# Patient Record
Sex: Female | Born: 1935
Health system: Southern US, Community
[De-identification: ages and names within clinical notes are randomized; demographics above are authoritative.]

## PROBLEM LIST (undated history)

## (undated) DIAGNOSIS — R569 Unspecified convulsions: Secondary | ICD-10-CM

## (undated) DIAGNOSIS — M5418 Radiculopathy, sacral and sacrococcygeal region: Secondary | ICD-10-CM

## (undated) DIAGNOSIS — H9190 Unspecified hearing loss, unspecified ear: Secondary | ICD-10-CM

## (undated) DIAGNOSIS — G63 Polyneuropathy in diseases classified elsewhere: Secondary | ICD-10-CM

## (undated) DIAGNOSIS — M1990 Unspecified osteoarthritis, unspecified site: Secondary | ICD-10-CM

## (undated) DIAGNOSIS — M545 Low back pain, unspecified: Secondary | ICD-10-CM

## (undated) DIAGNOSIS — D472 Monoclonal gammopathy: Secondary | ICD-10-CM

## (undated) DIAGNOSIS — G40909 Epilepsy, unspecified, not intractable, without status epilepticus: Secondary | ICD-10-CM

## (undated) DIAGNOSIS — Z87898 Personal history of other specified conditions: Secondary | ICD-10-CM

## (undated) DIAGNOSIS — R413 Other amnesia: Secondary | ICD-10-CM

## (undated) DIAGNOSIS — I10 Essential (primary) hypertension: Secondary | ICD-10-CM

## (undated) HISTORY — PX: TRABECULECTOMY: SHX107

## (undated) HISTORY — DX: Radiculopathy, sacral and sacrococcygeal region: M54.18

## (undated) HISTORY — DX: Monoclonal gammopathy: D47.2

## (undated) HISTORY — DX: Unspecified hearing loss, unspecified ear: H91.90

## (undated) HISTORY — PX: PARATHYROIDECTOMY: SHX19

## (undated) HISTORY — DX: Low back pain, unspecified: M54.50

## (undated) HISTORY — PX: BACK SURGERY: SHX140

## (undated) HISTORY — PX: APPENDECTOMY: SHX54

## (undated) HISTORY — DX: Other amnesia: R41.3

## (undated) HISTORY — DX: Unspecified osteoarthritis, unspecified site: M19.90

## (undated) HISTORY — DX: Essential (primary) hypertension: I10

## (undated) HISTORY — DX: Low back pain: M54.5

## (undated) HISTORY — DX: Epilepsy, unspecified, not intractable, without status epilepticus: G40.909

## (undated) HISTORY — DX: Polyneuropathy in diseases classified elsewhere: G63

## (undated) HISTORY — PX: OVARY SURGERY: SHX727

## (undated) HISTORY — PX: CATARACT EXTRACTION: SUR2

## (undated) HISTORY — DX: Personal history of other specified conditions: Z87.898

---
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
## 1999-01-27 ENCOUNTER — Other Ambulatory Visit: Admission: RE | Admit: 1999-01-27 | Discharge: 1999-01-27 | Payer: Self-pay | Admitting: Surgery

## 1999-02-06 ENCOUNTER — Ambulatory Visit (HOSPITAL_COMMUNITY): Admission: RE | Admit: 1999-02-06 | Discharge: 1999-02-06 | Payer: Self-pay | Admitting: Surgery

## 1999-04-06 ENCOUNTER — Other Ambulatory Visit: Admission: RE | Admit: 1999-04-06 | Discharge: 1999-04-06 | Payer: Self-pay | Admitting: Obstetrics and Gynecology

## 1999-10-02 ENCOUNTER — Encounter (INDEPENDENT_AMBULATORY_CARE_PROVIDER_SITE_OTHER): Payer: Self-pay

## 1999-10-02 ENCOUNTER — Other Ambulatory Visit: Admission: RE | Admit: 1999-10-02 | Discharge: 1999-10-02 | Payer: Self-pay | Admitting: Obstetrics and Gynecology

## 2000-01-01 ENCOUNTER — Ambulatory Visit (HOSPITAL_COMMUNITY): Admission: RE | Admit: 2000-01-01 | Discharge: 2000-01-01 | Payer: Self-pay | Admitting: Obstetrics & Gynecology

## 2001-05-19 ENCOUNTER — Other Ambulatory Visit: Admission: RE | Admit: 2001-05-19 | Discharge: 2001-05-19 | Payer: Self-pay | Admitting: Obstetrics and Gynecology

## 2001-06-26 ENCOUNTER — Encounter: Payer: Self-pay | Admitting: Family Medicine

## 2001-06-26 ENCOUNTER — Encounter: Admission: RE | Admit: 2001-06-26 | Discharge: 2001-06-26 | Payer: Self-pay | Admitting: Family Medicine

## 2003-06-15 ENCOUNTER — Encounter: Payer: Self-pay | Admitting: Family Medicine

## 2003-06-15 ENCOUNTER — Encounter: Admission: RE | Admit: 2003-06-15 | Discharge: 2003-06-15 | Payer: Self-pay | Admitting: Family Medicine

## 2003-08-20 ENCOUNTER — Encounter: Admission: RE | Admit: 2003-08-20 | Discharge: 2003-08-20 | Payer: Self-pay | Admitting: Family Medicine

## 2003-08-20 ENCOUNTER — Encounter: Payer: Self-pay | Admitting: Family Medicine

## 2003-10-07 ENCOUNTER — Encounter: Admission: RE | Admit: 2003-10-07 | Discharge: 2003-10-07 | Payer: Self-pay | Admitting: Neurosurgery

## 2003-10-08 ENCOUNTER — Other Ambulatory Visit: Admission: RE | Admit: 2003-10-08 | Discharge: 2003-10-08 | Payer: Self-pay | Admitting: Family Medicine

## 2003-10-26 ENCOUNTER — Encounter: Admission: RE | Admit: 2003-10-26 | Discharge: 2003-10-26 | Payer: Self-pay | Admitting: Neurosurgery

## 2003-12-09 ENCOUNTER — Inpatient Hospital Stay (HOSPITAL_COMMUNITY): Admission: RE | Admit: 2003-12-09 | Discharge: 2003-12-10 | Payer: Self-pay | Admitting: Neurosurgery

## 2003-12-13 ENCOUNTER — Inpatient Hospital Stay (HOSPITAL_COMMUNITY): Admission: EM | Admit: 2003-12-13 | Discharge: 2003-12-18 | Payer: Self-pay | Admitting: Neurosurgery

## 2003-12-13 ENCOUNTER — Emergency Department (HOSPITAL_COMMUNITY): Admission: EM | Admit: 2003-12-13 | Discharge: 2003-12-13 | Payer: Self-pay | Admitting: *Deleted

## 2004-10-16 ENCOUNTER — Other Ambulatory Visit: Admission: RE | Admit: 2004-10-16 | Discharge: 2004-10-16 | Payer: Self-pay | Admitting: Family Medicine

## 2006-05-14 ENCOUNTER — Encounter: Admission: RE | Admit: 2006-05-14 | Discharge: 2006-05-14 | Payer: Self-pay | Admitting: Family Medicine

## 2006-05-24 ENCOUNTER — Encounter: Admission: RE | Admit: 2006-05-24 | Discharge: 2006-05-24 | Payer: Self-pay | Admitting: Family Medicine

## 2007-08-08 ENCOUNTER — Emergency Department (HOSPITAL_COMMUNITY): Admission: EM | Admit: 2007-08-08 | Discharge: 2007-08-08 | Payer: Self-pay | Admitting: Emergency Medicine

## 2007-09-11 ENCOUNTER — Encounter: Admission: RE | Admit: 2007-09-11 | Discharge: 2007-09-11 | Payer: Self-pay | Admitting: Family Medicine

## 2007-10-13 ENCOUNTER — Ambulatory Visit: Payer: Self-pay | Admitting: Gastroenterology

## 2007-10-13 LAB — CONVERTED CEMR LAB
Ferritin: 31.5 ng/mL
Folate: >20 ng/mL
Iron: 172 ug/dL — ABNORMAL HIGH
Saturation Ratios: 68.4 % — ABNORMAL HIGH
Sed Rate: 5 mm/h
Tissue Transglutaminase Ab, IgA: 0 units (ref <7)
Transferrin: 179.7 mg/dL — ABNORMAL LOW
Vitamin B-12: 551 pg/mL

## 2007-10-16 ENCOUNTER — Ambulatory Visit (HOSPITAL_COMMUNITY): Admission: RE | Admit: 2007-10-16 | Discharge: 2007-10-16 | Payer: Self-pay | Admitting: Gastroenterology

## 2007-11-05 ENCOUNTER — Ambulatory Visit: Payer: Self-pay | Admitting: Gastroenterology

## 2007-11-10 ENCOUNTER — Ambulatory Visit: Payer: Self-pay | Admitting: Internal Medicine

## 2007-11-14 ENCOUNTER — Ambulatory Visit: Payer: Self-pay | Admitting: Gastroenterology

## 2007-11-14 LAB — CONVERTED CEMR LAB
AST: 30 units/L (ref 0–37)
Albumin: 3.3 g/dL — ABNORMAL LOW (ref 3.5–5.2)
CA 125: 13 units/mL (ref 0.0–30.2)
Total Bilirubin: 0.8 mg/dL (ref 0.3–1.2)
Total Protein: 6.2 g/dL (ref 6.0–8.3)

## 2007-12-08 ENCOUNTER — Ambulatory Visit: Payer: Self-pay | Admitting: Oncology

## 2007-12-30 LAB — CBC WITH DIFFERENTIAL/PLATELET
Basophils Absolute: 0.1 10*3/uL (ref 0.0–0.1)
EOS%: 5 % (ref 0.0–7.0)
Eosinophils Absolute: 0.3 10*3/uL (ref 0.0–0.5)
HGB: 13.2 g/dL (ref 11.6–15.9)
MONO#: 0.5 10*3/uL (ref 0.1–0.9)
NEUT#: 2.6 10*3/uL (ref 1.5–6.5)
RDW: 10.7 % — ABNORMAL LOW (ref 11.3–14.5)
WBC: 5 10*3/uL (ref 3.9–10.0)
lymph#: 1.6 10*3/uL (ref 0.9–3.3)

## 2007-12-30 LAB — MORPHOLOGY: PLT EST: ADEQUATE

## 2007-12-30 LAB — CHCC SMEAR

## 2008-01-01 ENCOUNTER — Ambulatory Visit (HOSPITAL_COMMUNITY): Admission: RE | Admit: 2008-01-01 | Discharge: 2008-01-01 | Payer: Self-pay | Admitting: Oncology

## 2008-01-02 LAB — IMMUNOFIXATION ELECTROPHORESIS: Total Protein, Serum Electrophoresis: 6.5 g/dL (ref 6.0–8.3)

## 2008-01-02 LAB — KAPPA/LAMBDA LIGHT CHAINS: Kappa:Lambda Ratio: 0.39 (ref 0.26–1.65)

## 2008-01-06 LAB — CRYOGLOBULIN

## 2008-01-22 DIAGNOSIS — K648 Other hemorrhoids: Secondary | ICD-10-CM | POA: Insufficient documentation

## 2008-01-22 DIAGNOSIS — G40401 Other generalized epilepsy and epileptic syndromes, not intractable, with status epilepticus: Secondary | ICD-10-CM

## 2008-01-22 DIAGNOSIS — M1990 Unspecified osteoarthritis, unspecified site: Secondary | ICD-10-CM

## 2008-01-22 DIAGNOSIS — R630 Anorexia: Secondary | ICD-10-CM | POA: Insufficient documentation

## 2008-01-22 DIAGNOSIS — I10 Essential (primary) hypertension: Secondary | ICD-10-CM | POA: Insufficient documentation

## 2008-01-22 DIAGNOSIS — J449 Chronic obstructive pulmonary disease, unspecified: Secondary | ICD-10-CM | POA: Insufficient documentation

## 2008-01-22 DIAGNOSIS — M810 Age-related osteoporosis without current pathological fracture: Secondary | ICD-10-CM

## 2008-02-22 IMAGING — CR DG BONE SURVEY MET
10 series · 10 of 10 positions shown · non-contrast
Comparison: None.

CLINICAL DATA: Myoclonal paraproteinemia. 
 METASTATIC BONE SURVEY ? 17 VIEW:

[w c-spine lat *]
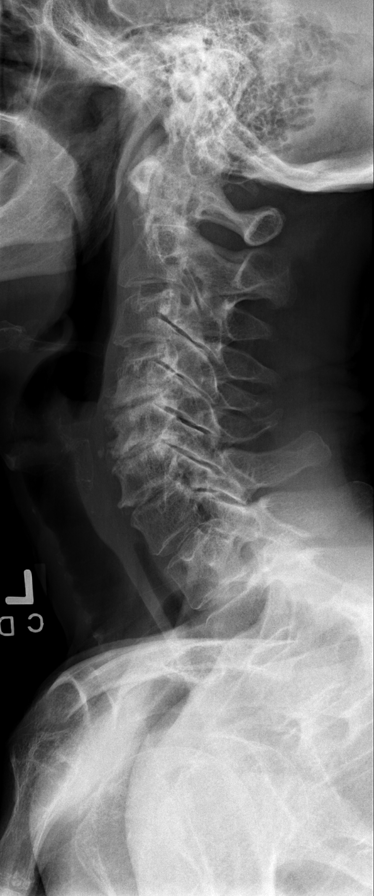

[w skull lat *]
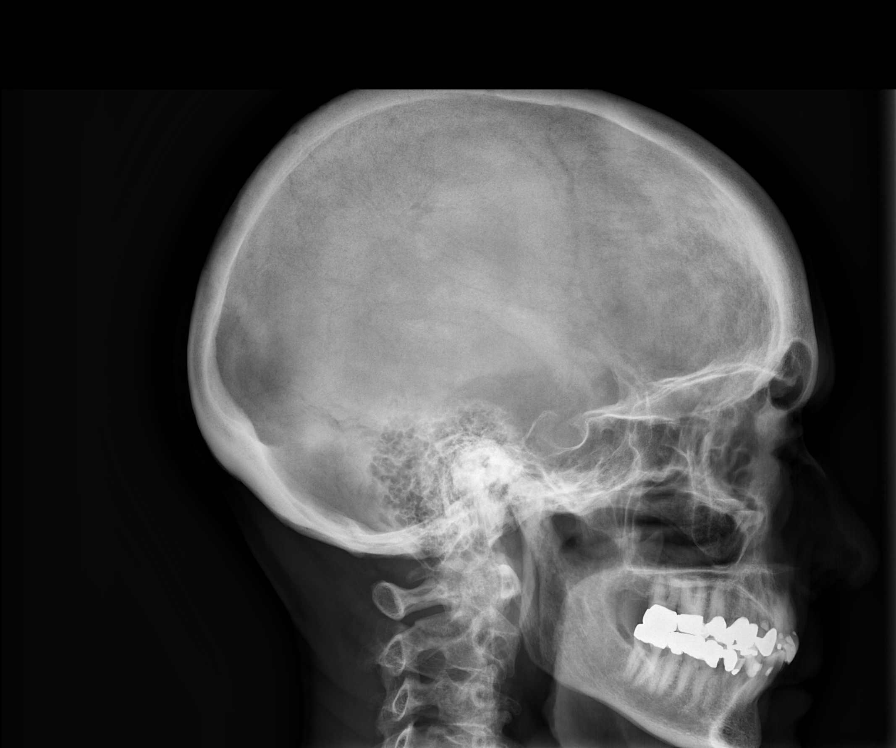

[t c-spine a.p.]
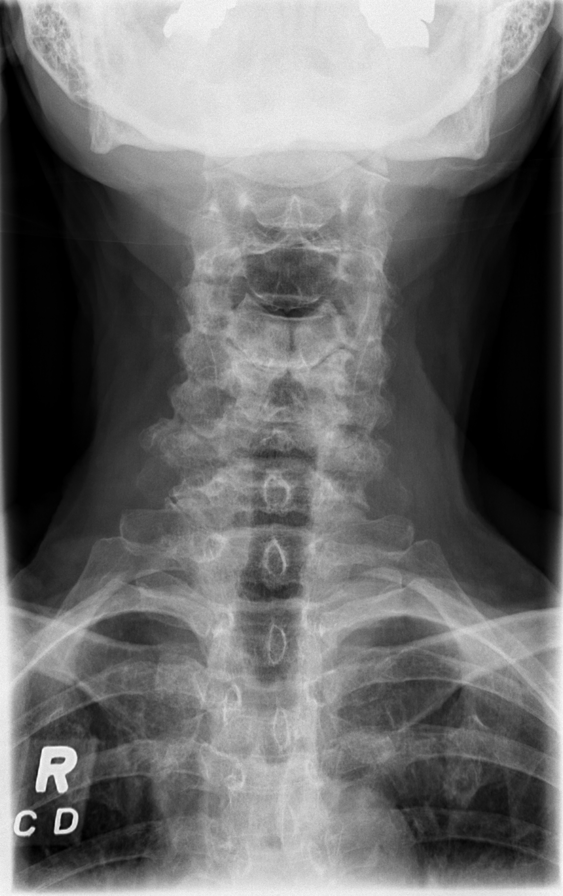

[t t-spine a.p.]
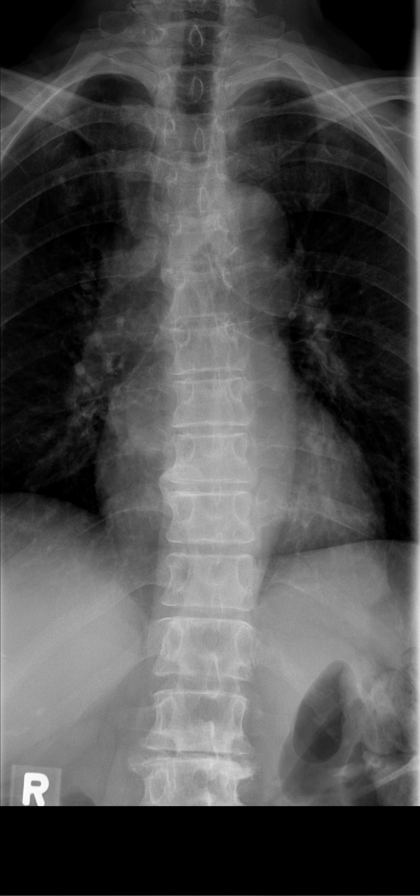

[t l-spine a.p.]
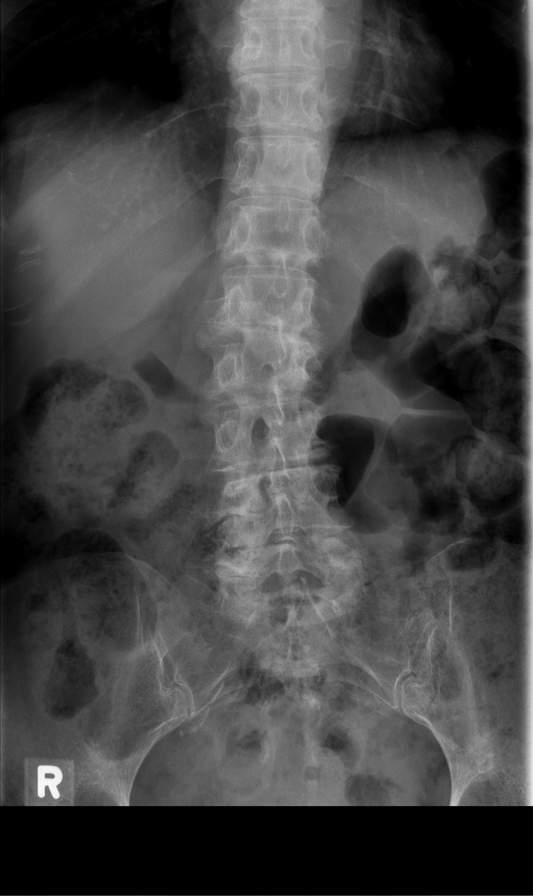

[t pelvis a.p.]
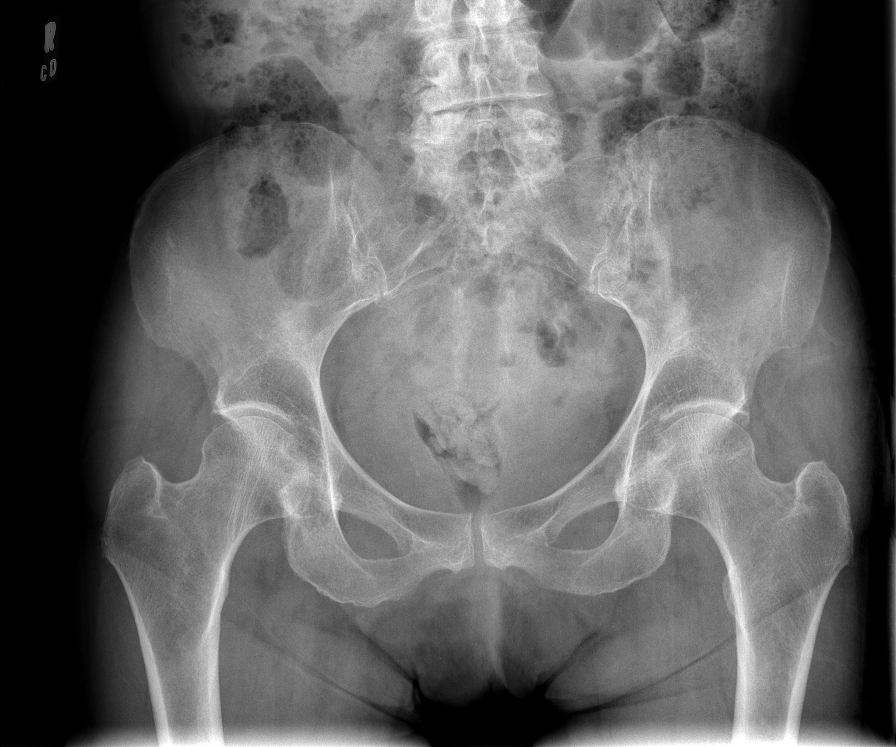

[t femur with hip  ap left (1 of 2)]
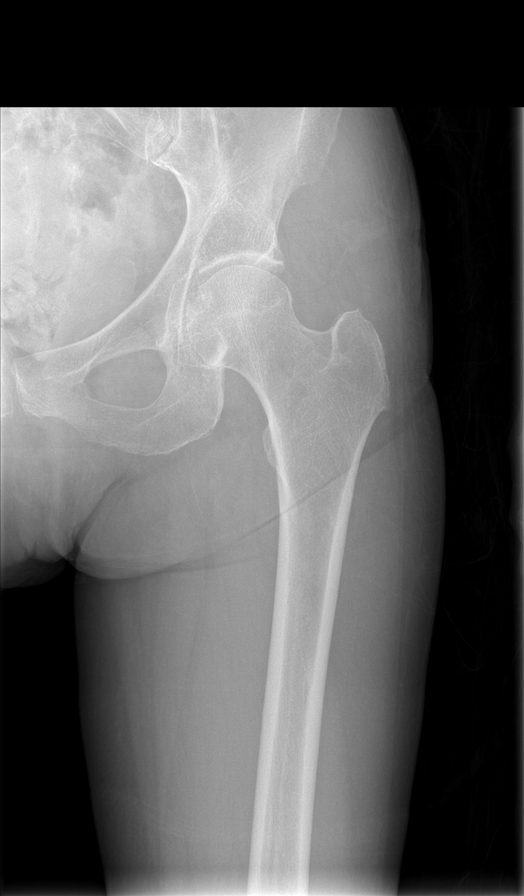

[t femur with hip  ap left (2 of 2)]
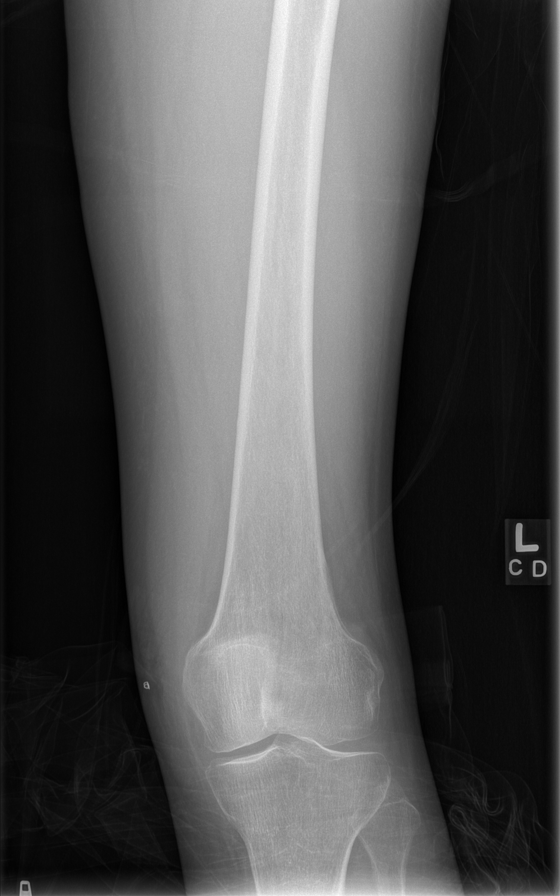

[t femur with hip  ap right (1 of 2)]
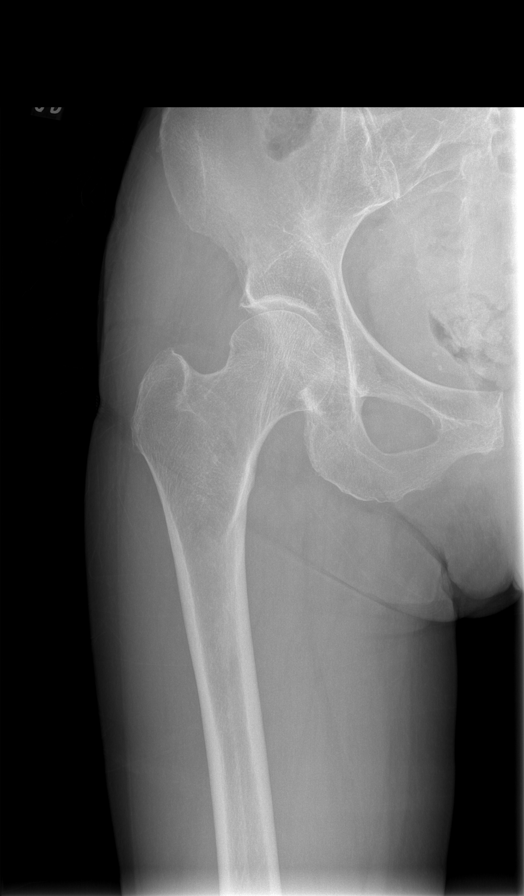

[t femur with hip  ap right (2 of 2)]
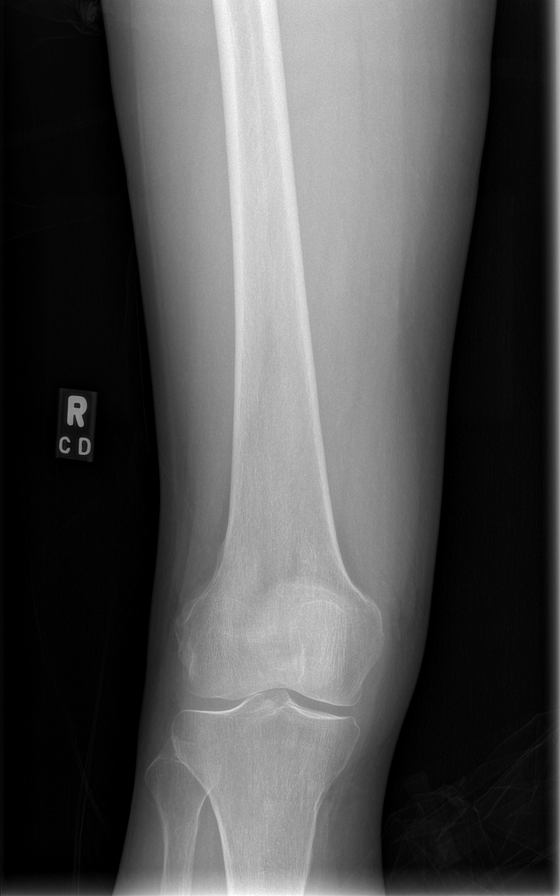

[10 of 10 positions shown; findings below may reference images not displayed]

FINDINGS: There are advanced degenerative changes of the mid cervical spine.  Multilevel degenerative disk disease in the lumbar region.  There are also some degenerative changes of the T-spine.  There are no definite vertebral lesions. 
 No lytic lesions in the skull, bony pelvis, or long bones.  The bones are generally demineralized, consistent with age and gender.
IMPRESSION: 1.  No changes of myeloma or metastatic disease. 
 2.  Moderately advanced degenerative changes of the cervical and lumbar spine.

## 2009-12-07 ENCOUNTER — Encounter: Admission: RE | Admit: 2009-12-07 | Discharge: 2009-12-07 | Payer: Self-pay | Admitting: Family Medicine

## 2010-06-02 ENCOUNTER — Encounter (HOSPITAL_COMMUNITY): Admission: RE | Admit: 2010-06-02 | Discharge: 2010-08-09 | Payer: Self-pay | Admitting: Family Medicine

## 2010-08-21 ENCOUNTER — Ambulatory Visit (HOSPITAL_COMMUNITY): Admission: RE | Admit: 2010-08-21 | Discharge: 2010-08-21 | Payer: Self-pay | Admitting: Surgery

## 2011-02-15 LAB — URINALYSIS, ROUTINE W REFLEX MICROSCOPIC
Bilirubin Urine: NEGATIVE
Glucose, UA: NEGATIVE mg/dL
Hgb urine dipstick: NEGATIVE
Protein, ur: NEGATIVE mg/dL
Specific Gravity, Urine: 1.011 (ref 1.005–1.030)

## 2011-02-15 LAB — CBC
Hemoglobin: 12.9 g/dL (ref 12.0–15.0)
MCH: 36.2 pg — ABNORMAL HIGH (ref 26.0–34.0)
MCHC: 36 g/dL (ref 30.0–36.0)
MCV: 100.6 fL — ABNORMAL HIGH (ref 78.0–100.0)
RBC: 3.58 MIL/uL — ABNORMAL LOW (ref 3.87–5.11)

## 2011-02-15 LAB — BASIC METABOLIC PANEL
CO2: 29 mEq/L (ref 19–32)
Chloride: 104 mEq/L (ref 96–112)
GFR calc Af Amer: >60 mL/min (ref >60)
Glucose, Bld: 83 mg/dL (ref 70–99)
Sodium: 137 mEq/L (ref 135–145)

## 2011-02-15 LAB — DIFFERENTIAL
Basophils Relative: 1 % (ref 0–1)
Eosinophils Absolute: 0.3 10*3/uL (ref 0.0–0.7)
Eosinophils Relative: 7 % — ABNORMAL HIGH (ref 0–5)
Lymphs Abs: 1.2 10*3/uL (ref 0.7–4.0)
Monocytes Absolute: 0.4 10*3/uL (ref 0.1–1.0)
Monocytes Relative: 9 % (ref 3–12)

## 2011-02-15 LAB — SURGICAL PCR SCREEN: Staphylococcus aureus: NEGATIVE

## 2011-02-28 ENCOUNTER — Emergency Department (HOSPITAL_COMMUNITY): Payer: Medicare Other

## 2011-02-28 ENCOUNTER — Encounter (HOSPITAL_COMMUNITY): Payer: Self-pay

## 2011-02-28 ENCOUNTER — Emergency Department (HOSPITAL_COMMUNITY)
Admission: EM | Admit: 2011-02-28 | Discharge: 2011-02-28 | Disposition: A | Discharge status: Home / Self Care | Payer: Medicare Other | Attending: Emergency Medicine | Admitting: Emergency Medicine

## 2011-02-28 DIAGNOSIS — R569 Unspecified convulsions: Secondary | ICD-10-CM | POA: Insufficient documentation

## 2011-02-28 DIAGNOSIS — I498 Other specified cardiac arrhythmias: Secondary | ICD-10-CM | POA: Insufficient documentation

## 2011-02-28 DIAGNOSIS — F29 Unspecified psychosis not due to a substance or known physiological condition: Secondary | ICD-10-CM | POA: Insufficient documentation

## 2011-02-28 DIAGNOSIS — H409 Unspecified glaucoma: Secondary | ICD-10-CM | POA: Insufficient documentation

## 2011-02-28 DIAGNOSIS — R404 Transient alteration of awareness: Secondary | ICD-10-CM | POA: Insufficient documentation

## 2011-02-28 HISTORY — DX: Unspecified convulsions: R56.9

## 2011-02-28 LAB — COMPREHENSIVE METABOLIC PANEL
CO2: 25 mEq/L (ref 19–32)
Calcium: 8.2 mg/dL — ABNORMAL LOW (ref 8.4–10.5)
Creatinine, Ser: 0.89 mg/dL (ref 0.4–1.2)
GFR calc Af Amer: >60 mL/min (ref >60)
GFR calc non Af Amer: >60 mL/min (ref >60)
Glucose, Bld: 139 mg/dL — ABNORMAL HIGH (ref 70–99)
Total Protein: 5.6 g/dL — ABNORMAL LOW (ref 6.0–8.3)

## 2011-02-28 LAB — CBC
HCT: 33.5 % — ABNORMAL LOW (ref 36.0–46.0)
Hemoglobin: 11.3 g/dL — ABNORMAL LOW (ref 12.0–15.0)
MCH: 31 pg (ref 26.0–34.0)
MCHC: 33.7 g/dL (ref 30.0–36.0)
RDW: 14.3 % (ref 11.5–15.5)

## 2011-02-28 LAB — URINALYSIS, ROUTINE W REFLEX MICROSCOPIC
Glucose, UA: NEGATIVE mg/dL
Nitrite: NEGATIVE
Specific Gravity, Urine: 1.018 (ref 1.005–1.030)
pH: 6 (ref 5.0–8.0)

## 2011-02-28 LAB — DIFFERENTIAL
Eosinophils Relative: 4 % (ref 0–5)
Lymphocytes Relative: 17 % (ref 12–46)
Lymphs Abs: 1 10*3/uL (ref 0.7–4.0)
Monocytes Absolute: 0.4 10*3/uL (ref 0.1–1.0)
Monocytes Relative: 6 % (ref 3–12)

## 2011-04-17 NOTE — Assessment & Plan Note (Signed)
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 Maurice Small  for evaluation of anorexia and weight loss.   Tracy Nielsen, over the last 6 months, has had anorexia and a 20-pound  weight loss.  She denies any other GI complaints.  Specifically, she  denies reflux symptoms, dysphagia, clay-colored stools, dark urine,  icterus, change in bowel habits, melena, hematochezia, or abdominal  pain.  She is under a lot of personal stress and eats on the run most  of the day.  She has had no real change in her dietary habits or new  medications, except for benazepril daily for hypertension, which  apparently is new onset.   I have seen Tracy Nielsen in the past for colonoscopy exam because of a history  of colon cancer in her sister, who died at age 61.  She has had several  colonoscopies, the last one performed in February of 2004, which was  unremarkable.  She does have hemorrhoids and previously had some  hemorrhoidal-type bleeding.  On reviewing her chart, she does have a  very long and redundant and spastic colon, and this was confirmed by  barium enema that was performed by Dr. Clyda Greener on May 24, 2006.  At that time, Tracy Nielsen was having some nonspecific left lower  quadrant pain.  She also had chest x-ray in October of this year that  showed some mild COPD with surgical clips at the left breast, but  otherwise was unremarkable.  Laboratory data showed normal CBC,  metabolic profile, and TSH level.   The patient's only other complaint at this time is occasional falling  but she denies any specific neurological complaints.  She is followed by  Dr. Melbourne Abts and did have  a grand mal seizure 25 years ago, and has been  on dilantin in questionable dosage daily.  She has an appointment to see  Dr. Sandria Manly in early December.  The patient also, in the past, has had  bilateral oophorectomy in 1966 for benign cystic disease, and has had a  previous appendectomy, but has not had a cholecystectomy.   PAST MEDICAL HISTORY:  Otherwise, the patient has had new onset  hypertension and does have some degenerative arthritis, but denies abuse  of NSAID.   MEDICATIONS:  1. Benazepril questionable dose daily.  2. Dilantin questionable dose daily.  3. Variety of glaucoma eye drops.  4. Multivitamins.  5. Calcium with vitamin D twice a day.  6. Glucosamine with chondroitin daily.  7. Aspirin 81 mg a day.   She denies drug allergies.   FAMILY HISTORY:  Remarkable for colon cancer in her sister who died at  age 19.  Her mother had breast cancer, but died at age 72.   SOCIAL HISTORY:  She is married and lives with her husband.  She has 4  children  and has 2 grandchildren who have muscular dystrophy and are  very ill, which causes her a lot of personal stress.  She has 2 years of  college education and is retired from the travel industry.  She quit  smoking in 1974 and uses 2 glasses of wine a day, but problems with  alcohol abuse or dependency.   REVIEW OF SYSTEMS:  Otherwise, noncontributory.  She denies specific  other neurological problems, except for numbness in her left foot.  She  has had no ataxia, dizziness, or seizures in the last 25 years.  She  denies chest pain with exertion, palpitations, but says she has had a  recent cardiac workup that was negative.  She denies any other problems,  except for benign cyst in the mid part of her upper abdomen, which  apparently in the past was followed by Dr. Irven Shelling.  Review of  systems otherwise noncontributory.   EXAMINATION:  She is a healthy-appearing white female in no distress  appearing her stated age.  She  is 5 feet 4 inches tall and weighs 108 pounds.  Blood pressure  122/60, pulse 88 and regular.  I could not appreciate stigmata of chronic liver disease.  There is no  thyromegaly or lymphadenopathy noted.  CHEST:  Clear without wheezes or rhonchi.  She appeared to be in a regular rhythm without murmurs, gallops, or  rubs.  There was a cystic lesion in the epigastric area that was nontender.  She did have a diastasis deformity of her abdomen with straight leg  testing.  I could palpate some intestinal loops in her right lower  quadrant area, but these did not appear to be fixed or tender.  Otherwise, exam is unremarkable without organomegaly, or evidence of  ascites.  Bowel sounds were normal and I could not appreciate any bruit.  Peripheral extremities were unremarkable.  Mental status was normal.  There were no gross neurologic deficits.  RECTAL:  Deferred.  The patient relates she had done Hemoccult cards  within the last year that were negative.   ASSESSMENT:  1. Anorexia and weight loss of unexplained etiology, which may well be      stress-related, although the patient does not have a classic      anxiety syndrome, and she denies any chronic depression      symptomatology or history.  She is sleeping well at night and      otherwise denies panic attacks, et Karie Soda.  2. Rule out chronic malabsorption syndrome, although I think this is      highly unlikely.  However, the patient does have chronic      osteoporosis.  3. Strong family history of colon carcinoma with need for scheduled      colonoscopy followup.  4. History of previous bilateral oophorectomy for benign cystic      disorder of her ovaries.  5. History of idiopathic seizure disorder, on dilantin.  6. History of osteoporosis.  7. New onset hypertension.  8. History of degenerative arthritis.   RECOMMENDATIONS:  1. Check sprue panel, serum carotene, serum trypsin level, and sed      rate.  2. Check upper abdominal  ultrasound exam.  3. Outpatient colonoscopy scheduled.  4. Continue other multiple medications per Dr. Valentina Lucks.  5. I have urged her to keep her neurological appointment with Dr. Sandria Manly      as scheduled.     Vania Rea. Jarold Motto, MD, Caleen Essex, FAGA  Electronically Signed  DRP/MedQ  DD: 10/13/2007  DT: 10/13/2007  Job #: [id number]   cc:   Gretta Arab. Valentina Lucks, M.D.  Genene Churn. [facility], M.D.

## 2011-04-20 NOTE — Discharge Summary (Signed)
NAME:  Tracy Nielsen, Tracy Nielsen                      ACCOUNT NO.:  [id number]   MEDICAL RECORD NO.:  [id number]                   PATIENT TYPE:  INP   LOCATION:  3035                                 FACILITY:  [facility]   PHYSICIAN:  Danae Orleans. Venetia Maxon, M.D.               DATE OF BIRTH:  [birth year]   DATE OF ADMISSION:  12/13/2003  DATE OF DISCHARGE:  12/18/2003                                 DISCHARGE SUMMARY   REASON FOR ADMISSION:  Severe low back pain and muscular spasm following  lumbar decompressive laminectomy and foraminotomy at L2-3 and L3-4 levels on  the right.   FINAL DIAGNOSES:  Severe low back pain and muscular spasm following lumbar  decompressive laminectomy and foraminotomy at L2-3 and L3-4 levels on the  right.   HISTORY OF PRESENT ILLNESS:  The patient was admitted on December 13, 2003,  after having undergone recent decompressive lumbar procedure for low back  and right lower extremity pain.  She had an MRI which demonstrated a small  amount of fluid at the L3-4 level on the right without other significant  abnormalities.  She had a SED rate of 55, normal white blood cell count and  she was afebrile.  She was observed in the hospital, was given Valium and  pain medications.  PCA was started on Decadron and did better with that with  relief of her leg pain.  She was gradually mobilized with physical therapy  and was doing better on January 15, at which point she was discharged home  in stable and satisfactory condition having tolerated her hospitalization  well.  Follow up is instructed in two weeks with Dr. Venetia Maxon in the office.   DISCHARGE MEDICATIONS:  1. Percocet.  2. Valium.  3. Steroid tapers as previously prescribed.                                                Danae Orleans. Venetia Maxon, M.D.    JDS/MEDQ  D:  12/18/2003  T:  12/18/2003  Job:  [id number]

## 2011-04-20 NOTE — Op Note (Signed)
NAME:  STORIE, HEFFERN                      ACCOUNT NO.:  [id number]   MEDICAL RECORD NO.:  [id number]                   PATIENT TYPE:  INP   LOCATION:  2899                                 FACILITY:  [facility]   PHYSICIAN:  Danae Orleans. Venetia Maxon, M.D.               DATE OF BIRTH:  1930/04/05   DATE OF PROCEDURE:  12/09/2003  DATE OF DISCHARGE:                                 OPERATIVE REPORT   PREOPERATIVE DIAGNOSIS:  Spondylosis, stenosis, degenerative disc disease,  lumbar radiculopathy, L2-L3 and L3-L4 right.   POSTOPERATIVE DIAGNOSIS:  Spondylosis, stenosis, degenerative disc disease,  lumbar radiculopathy, L2-L3 and L3-L4 right.   PROCEDURE:  Right L2-L3 and L3-L4 foraminotomies with microdissection.   SURGEON:  Danae Orleans. Venetia Maxon, M.D.   ASSISTANT:  Clydene Fake, M.D.   ANESTHESIA:  General endotracheal anesthesia.   ESTIMATED BLOOD LOSS:  Minimal.   COMPLICATIONS:  None.   DISPOSITION:  Recovery room.   INDICATIONS FOR PROCEDURE:  Tracy Nielsen is a 77 year old woman with  right foraminal stenosis of L2-L3 and L3-L4 levels with significant right  lower extremity pain worse with standing and ambulation.  It was elected to  take her to surgery for decompression at these affected levels.   PROCEDURE:  Ms. Justo was brought to the operating room.  Following  satisfactory uncomplicated induction of general endotracheal anesthesia and  placement of intravenous  lines, the patient was placed in a prone position  on the operating table on the Wilson frame.  Her low back was then prepped  and draped in the usual sterile fashion.  The area of planned incision was  infiltrated with 0.25% Marcaine and 0.5% lidocaine with 1:200,000  epinephrine.  An incision was made in the midline and carried through the  adipost tissue to the lumbodorsal fascia which was incised on the right side  of midline exposing the L2-L3 and L3-L4 interspaces using subperiosteal  dissection.  McCullough  retractors were placed to facilitate exposure.  An  interoperative x-ray confirmed correct orientation at the L3-L4 and L2-LL3  levels.  Using loupe magnification and a high speed drill, hemilaminotomy of  L2 and L3 were then performed on the right.  Copious redundant ligamentum  flavum was then detached and removed in piecemeal fashion.  The lateral  aspect of the thecal sac was decompressed at each level.  The microscope was  brought onto the field and foraminotomies were completed with bone removal  overlying the L3 nerve root and lateral recess of the spinal canal was  decompressed.  There was significant redundant hypertrophied ligamentum  flavum on the right at the L3-L4 level.  A similar decompression was then  performed at the L2-L3 level.  The coronary dilator was easily inserted up  the neural foramen and along the lateral aspect of the thecal sac at each of  these operative levels.  The nerve roots appeared to be well decompressed.  The thecal sac  did not appear to be injured and there was no evidence of any  CSF leak.  Hemostasis was obtained with Gelfoam soaked in thrombin.  The  wound was copiously irrigated with Bacitracin saline.  The lumbodorsal  fascia was closed with 0 Vicryl sutures, the subcutaneous tissues were  reapproximated with 2-0 Vicryl interrupted inverted sutures, and the skin  edges were reapproximated with interrupted 3-0 Vicryl subcuticular stitch.  The wound was dressed with Dermabond.  The patient was extubated in the  operating room and taken to the recovery room in stable condition having  tolerated the operation well.  Counts were correct at the end of the case.                                               Danae Orleans. Venetia Maxon, M.D.    JDS/MEDQ  D:  12/09/2003  T:  12/09/2003  Job:  [id number]

## 2011-12-12 DIAGNOSIS — H5710 Ocular pain, unspecified eye: Secondary | ICD-10-CM | POA: Insufficient documentation

## 2012-05-07 DIAGNOSIS — H02409 Unspecified ptosis of unspecified eyelid: Secondary | ICD-10-CM | POA: Insufficient documentation

## 2012-05-07 DIAGNOSIS — H04123 Dry eye syndrome of bilateral lacrimal glands: Secondary | ICD-10-CM | POA: Insufficient documentation

## 2012-11-04 DIAGNOSIS — IMO0002 Reserved for concepts with insufficient information to code with codable children: Secondary | ICD-10-CM | POA: Insufficient documentation

## 2012-11-04 DIAGNOSIS — G544 Lumbosacral root disorders, not elsewhere classified: Secondary | ICD-10-CM | POA: Insufficient documentation

## 2012-11-04 DIAGNOSIS — R269 Unspecified abnormalities of gait and mobility: Secondary | ICD-10-CM | POA: Insufficient documentation

## 2012-11-04 DIAGNOSIS — G609 Hereditary and idiopathic neuropathy, unspecified: Secondary | ICD-10-CM | POA: Insufficient documentation

## 2012-11-04 DIAGNOSIS — H53139 Sudden visual loss, unspecified eye: Secondary | ICD-10-CM | POA: Insufficient documentation

## 2012-11-04 DIAGNOSIS — F29 Unspecified psychosis not due to a substance or known physiological condition: Secondary | ICD-10-CM | POA: Insufficient documentation

## 2012-11-04 DIAGNOSIS — G40309 Generalized idiopathic epilepsy and epileptic syndromes, not intractable, without status epilepticus: Secondary | ICD-10-CM | POA: Insufficient documentation

## 2013-04-22 ENCOUNTER — Telehealth: Payer: Self-pay | Admitting: *Deleted

## 2013-05-18 ENCOUNTER — Other Ambulatory Visit: Payer: Self-pay | Admitting: Neurology

## 2013-05-18 NOTE — Telephone Encounter (Signed)
Former [facility] patient, has not been assigned new provider.  Auth refill via WID 

## 2013-05-25 NOTE — Telephone Encounter (Signed)
Appointment made

## 2013-06-20 ENCOUNTER — Other Ambulatory Visit: Payer: Self-pay | Admitting: Neurology

## 2013-06-29 ENCOUNTER — Encounter: Payer: Self-pay | Admitting: Neurology

## 2013-06-29 DIAGNOSIS — IMO0002 Reserved for concepts with insufficient information to code with codable children: Secondary | ICD-10-CM

## 2013-06-29 DIAGNOSIS — G544 Lumbosacral root disorders, not elsewhere classified: Secondary | ICD-10-CM

## 2013-06-29 DIAGNOSIS — G609 Hereditary and idiopathic neuropathy, unspecified: Secondary | ICD-10-CM

## 2013-06-29 DIAGNOSIS — R269 Unspecified abnormalities of gait and mobility: Secondary | ICD-10-CM

## 2013-06-29 DIAGNOSIS — G40309 Generalized idiopathic epilepsy and epileptic syndromes, not intractable, without status epilepticus: Secondary | ICD-10-CM

## 2013-06-29 DIAGNOSIS — F29 Unspecified psychosis not due to a substance or known physiological condition: Secondary | ICD-10-CM

## 2013-06-30 ENCOUNTER — Encounter: Payer: Self-pay | Admitting: Neurology

## 2013-06-30 ENCOUNTER — Ambulatory Visit (INDEPENDENT_AMBULATORY_CARE_PROVIDER_SITE_OTHER): Payer: Medicare Other | Admitting: Neurology

## 2013-06-30 VITALS — BP 122/66 | HR 70 | Ht 63.0 in | Wt 124.0 lb

## 2013-06-30 DIAGNOSIS — G40309 Generalized idiopathic epilepsy and epileptic syndromes, not intractable, without status epilepticus: Secondary | ICD-10-CM

## 2013-06-30 DIAGNOSIS — G609 Hereditary and idiopathic neuropathy, unspecified: Secondary | ICD-10-CM

## 2013-06-30 MED ORDER — LAMOTRIGINE 100 MG PO TABS: ORAL_TABLET | ORAL | Status: DC

## 2013-06-30 NOTE — Progress Notes (Signed)
Reason for visit: Seizures  Tracy Nielsen is an 79 y.o. female  History of present illness:  [name] is an 79 year old right-handed white female with a history of a seizure disorder, well controlled on Lamictal. The patient uses a generic medication. The patient also has a peripheral neuropathy, and she indicates that her symptoms have worsened slightly over time. The patient will have some burning sensations in the feet at night, and when she relaxes during the day. The patient denies any significant gait instability, and she has had no recent falls. The patient has an MGUS , and this could be the source of her peripheral neuropathy. The patient has also had a lumbosacral laminectomy in the past. The patient returns for an evaluation. There have been no further seizures since last seen. The last seizure was in 2012, in March of that year. The patient had mistakenly come off of her medication for one week prior to the seizure. 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  Past Medical History  Diagnosis Date  . Glaucoma   . Seizures   . Low back pain   . Radiculopathy of sacral and sacrococcygeal region     S1  . History of abnormal weight loss   . Hearing deficit     Hearing aids  . MGUS (monoclonal gammopathy of unknown significance)     Past Surgical History  Procedure Laterality Date  . Back surgery    . Ovary surgery    . Appendectomy    . Parathyroidectomy  9/  . Trabeculectomy Left   . Cataract extraction Left     Family History  Problem Relation Age of Onset  . Parkinsonism Father   . Colon cancer Sister   . Multiple sclerosis Daughter   . Muscular dystrophy Grandchild   . Muscular dystrophy Grandchild     Social history:  reports that she quit smoking about 39 years ago. She does not have any smokeless tobacco history on file. She reports that  drinks alcohol. She reports that she does not use illicit drugs.  Allergies: No Known  Allergies  Medications:  Current Outpatient Prescriptions on File Prior to Visit  Medication Sig Dispense Refill  . benazepril-hydrochlorthiazide (LOTENSIN HCT) 10-12.5 MG per tablet Take 1 tablet by mouth daily.      . Cholecalciferol (VITAMIN D) 2000 UNITS CAPS Take 2,000 Units by mouth daily.      . fish oil-omega-3 fatty acids 1000 MG capsule Take 2 g by mouth daily.      Marland Kitchen glucosamine-chondroitin 500-400 MG tablet Take 1 tablet by mouth 2 (two) times daily.      . Multiple Vitamin (MULTIVITAMIN) tablet Take 1 tablet by mouth daily.      Bertram Gala Glycol-Propyl Glycol (SYSTANE) 0.4-0.3 % SOLN Apply 1 drop to eye as needed (One drop left eye as needed).       No current facility-administered medications on file prior to visit.    ROS:  Out of a complete 14 system review of symptoms, the patient complains only of the following symptoms, and all other reviewed systems are negative.  Blurred vision Hearing loss Numbness, pain in the feet  Blood pressure 122/66, pulse 70, height 5\' 3"  (1.6 m), weight 124 lb (56.246 kg).  Physical Exam  General: The patient is alert and cooperative at the time of the examination.  Skin: No significant peripheral edema is noted.   Neurologic Exam  Cranial nerves: Facial symmetry is not present. The  patient has ptosis of the left eye. Speech is normal, no aphasia or dysarthria is noted. Extraocular movements are full. Visual fields are full.  Motor: The patient has good strength in all 4 extremities.  Coordination: The patient has good finger-nose-finger and heel-to-shin bilaterally.  Gait and station: The patient has a normal gait. Tandem gait is minimally unsteady. Romberg is negative. No drift is seen.  Reflexes: Deep tendon reflexes are symmetric.   Assessment/Plan:  1. History seizures disorder, well controlled  2. Peripheral neuropathy  The patient will continue on gabapentin at this time. The patient last had blood work in  December 2013 for the lamotrigine level, which was 3.6. The patient was given a prescription for her Lamictal, and she will followup in one year. The patient had routine blood work done through her primary care physician within the last 6-8 weeks.  Tracy Palau MD 06/30/2013 12:34 PM  [facility] [address] [place], [place] [address]  Phone [phone] Fax [phone]

## 2013-07-28 ENCOUNTER — Other Ambulatory Visit: Payer: Self-pay | Admitting: Neurology

## 2013-07-28 NOTE — Telephone Encounter (Signed)
12 refills were sent to the pharmacy on 06/30/2013 E-Prescribing Status: Receipt confirmed by pharmacy (06/30/2013 12:17 PM EDT)

## 2013-08-21 DIAGNOSIS — Z961 Presence of intraocular lens: Secondary | ICD-10-CM | POA: Insufficient documentation

## 2013-12-25 DIAGNOSIS — H26499 Other secondary cataract, unspecified eye: Secondary | ICD-10-CM | POA: Insufficient documentation

## 2014-01-07 ENCOUNTER — Encounter: Payer: Self-pay | Admitting: Neurology

## 2014-01-07 ENCOUNTER — Ambulatory Visit (INDEPENDENT_AMBULATORY_CARE_PROVIDER_SITE_OTHER): Payer: Medicare Other | Admitting: Neurology

## 2014-01-07 VITALS — BP 106/68 | HR 68 | Temp 98.0°F | Resp 18 | Ht 63.0 in | Wt 124.8 lb

## 2014-01-07 DIAGNOSIS — G40309 Generalized idiopathic epilepsy and epileptic syndromes, not intractable, without status epilepticus: Secondary | ICD-10-CM

## 2014-01-07 DIAGNOSIS — G609 Hereditary and idiopathic neuropathy, unspecified: Secondary | ICD-10-CM

## 2014-01-07 NOTE — Patient Instructions (Signed)
1. Continue taking Lamotrigine 100mg  1 tab in morning, 1-1/2 tabs at night 2. Follow-up in 6 months 3. Preventing Falls in the Home   Falls are common, often dreaded events in the lives of older people. Aside from the obvious injuries and even death that may result, falls can cause wide-ranging consequences including loss of independence, mental decline, decreased activity, and mobility. Younger people are also at risk of falling, especially those with chronic illnesses and fatigue.  Ways to reduce the risk for falling:  * Examine diet and medications. Warm foods and alcohol dilate blood vessels, which can lead to dizziness when standing. Sleep aids, antidepressants, and pain medications can also increase the likelihood of a fall.  * Get a vison exam. Poor vision, cataracts, and glaucoma increase the chances of falling.  * Check foot gear. Shoes should fit snugly and have a sturdy, nonskid sole and broad, low heel.  * Participate in a physician-approved exercise program to build and maintain muscle strength and improve balance and coordination.  * Increase vitamin D intake. Vitamin D improves muscle strength and increases the amount of calcium the body is able to absorb and deposit in bones.  How to prevent falls from common hazards:  * Floors - Remove all loose wires, cords, and throw rugs. Minimize clutter. Make sure rugs are anchored and smooth. Keep furniture in its usual place.  * Chairs - Use chairs with straight backs, armrests, and firm seats. Add firm cushions to existing pieces to add height.  * Bathroom - Install grab bars and non-skid tape in the tub or shower. Use a bathtub transfer bench or a shower chair with a back support. Use an elevated toilet seat and/or safety rails to assist standing from a low surface. Do not use towel racks or bathroom tissue holders to help you stand.  * Lighting - Make sure halls, stairways, and entrances are well-lit. Install a night light in your bathroom  or hallway. Make sure there is a light switch at the top and bottom of the staircase. Turn lights on if you get up in the middle of the night. Make sure lamps or light switches are within reach of the bed if you have to get up during the night.  * Kitchen - Install non-skid rubber mats near the sink and stove. Clean spills immediately. Store frequently used utensils, pots, and pans between waist and eye level. This helps prevent reaching and bending. Sit when getting things out of the lower cupboards.  * Living room / Sunrise furniture with wide spaces in between, giving enough room to move around. Establish a route through the living room that gives you something to hold onto as you walk.  * Stairs - Make sure treads, rails, and rugs are secure. Install a rail on both sides of the stairs. If stairs are a threat, it might be helpful to arrange most of your activities on the lower level to reduce the number of times you must climb the stairs.  * Entrances and doorways - Install metal handles on the walls adjacent to the doorknobs of all doors to make it more secure as you travel through the doorway.  Tips for maintaining balance:  * Keep at least one hand free at all times Try using a backpack or fanny pack to hold things rather than carrying them in your hands. Never carry objects in both hands when walking as this interferes with keeping your balance.  * Attempt to swing  both arms from front to back while walking. This might require a conscious effort if Parkinson's disease has diminished your movement. It will, however, help you to maintain balance and posture, and reduce fatigue.  * Consciously lift your feet off the ground when walking. Shuffling and dragging of the feet is a common culprit in losing your balance.  * When trying to navigate turns, use a "U" technique of facing forward and making a wide turn, rather than pivoting sharply.  * Try to stand with your feet shoulder-length apart.  When your feet are close together for any length of time, you increase your risk of losing your balance and falling.  * Do one thing at a time. Do not try to walk and accomplish another task, such as reading or looking around. The decrease in your automatic reflexes complicates motor function, so the less distraction, the better.  * Do not wear rubber or gripping soled shoes, they might "catch" on the floor and cause tripping.  * Move slowly when changing positions. Use deliberate, concentrated movements and, if needed, use a grab bar or walking aid. Count fifteen (15) seconds after standing to begin walking.  * If balance is a continuous problem, you might want to consider a walking aid such as a cane, walking stick, or walker. Once you have mastered walking with help, you may be ready to try it again on your own.  This information is provided by [facility] and is not intended to replace the medical advice of your physician or other health care providers. Please consult your physician or other health care providers for advice regarding your specific medical condition.

## 2014-01-07 NOTE — Progress Notes (Signed)
NEUROLOGY CONSULTATION NOTE  Tracy Nielsen MRN: [id number] DOB: 02/04/1950  Referring provider: Dr. Kelton Pillar Primary care provider: Dr. Kelton Pillar  Reason for consult:  Establish care for seizures  Dear Dr Laurann Montana:  Thank you for your kind referral of Tracy Nielsen for consultation of seizures. Although her history is well known to you, please allow me to reiterate it for the purpose of our medical record.   HISTORY OF PRESENT ILLNESS: This is a pleasant 80 year old right-handed woman presenting to establish care for her seizures. She tells me today that she does not have neuropathy, however on review of prior records, he has had an extensive evaluation for this due to lower extremity numbness. Records from her prior physicians were reviewed. She had been seeing neurologist Dr. Morene Antu for more than 30 years after she had her first seizure in 1983 after a skiing trip and a long plane ride. She recalls feeling funny/dizzy while on the phone, then found herself on the floor with no injuries. She was admitted for 5 days at [facility], reporting that the workup was unremarkable. She was started on Dilantin, with a history of toxic levels in the past. She was able to switch to Lamictal in 2010, and has been tolerating this with no side effects. She had one breakthrough seizure on 02/28/2011 when her husband found her seizing with associated tongue bite, per records and she had ran out of lamotrigine at that time.  She denies any focal numbness/tingling/weakness, no olfactory/gustatory hallucinations, rising epigastric sensation, myoclonic jerks. She had problems with her left leg in the past, reporting that if she dorsiflexes her foot, it would feel "almost paralytic." She has stopped the flexing her foot and denies any further similar symptoms. She currently denies any paresthesias or burning pain. She had seen neurologists Dr. Baltazar Najjar and Jannifer Franklin last year, with a  diagnosis of peripheral neuropathy, possibly from MGUS versus Dilantin.  At one point she was on gabapentin but has stopped taking this.  On review of prior records, she had reported worsening lower extremity numbness and has had had an extensive evaluation with an EMG/MCV in 2008 that was normal. Workup showed the IgG monoclonal protein with A light chain specificity, and per records she was evaluated by hematologist Dr. Beryle Beams. It appears she had seen another hematologist recently and was told that everything was normal.  Again, she currently denies any lower extremity numbness or paresthesias at this time.  She denies any headaches, dizziness, neck pain, diplopia, bowel or bladder dysfunction. She has dry eye which occasionally blurs her vision. She has chronic back pain and had back surgery in 2005. She reports that her balance is a Vosler off when walking, she denies any falls. She does not use any assistive devices.  Most recent lamotrigine level from notes reviewed this from December 2013 with a level of 3.6. There are no prior EEGs or MRI brain scans available for review.  Epilepsy Risk Factors: She had a normal birth and early development.  There is no history of febrile convulsions, CNS infections such as meningitis/encephalitis, significant traumatic brain injury, neurosurgical procedures, or family history of seizures.  Prior AEDs: Dilantin  PAST MEDICAL HISTORY: Past Medical History  Diagnosis Date  . Glaucoma   . Seizures   . Low back pain   . Radiculopathy of sacral and sacrococcygeal region     S1  . History of abnormal weight loss   . Hearing deficit  Hearing aids  . MGUS (monoclonal gammopathy of unknown significance)     PAST SURGICAL HISTORY: Past Surgical History  Procedure Laterality Date  . Back surgery    . Ovary surgery    . Appendectomy    . Parathyroidectomy  9/  . Trabeculectomy Left   . Cataract extraction Left     MEDICATIONS: Current  Outpatient Prescriptions on File Prior to Visit  Medication Sig Dispense Refill  . benazepril-hydrochlorthiazide (LOTENSIN HCT) 10-12.5 MG per tablet Take 1 tablet by mouth daily.      . Cholecalciferol (VITAMIN D) 2000 UNITS CAPS Take 2,000 Units by mouth daily.      . fish oil-omega-3 fatty acids 1000 MG capsule Take 2 g by mouth daily.      Marland Kitchen glucosamine-chondroitin 500-400 MG tablet Take 1 tablet by mouth 2 (two) times daily.      Marland Kitchen lamoTRIgine (LAMICTAL) 100 MG tablet TAKE 1 TABLET BY MOUTH IN THE MORNING AND TAKE 1 AND 1/2 TABLETS AT NIGHT  75 tablet  11  . Multiple Vitamin (MULTIVITAMIN) tablet Take 1 tablet by mouth daily.      Vladimir Faster Glycol-Propyl Glycol (SYSTANE) 0.4-0.3 % SOLN Apply 1 drop to eye as needed (One drop left eye as needed).      . timolol (BETIMOL) 0.25 % ophthalmic solution Place 1-2 drops into the right eye 2 (two) times daily.      . Travoprost, BAK Free, (TRAVATAN) 0.004 % SOLN ophthalmic solution Place 1 drop into the right eye at bedtime.       No current facility-administered medications on file prior to visit.    ALLERGIES: No Known Allergies  FAMILY HISTORY: Family History  Problem Relation Age of Onset  . Parkinsonism Father   . Colon cancer Sister   . Multiple sclerosis Daughter   . Muscular dystrophy Grandchild   . Muscular dystrophy Grandchild     SOCIAL HISTORY: History   Social History  . Marital Status: Married    Spouse Name: N/A    Number of Children: 4  . Years of Education: N/A   Occupational History  . Retired    Social History Main Topics  . Smoking status: Former Smoker    Quit date: 12/03/1973  . Smokeless tobacco: Not on file  . Alcohol Use: Yes     Comment: Consumes 1-2 alcohol beverages per day  . Drug Use: No  . Sexual Activity: Not on file   Other Topics Concern  . Not on file   Social History Narrative  . No narrative on file    REVIEW OF SYSTEMS: Constitutional: No fevers, chills, or sweats, no  generalized fatigue, change in appetite Eyes: No visual changes, double vision, eye pain Ear, nose and throat: No hearing loss, ear pain, nasal congestion, sore throat Cardiovascular: No chest pain, palpitations Respiratory:  No shortness of breath at rest or with exertion, wheezes GastrointestinaI: No nausea, vomiting, diarrhea, abdominal pain, fecal incontinence Genitourinary:  No dysuria, urinary retention or frequency Musculoskeletal:  No neck pain, + back pain Integumentary: No rash, pruritus, skin lesions Neurological: as above Psychiatric: No depression, insomnia, anxiety Endocrine: No palpitations, fatigue, diaphoresis, mood swings, change in appetite, change in weight, increased thirst Hematologic/Lymphatic:  No anemia, purpura, petechiae. Allergic/Immunologic: no itchy/runny eyes, nasal congestion, recent allergic reactions, rashes  PHYSICAL EXAM: Filed Vitals:   01/07/14 1025  BP: 106/68  Pulse: 68  Temp: 98 F (36.7 C)  Resp: 18   General: No acute distress Head:  Normocephalic/atraumatic Neck: supple, no paraspinal tenderness, full range of motion Back: No paraspinal tenderness Heart: regular rate and rhythm Lungs: Clear to auscultation bilaterally. Vascular: No carotid bruits. Skin/Extremities: No rash, no edema Neurological Exam: Mental status: alert and oriented to person, place, and time, no dysarthria or dysphagia, intact fluency and comprehension. Cranial nerves: CN I: not tested CN II: pupils equal, round and reactive to light, visual fields intact, fundi unremarkable. CN III, IV, VI:  full range of motion, no nystagmus, mild left ptosis (chronic per patient, not fatigable on exam). CN V: facial sensation intact CN VII: upper and lower face symmetric CN VIII: hearing intact CN IX, X: gag intact, uvula midline CN XI: sternocleidomastoid and trapezius muscles intact CN XII: tongue midline Bulk & Tone: normal, no fasciculations. Motor: 5/5 throughout with  no pronator drift. Sensation: decreased cold and vibration in a stocking distribution up to the ankles bilaterally.  Intact to pin, intact joint position sense.  No extinction to double simultaneous stimulation.  Romberg test:slight sway Deep Tendon Reflexes: +2 throughout except for absent ankle jerks bilaterally, no clonus Plantar responses: downgoing bilaterally Finger to nose testing: no incoordination on finger to nose testing Gait: narrow-based and steady, able to tandem walk adequately.  IMPRESSION: This is a pleasant 80 year old right-handed woman with a history of well-controlled seizures.  Her last seizure was in 2012 after missing medication.  She is currently on Lamotrigine 100mg  1 tab in AM, 1-1/2 tab in PM, with no side effects.  She will continue current dose at this time.  She has a history of peripheral neuropathy, denies any pain at this time. Exam shows evidence of a length-dependent neuropathy to the ankles bilaterally.  Previous extensive evaluation has been unremarkable, she will continue to monitor symptoms and let me know if there is any worsening.  Gait and fall safety precautions were discussed today.  [place] driving laws were discussed with the patient, and she knows to stop driving after a seizure, until 6 months seizure-free.  She will follow-up in 6 months or earlier if needed.  Thank you for allowing me to participate in the care of this patient. Please do not hesitate to call for any questions or concerns.   Tracy Nielsen, M.D.

## 2014-01-08 ENCOUNTER — Encounter: Payer: Self-pay | Admitting: Neurology

## 2014-01-08 ENCOUNTER — Telehealth: Payer: Self-pay | Admitting: Neurology

## 2014-01-11 NOTE — Telephone Encounter (Signed)
Opened in error

## 2014-01-12 ENCOUNTER — Telehealth: Payer: Self-pay | Admitting: *Deleted

## 2014-01-20 ENCOUNTER — Ambulatory Visit (INDEPENDENT_AMBULATORY_CARE_PROVIDER_SITE_OTHER): Payer: Medicare Other | Admitting: Podiatry

## 2014-01-20 ENCOUNTER — Ambulatory Visit (INDEPENDENT_AMBULATORY_CARE_PROVIDER_SITE_OTHER): Payer: Medicare Other

## 2014-01-20 ENCOUNTER — Encounter: Payer: Self-pay | Admitting: Podiatry

## 2014-01-20 VITALS — BP 144/78 | HR 69 | Resp 18

## 2014-01-20 DIAGNOSIS — T847XXA Infection and inflammatory reaction due to other internal orthopedic prosthetic devices, implants and grafts, initial encounter: Secondary | ICD-10-CM

## 2014-01-20 DIAGNOSIS — G589 Mononeuropathy, unspecified: Secondary | ICD-10-CM

## 2014-01-20 DIAGNOSIS — R52 Pain, unspecified: Secondary | ICD-10-CM

## 2014-01-20 DIAGNOSIS — T8460XA Infection and inflammatory reaction due to internal fixation device of unspecified site, initial encounter: Secondary | ICD-10-CM

## 2014-01-20 NOTE — Telephone Encounter (Signed)
Line busy

## 2014-01-20 NOTE — Patient Instructions (Signed)
Contact Dr.Aquino for followup care for neuropathic pain in the left lower leg. I will schedule appointment with Dr..Hyatt to remove the painful internal fixation in the left first metatarsal

## 2014-01-20 NOTE — Telephone Encounter (Signed)
Patient aware of test results.

## 2014-01-20 NOTE — Progress Notes (Signed)
Subjective:    Patient ID: Tracy Nielsen, female    DOB: 1930-10-07, 80 y.o.   MRN: [id number]  HPI My left foot starting Friday in the night and sharp pain and sore and tender and there is a lump there and fine in the day time and moving around and it goes every 2 to 3 minutes and I get up to walk around and it does ease up and hurts on top of my right foot.  This patient presents complaining of approximately 5 day history of sharp stabbing pain  primarily at night with each episode lasting seconds up to about 10-15 episodes. The symptoms seem to be self-limiting at this time. Patient describes visiting multiple neurologist but does seem somewhat confused as to the findings of these neurologist.  In addition she is complaining of a painful area in the surgical site on the left first metatarsal when wearing shoes. It is difficult to elicit the duration of this complaint. She does not recall having  foot surgery.    Review of Systems  All other systems reviewed and are negative.       Objective:   Physical Exam A pleasant 80 year old white female appears orientated x3.  Vascular DP and PT pulses are 2/4 bilaterally. Feet are warm to cool to touch bilaterally.  Neurological: Sensation to 10 g monofilament wire intact 8/10 bilaterally. Vibratory sensation diminished bilaterally. Knee and ankle reflexes are reactive bilaterally.  Dermatological: Well-healed surgical incisions localized over the dorsal first metatarsophalangeal joints bilaterally. Texture and turgor within normal limits. There is a palpable metallic object in the left incisional area over the first metatarsal.  Musculoskeletal: Patient has somewhat of an unsteady gait pattern. She is able to heel off bilaterally. Palpation in her left lower leg foot where she is complaining of a stinging pain elicits no palpable lesions or ability to duplicate her complaints of stinging in her left lower leg or foot.  Manual motor  testing: Hip flexion and extension 5/5 bilaterally Hip abduction an adduction 5/5 bilaterally Knee flexion extension 5/5 bilaterally Ankle dorsi and plantar and plantar flexion 5/5 bilaterally Foot inversion and eversion 5/5 bilaterally  X-ray report weightbearing left foot  Intact bony structure without a fracture and/or dislocations noted. Retained internal K wire fixation x2 in the first metatarsal.and are in satisfactory position (please note clinically patient was complaining of discomfort upon palpation over the dorsal bend  of these 2 K wires). A well-healed osteotomy of the left first metatarsal and good alignment is noted. There is a mild decrease of bone density noted in all views.  Radiographic impression: Well-healed first left metatarsal with retained internal K wire fixation No acute bony abnormality noted.  The chart notes from the office visit of Dr. Ellouise Newer dated 01/07/2014 were noted. There was some mention of neuropathy, seizures and gait disturbance.     Assessment & Plan:  Assessment: Neuropathy undetermined origin left lower leg Gait disturbance Painful internal K wire fixation left first metatarsal Some suggestion of possible memory disorder because patient does not even remember having foot surgery in her general confusion about the results of previous medical evaluations.  Plan: I did give patient a copy of her chart notes from the neurologist previsit 01/07/2014 and explained to her that she was going to have a followup in 6 weeks by Dr. Laurence Slate from the visit of 01/07/2014. We also had a discussion about her gait disturbance occurs to be vigilant when walking.  In regards to the  painful internal K wire fixation I referred this patient to Dr. Milinda Pointer for further evaluation and possible removal of the internal K wire fixation in the left first metatarsal.

## 2014-01-22 ENCOUNTER — Ambulatory Visit (INDEPENDENT_AMBULATORY_CARE_PROVIDER_SITE_OTHER): Payer: Medicare Other | Admitting: Neurology

## 2014-01-22 ENCOUNTER — Encounter: Payer: Self-pay | Admitting: Neurology

## 2014-01-22 VITALS — BP 128/60 | HR 76 | Temp 97.6°F | Ht 63.39 in | Wt 130.1 lb

## 2014-01-22 DIAGNOSIS — G609 Hereditary and idiopathic neuropathy, unspecified: Secondary | ICD-10-CM

## 2014-01-22 MED ORDER — GABAPENTIN 300 MG PO CAPS: ORAL_CAPSULE | ORAL | Status: DC

## 2014-01-22 NOTE — Progress Notes (Signed)
NEUROLOGY FOLLOW UP OFFICE NOTE  Tracy Nielsen [id number]  HISTORY OF PRESENT ILLNESS: I had the pleasure of seeing Tracy Nielsen in follow-up in the neurology clinic on 01/22/2014.  The patient was last seen 2 weeks ago to establish care for her seizures which are well-controlled on Lamictal.  She presents today with a new problems of excruciating left leg pain occurring only at night.  On her initial visit 2 weeks ago, she denied any lower extremity numbness, paresthesias, or pain.  Records had been reviewed from her prior neurologists, she had problems with her left leg in the past, reporting that if she dorsiflexes her foot, it would feel "almost paralytic." She has stopped the flexing her foot and denies any further similar symptoms. She had seen neurologists Dr. Baltazar Najjar and Jannifer Franklin last year, with a diagnosis of peripheral neuropathy, possibly from MGUS versus Dilantin. At one point she was on gabapentin but has stopped taking this for unrecalled reasons. On review of prior records, she had reported worsening lower extremity numbness and has had had an extensive evaluation with an EMG/MCV in 2008 that was normal. Workup showed the IgG monoclonal protein with A light chain specificity, and per records she was evaluated by hematologist Dr. Beryle Beams. It appears she had seen another hematologist recently and was told that everything was normal.   She tells me that Friday last week, she was woken up by excruciating shooting pain going down her left leg from the knee to the foot, occurring 3 minutes apart.  She woke up the next day with no symptoms and went through her day without difficulties, then again that evening woke up at 3am with the same excruciating pain.  This has been ongoing every night for the past week.  No symptoms during the daytime.  She notes that walking around definitely helps and makes a big difference.  She tried a heating pad last night and was able to sleep.  She had seen her  podiatrist who has referred her for possible removal of internal fixation wire in the first metatarsal.  She reports that the pain this week is "entirely different" from the pain she had when evaluated by prior neurologists.  She reports numbness over the dorsum and sole of the left foot.  She denies any weakness.  No recent falls or injuries.  PAST MEDICAL HISTORY: Past Medical History  Diagnosis Date  . Glaucoma   . Seizures   . Low back pain   . Radiculopathy of sacral and sacrococcygeal region     S1  . History of abnormal weight loss   . Hearing deficit     Hearing aids  . MGUS (monoclonal gammopathy of unknown significance)     MEDICATIONS: Current Outpatient Prescriptions on File Prior to Visit  Medication Sig Dispense Refill  . benazepril-hydrochlorthiazide (LOTENSIN HCT) 10-12.5 MG per tablet Take 1 tablet by mouth daily.      . Cholecalciferol (VITAMIN D) 2000 UNITS CAPS Take 2,000 Units by mouth daily.      . dorzolamide-timolol (COSOPT) 22.3-6.8 MG/ML ophthalmic solution       . doxycycline (VIBRAMYCIN) 100 MG capsule       . fish oil-omega-3 fatty acids 1000 MG capsule Take 2 g by mouth daily.      Marland Kitchen glucosamine-chondroitin 500-400 MG tablet Take 1 tablet by mouth 2 (two) times daily.      Marland Kitchen lamoTRIgine (LAMICTAL) 100 MG tablet TAKE 1 TABLET BY MOUTH IN THE MORNING AND TAKE 1  AND 1/2 TABLETS AT NIGHT  75 tablet  11  . Multiple Vitamin (MULTIVITAMIN) tablet Take 1 tablet by mouth daily.      Vladimir Faster Glycol-Propyl Glycol (SYSTANE) 0.4-0.3 % SOLN Apply 1 drop to eye as needed (One drop left eye as needed).      . timolol (BETIMOL) 0.25 % ophthalmic solution Place 1-2 drops into the right eye 2 (two) times daily.      . Travoprost, BAK Free, (TRAVATAN) 0.004 % SOLN ophthalmic solution Place 1 drop into the right eye at bedtime.       No current facility-administered medications on file prior to visit.    ALLERGIES: No Known Allergies  FAMILY HISTORY: Family History    Problem Relation Age of Onset  . Parkinsonism Father   . Colon cancer Sister   . Multiple sclerosis Daughter   . Muscular dystrophy Grandchild   . Muscular dystrophy Grandchild     SOCIAL HISTORY: History   Social History  . Marital Status: Married    Spouse Name: N/A    Number of Children: 4  . Years of Education: N/A   Occupational History  . Retired    Social History Main Topics  . Smoking status: Former Smoker    Quit date: 12/03/1973  . Smokeless tobacco: Not on file  . Alcohol Use: Yes     Comment: Consumes 1-2 alcohol beverages per day  . Drug Use: No  . Sexual Activity: Not on file   Other Topics Concern  . Not on file   Social History Narrative  . No narrative on file    REVIEW OF SYSTEMS: Constitutional: No fevers, chills, or sweats, no generalized fatigue, change in appetite Eyes: No visual changes, double vision, eye pain Ear, nose and throat: No hearing loss, ear pain, nasal congestion, sore throat Cardiovascular: No chest pain, palpitations Respiratory:  No shortness of breath at rest or with exertion, wheezes GastrointestinaI: No nausea, vomiting, diarrhea, abdominal pain, fecal incontinence Genitourinary:  No dysuria, urinary retention or frequency Musculoskeletal:  No neck pain, + back pain Integumentary: No rash, pruritus, skin lesions Neurological: as above Psychiatric: No depression, insomnia, anxiety Endocrine: No palpitations, fatigue, diaphoresis, mood swings, change in appetite, change in weight, increased thirst Hematologic/Lymphatic:  No anemia, purpura, petechiae. Allergic/Immunologic: no itchy/runny eyes, nasal congestion, recent allergic reactions, rashes  PHYSICAL EXAM: Filed Vitals:   01/22/14 1431  BP: 128/60  Pulse: 76  Temp: 97.6 F (36.4 C)   General: No acute distress Head:  Normocephalic/atraumatic Neck: supple, no paraspinal tenderness, full range of motion Heart:  Regular rate and rhythm Lungs:  Clear to  auscultation bilaterally Back: No paraspinal tenderness Skin/Extremities: No rash, no edema Neurological Exam: alert and oriented to person, place, and time. No aphasia or dysarthria. Fund of knowledge is appropriate.  Recent and remote memory are intact.  Attention and concentration are normal.    Able to name objects and repeat phrases. Cranial nerves: Pupils equal, round, reactive to light.  Extraocular movements intact with no nystagmus. Visual fields full. Facial sensation intact. No facial asymmetry. Tongue, uvula, palate midline.  Motor: Bulk and tone normal, muscle strength 5/5 throughout with no pronator drift.  Sensory exam similar to visit 2 weeks ago with decreased cold and vibration in a stocking distribution up to the ankles bilaterally. Intact to pin, intact joint position sense. No extinction to double simultaneous stimulation. Romberg test:slight sway Deep Tendon Reflexes: +2 throughout except for absent ankle jerks bilaterally, no clonus. Plantar responses:  downgoing bilaterally Finger to nose testing: no incoordination on finger to nose testing Gait: narrow-based and steady.  IMPRESSION: This is a pleasant 80 year old right-handed woman seen 2 weeks ago to establish care for well-controlled seizures.  She presents with a new problem today of sharp pain going down the left leg for the past week.  She has a history of peripheral neuropathy and has had an extensive evaluation that was unremarkable. Exam again shows evidence of a length-dependent neuropathy.  On review of her prior records, the leg pain had been a significant complaint on visits with her prior neurologists.  She does not recall why she stopped gabapentin, and will restart the medication for neuropathic pain. Side effects were discussed, she will start 300mg  qhs, with plans for uptitration if tolerated.  She reports this as a different symptom from before, and will be referred for EMG/NCV of the left LE.  We again discussed gait  and fall safety precautions.  She will follow-up in 2 months or earlier if needed  The duration of this appointment visit was 21 minutes of face-to-face time with the patient.  Greater than 50% of this time was spent in counseling, explanation of diagnosis, planning of further management, and coordination of care.   Ellouise Newer, M.D.   CC: Dr. Kendell Bane

## 2014-01-22 NOTE — Patient Instructions (Signed)
1. Start gabapentin 300mg  capsules, take 1 cap at bedtime 2. Schedule EMG/NCV of the left LE 3. Follow-up in 2 months 4. Call for any problems

## 2014-02-04 ENCOUNTER — Encounter: Payer: Self-pay | Admitting: Podiatry

## 2014-02-04 ENCOUNTER — Ambulatory Visit (INDEPENDENT_AMBULATORY_CARE_PROVIDER_SITE_OTHER): Payer: Medicare Other | Admitting: Podiatry

## 2014-02-04 VITALS — BP 125/67 | HR 73 | Resp 16

## 2014-02-04 DIAGNOSIS — G5790 Unspecified mononeuropathy of unspecified lower limb: Secondary | ICD-10-CM

## 2014-02-04 DIAGNOSIS — M7750 Other enthesopathy of unspecified foot: Secondary | ICD-10-CM

## 2014-02-04 DIAGNOSIS — G5792 Unspecified mononeuropathy of left lower limb: Secondary | ICD-10-CM

## 2014-02-04 NOTE — Progress Notes (Signed)
Having pain in this left foot, saw dr Amalia Hailey about a month ago, but the pain is getting unbearable and can not go another night like this. She states that the pain in her left leg is been controlled by gabapentin. However she still having severe pain to the medial aspect of the head of the first metatarsal and medial aspect of the first metatarsophalangeal joint of the left foot.  Objective: Vital signs are stable she is alert and oriented x3. Pulses are intact left foot. She has mild tenderness on palpation of the first metatarsophalangeal joint medially there is mild erythema and there does appear to be some bogginess in the area very much like a bursitis. She does have palpable K wires to the dorsal medial aspect of the first metatarsal of the left foot with associated old bunion repair. Radiographic evaluation demonstrates K wire medial aspect of the bone.   Assessment: Neuritis bursitis first metatarsophalangeal joint of the left foot.  Plan: Discussed etiology pathology conservative versus surgical therapies. We injected dexamethasone and local anesthetic to the medial aspect of the first metatarsophalangeal joint.  We will reevaluate her next time she comes in for surgical removal of the K wires.

## 2014-02-09 ENCOUNTER — Encounter: Payer: Self-pay | Admitting: Podiatry

## 2014-02-09 ENCOUNTER — Ambulatory Visit (INDEPENDENT_AMBULATORY_CARE_PROVIDER_SITE_OTHER): Payer: Medicare Other | Admitting: Podiatry

## 2014-02-09 VITALS — BP 118/75 | HR 78 | Resp 16

## 2014-02-09 DIAGNOSIS — G5790 Unspecified mononeuropathy of unspecified lower limb: Secondary | ICD-10-CM

## 2014-02-09 DIAGNOSIS — G5792 Unspecified mononeuropathy of left lower limb: Secondary | ICD-10-CM

## 2014-02-09 DIAGNOSIS — T8460XA Infection and inflammatory reaction due to internal fixation device of unspecified site, initial encounter: Secondary | ICD-10-CM

## 2014-02-09 DIAGNOSIS — T847XXA Infection and inflammatory reaction due to other internal orthopedic prosthetic devices, implants and grafts, initial encounter: Secondary | ICD-10-CM

## 2014-02-09 NOTE — Progress Notes (Signed)
Foot is doing real bad !  Objective: Vital signs are stable she is alert and oriented x3. I saw her last week or the week before and her pulses are palpable and he still are. She still complaining of pain to the medial aspect of the first metatarsophalangeal joint of the right foot. Retention of the K wire status post bunion repair several years ago is possibly the culprit here. I do think removing that would be a good for start to reading her of her pain.  Assessment: Pain in limb secondary to neuralgia associated with painful internal fixation.  Plan: We discussed the etiology pathology conservative versus surgical therapies at this point we are going to consent her for surgical removal of internal fixation x2 K wires first metatarsal of the left foot. We went over consent today line bylined number by number giving her ample time to ask questions she saw fit regarding these procedures answer them to the best of my ability in layman's terms. I will followup with her in the near future for surgical intervention.

## 2014-02-11 ENCOUNTER — Other Ambulatory Visit: Payer: Self-pay | Admitting: Podiatry

## 2014-02-11 MED ORDER — CEPHALEXIN 500 MG PO CAPS: 500.0000 mg | ORAL_CAPSULE | Freq: Three times a day (TID) | ORAL | Status: DC

## 2014-02-11 MED ORDER — HYDROCODONE-ACETAMINOPHEN 5-325 MG PO TABS: ORAL_TABLET | ORAL | Status: DC

## 2014-02-12 ENCOUNTER — Encounter: Payer: Self-pay | Admitting: Podiatry

## 2014-02-12 DIAGNOSIS — Z472 Encounter for removal of internal fixation device: Secondary | ICD-10-CM

## 2014-02-16 ENCOUNTER — Ambulatory Visit (INDEPENDENT_AMBULATORY_CARE_PROVIDER_SITE_OTHER): Payer: Medicare Other | Admitting: Podiatry

## 2014-02-16 ENCOUNTER — Ambulatory Visit: Payer: Medicare Other | Admitting: Podiatry

## 2014-02-16 ENCOUNTER — Ambulatory Visit: Payer: Self-pay

## 2014-02-16 ENCOUNTER — Ambulatory Visit (INDEPENDENT_AMBULATORY_CARE_PROVIDER_SITE_OTHER): Payer: Medicare Other

## 2014-02-16 ENCOUNTER — Encounter: Payer: Self-pay | Admitting: Podiatry

## 2014-02-16 VITALS — BP 108/65 | HR 77 | Temp 98.6°F | Resp 16

## 2014-02-16 DIAGNOSIS — Z9889 Other specified postprocedural states: Secondary | ICD-10-CM

## 2014-02-16 NOTE — Progress Notes (Signed)
Left foot pin removal dos 3.13.15 , had pain two nights ago and no pain last night . She denies fever chills nausea vomiting muscle aches and pains.  Objective: Vital signs are stable she is alert and oriented x3. She states that the dressing had fallen off of her left foot. At this point there is no erythema does mild edema no saline is drainage or odor sutures are intact and margins appear to be well coapted.  Assessment: Well-healing surgical foot left. Redressed the foot today with a dressed a compressive dressing will followup with her in one week for suture removal.

## 2014-02-17 ENCOUNTER — Telehealth: Payer: Self-pay | Admitting: *Deleted

## 2014-02-17 NOTE — Telephone Encounter (Signed)
Per Dr. Milinda Pointer, I called to see how the patient was doing.  She stated I'm still having sharp pains in my foot off and on.  I'll guess it's part of the healing process.  I told her it was.  I advised her to elevate as much as possible.  She stated she would do her best, she's had to go to the grocery store today and all that good stuff.  I asked her to give [place] a call with any questions or concerns.

## 2014-02-23 ENCOUNTER — Encounter: Payer: Self-pay | Admitting: Podiatry

## 2014-02-23 ENCOUNTER — Ambulatory Visit (INDEPENDENT_AMBULATORY_CARE_PROVIDER_SITE_OTHER): Payer: Medicare Other | Admitting: Podiatry

## 2014-02-23 VITALS — BP 123/63 | HR 105 | Resp 15 | Ht 64.0 in | Wt 125.0 lb

## 2014-02-23 DIAGNOSIS — G5792 Unspecified mononeuropathy of left lower limb: Secondary | ICD-10-CM

## 2014-02-23 DIAGNOSIS — G5790 Unspecified mononeuropathy of unspecified lower limb: Secondary | ICD-10-CM

## 2014-02-23 NOTE — Progress Notes (Signed)
Tracy Nielsen presents today for followup of her removal of internal fixation first metatarsal left foot. She states that the neuritis and paresthesias that she has felt are starting to subside and become less frequent. However she states that her primary Dr. have scheduled her for entire EMG and nerve conduction velocity exam of her left extremity. She denies fever chills nausea vomiting muscle aches and pains and states the incision site is doing well and not painful.  Objective: Vital signs are stable she is alert and oriented x3. Dry sterile dressing is intact once removed demonstrates well coapted margins with sutures. Sutures were removed today margins remain well coapted there is no erythema edema cellulitis drainage or odor.  Assessment: Well-healing surgical site left foot that is post removal internal fixation first metatarsal left foot x2 weeks.  Plan: Discussed etiology pathology conservative versus surgical therapies allow her to get back to her regular routine I will followup with her on an as-needed basis.

## 2014-02-24 ENCOUNTER — Encounter: Payer: Self-pay | Admitting: Neurology

## 2014-02-24 ENCOUNTER — Ambulatory Visit (INDEPENDENT_AMBULATORY_CARE_PROVIDER_SITE_OTHER): Payer: Medicare Other | Admitting: Neurology

## 2014-02-24 DIAGNOSIS — G609 Hereditary and idiopathic neuropathy, unspecified: Secondary | ICD-10-CM

## 2014-02-24 NOTE — Progress Notes (Signed)
See procedure note for EMG results.  Donika K. Patel, DO  

## 2014-02-24 NOTE — Procedures (Signed)
[facility]  [place], [address]  [place], [place] [address] Tel: [phone] Fax:  [phone] Test Date:  02/24/2014  Patient: Tracy Nielsen DOB: 01-Mar-1930 Physician: Narda Amber, DO  Sex: Female Height: 5\' 3"  Ref Phys: Ellouise Newer  ID#: [id number] Temp: 33.4C Technician:    Patient Complaints: This is an 80 year-old female presenting for evaluation left leg and foot pain.  NCV & EMG Findings: Extensive electrodiagnostic evaluation of the left lower extremity reveals: 1. Absent sural and superficial peroneal responses. 2. The tibial and peroneal motor responses are normal. 3. Chronic motor axonal loss changes are seen affecting the muscles below the knee and include the flexor digitorum longus, tibialis anterior, and medial gastrocnemius muscles. There is no evidence of active denervation.  Impression: There is electrophysiological evidence of chronic generalized sensorimotor polyneuropathy affecting the left lower extremity conforming to a gradient pattern.  Overall, these findings are mild in degree electrically.  There is no definite evidence of a superimposed lumbosacral radiculopathy.   ___________________________ Narda Amber, DO    Nerve Conduction Studies Anti Sensory Summary Table   Site NR Peak (ms) Norm Peak (ms) P-T Amp (V) Norm P-T Amp  Left Sup Peroneal Anti Sensory (Ant Lat Mall)  12 cm NR  <4.6  >3  Left Sural Anti Sensory (Lat Mall)  Calf NR  <4.6  >3   Motor Summary Table   Site NR Onset (ms) Norm Onset (ms) O-P Amp (mV) Norm O-P Amp Site1 Site2 Delta-0 (ms) Dist (cm) Vel (m/s) Norm Vel (m/s)  Left Peroneal Motor (Ext Dig Brev)  Ankle    3.3 <6.0 3.6 >2.5 B Fib Ankle 6.2 32.0 52 >40  B Fib    9.5  2.9  Poplt B Fib 2.1 10.0 48 >40  Poplt    11.6  2.8         Left Peroneal TA Motor (Tib Ant)  Fib Head    2.7 <4.5 4.7 >3 Poplit Fib Head 1.9 10.0 53 >40  Poplit    4.6  4.5         Left Tibial Motor (Abd Hall Brev)  Ankle     3.8 <6.0 11.3 >4 Knee Ankle 8.0 35.0 44 >40  Knee    11.8  8.3          EMG   Side Muscle Ins Act Fibs Psw Fasc Number Recrt Dur Dur. Amp Amp. Poly Poly. Comment  Left AntTibialis Nml Nml Nml Nml 1- Mod Few 1+ Nml Nml Nml Nml N/A  Left Gastroc Nml Nml Nml Nml 1- Mod-R Some 1+ Some 1+ Few 1+ N/A  Left Flex Dig Long Nml Nml Nml Nml 2- Mod-R Few 1+ Some 1+ Some 1+ N/A  Left RectFemoris Nml Nml Nml Nml Nml Nml Nml Nml Nml Nml Nml Nml N/A  Left GluteusMed Nml Nml Nml Nml Nml Nml Nml Nml Nml Nml Nml Nml N/A  Left BicepsFemS Nml Nml Nml Nml Nml Nml Nml Nml Nml Nml Nml Nml N/A      Waveforms:

## 2014-02-25 ENCOUNTER — Encounter: Payer: Medicare Other | Admitting: Podiatry

## 2014-03-10 NOTE — Progress Notes (Signed)
Dr Milinda Pointer performed - Removal wires left foot 1st metatarsal.  Dr Milinda Pointer ordered Vicodin 5/325mg  #30 1 or 2 tablet every 6 to 8 hours prn pain, and Keflex 500mg  #30 1 capsule tid until gone.

## 2014-03-12 ENCOUNTER — Ambulatory Visit (INDEPENDENT_AMBULATORY_CARE_PROVIDER_SITE_OTHER): Payer: Medicare Other | Admitting: Neurology

## 2014-03-12 ENCOUNTER — Encounter: Payer: Self-pay | Admitting: Neurology

## 2014-03-12 VITALS — BP 88/44 | HR 64 | Resp 16 | Ht 64.0 in | Wt 130.0 lb

## 2014-03-12 DIAGNOSIS — G609 Hereditary and idiopathic neuropathy, unspecified: Secondary | ICD-10-CM

## 2014-03-12 DIAGNOSIS — M25579 Pain in unspecified ankle and joints of unspecified foot: Secondary | ICD-10-CM

## 2014-03-12 MED ORDER — GABAPENTIN 300 MG PO CAPS: ORAL_CAPSULE | ORAL | Status: DC

## 2014-03-12 NOTE — Progress Notes (Signed)
NEUROLOGY FOLLOW UP OFFICE NOTE  JASALYN FRYSINGER [id number]  HISTORY OF PRESENT ILLNESS: I had the pleasure of seeing Ms. Bauch in follow-up in the neurology clinic on 03/12/2014. She was last seen 2 months ago for left leg pain.  An EMG/NCV done showed mild peripheral neuropathy in the left leg.  She has had an extensive evaluation with an EMG/NCV in 2008 that was normal. Workup showed the IgG monoclonal protein with A light chain specificity, and per records she was evaluated by hematologist Dr. Beryle Beams. It appears she had seen another hematologist recently and was told that everything was normal.   She had been complaining of excruciating pain going down her left leg from the knee to the foot.  Gabapentin was started, which appears to have helped with this pain, however she returns today to report localized pain over the medial side of her left foot, near an incision where she had removal of internal fixation first metatarsal left foot on 02/12/14.  She reports the pain is significantly worse at night, localized to the base of the big toe, worse when she rubs her foot with shooting pains.  No drainage or swelling noted.  She had a foot xray done 02/16/14 reported as showing postsurgical foot bunion repair removal of K wires.  She denies any seizures.  PAST MEDICAL HISTORY: Past Medical History  Diagnosis Date  . Glaucoma   . Seizures   . Low back pain   . Radiculopathy of sacral and sacrococcygeal region     S1  . History of abnormal weight loss   . Hearing deficit     Hearing aids  . MGUS (monoclonal gammopathy of unknown significance)     MEDICATIONS: Current Outpatient Prescriptions on File Prior to Visit  Medication Sig Dispense Refill  . benazepril-hydrochlorthiazide (LOTENSIN HCT) 10-12.5 MG per tablet Take 1 tablet by mouth daily.      . Cholecalciferol (VITAMIN D) 2000 UNITS CAPS Take 2,000 Units by mouth daily.      . dorzolamide-timolol (COSOPT) 22.3-6.8 MG/ML  ophthalmic solution       . fish oil-omega-3 fatty acids 1000 MG capsule Take 2 g by mouth daily.      Marland Kitchen glucosamine-chondroitin 500-400 MG tablet Take 1 tablet by mouth 2 (two) times daily.      Marland Kitchen lamoTRIgine (LAMICTAL) 100 MG tablet TAKE 1 TABLET BY MOUTH IN THE MORNING AND TAKE 1 AND 1/2 TABLETS AT NIGHT  75 tablet  11  . Multiple Vitamin (MULTIVITAMIN) tablet Take 1 tablet by mouth daily.      Vladimir Faster Glycol-Propyl Glycol (SYSTANE) 0.4-0.3 % SOLN Apply 1 drop to eye as needed (One drop left eye as needed).      . timolol (BETIMOL) 0.25 % ophthalmic solution Place 1-2 drops into the right eye 2 (two) times daily.      . Travoprost, BAK Free, (TRAVATAN) 0.004 % SOLN ophthalmic solution Place 1 drop into the right eye at bedtime.       No current facility-administered medications on file prior to visit.    ALLERGIES: No Known Allergies  FAMILY HISTORY: Family History  Problem Relation Age of Onset  . Parkinsonism Father   . Colon cancer Sister   . Multiple sclerosis Daughter   . Muscular dystrophy Grandchild   . Muscular dystrophy Grandchild     SOCIAL HISTORY: History   Social History  . Marital Status: Married    Spouse Name: N/A    Number of Children: 4  .  Years of Education: N/A   Occupational History  . Retired    Social History Main Topics  . Smoking status: Former Smoker    Quit date: 12/03/1973  . Smokeless tobacco: Not on file  . Alcohol Use: Yes     Comment: Consumes 1-2 alcohol beverages per day  . Drug Use: No  . Sexual Activity: Not on file   Other Topics Concern  . Not on file   Social History Narrative  . No narrative on file    REVIEW OF SYSTEMS: Constitutional: No fevers, chills, or sweats, no generalized fatigue, change in appetite Eyes: No visual changes, double vision, eye pain Ear, nose and throat: No hearing loss, ear pain, nasal congestion, sore throat Cardiovascular: No chest pain, palpitations Respiratory:  No shortness of breath  at rest or with exertion, wheezes GastrointestinaI: No nausea, vomiting, diarrhea, abdominal pain, fecal incontinence Genitourinary:  No dysuria, urinary retention or frequency Musculoskeletal:  No neck pain, back pain Integumentary: No rash, pruritus, skin lesions Neurological: as above Psychiatric: No depression, insomnia, anxiety Endocrine: No palpitations, fatigue, diaphoresis, mood swings, change in appetite, change in weight, increased thirst Hematologic/Lymphatic:  No anemia, purpura, petechiae. Allergic/Immunologic: no itchy/runny eyes, nasal congestion, recent allergic reactions, rashes  PHYSICAL EXAM: Filed Vitals:   03/12/14 1120  BP: 88/44  Pulse: 64  Resp: 16   General: No acute distress  Head: Normocephalic/atraumatic  Neck: supple, no paraspinal tenderness, full range of motion  Heart: Regular rate and rhythm  Lungs: Clear to auscultation bilaterally  Back: No paraspinal tenderness  Skin/Extremities: No rash, no edema. Well-healed incision on the medial side of left foot, patient reports tenderness to palpation. Neurological Exam: alert and oriented to person, place, and time. No aphasia or dysarthria. Fund of knowledge is appropriate. Recent and remote memory are intact. Attention and concentration are normal. Able to name objects and repeat phrases. Cranial nerves: Pupils equal, round, reactive to light. Extraocular movements intact with no nystagmus. Visual fields full. Facial sensation intact. No facial asymmetry. Tongue, uvula, palate midline. Motor: Bulk and tone normal, muscle strength 5/5 throughout with no pronator drift. Sensory exam similar to visit 2 weeks ago with decreased cold and vibration in a stocking distribution up to the ankles bilaterally. Intact to pin, intact joint position sense. No extinction to double simultaneous stimulation. Romberg test:slight sway Deep Tendon Reflexes: +2 throughout except for absent ankle jerks bilaterally, no clonus. Plantar  responses: downgoing bilaterally Finger to nose testing: no incoordination on finger to nose testing Gait: narrow-based and steady.   IMPRESSION:  This is a pleasant 80 yo RH woman with a history of well-controlled seizures and peripheral neuropathy.  She has had an extensive evaluation that was unremarkable. Her EMG/NCV confirmed mild peripheral neuropathy in the left LE.  She presents today with significant pain localized over the medial side of the left big toe near the incision where she had removal of internal fixation of the first metatarsal.  She does peripheral neuropathy, however the localized pain is concerning for structural cause rather than from primary neuropathy.  She will increase gabapentin for symptomatic treatment of pain. The patient is requesting for a second opinion regarding localized pain and will be referred to ortho.  She will follow-up in 3-4 months or earlier if needed   The duration of this appointment visit was 15 minutes of face-to-face time with the patient. Greater than 50% of this time was spent in counseling, explanation of diagnosis, planning of further management, and coordination of  care.  Ellouise Newer, M.D.

## 2014-03-12 NOTE — Patient Instructions (Addendum)
1. Increase gabapentin 300mg : Take 2 caps at night for 1 week, then increase to 1 cap in AM, 2 caps in PM 2. Refer to orthopedic foot specialist for second opinion (Dr. Latanya Maudlin) 3. Followup  In 3 months

## 2014-03-14 ENCOUNTER — Encounter: Payer: Self-pay | Admitting: Neurology

## 2014-03-15 NOTE — Addendum Note (Signed)
Addended by: Ella Jubilee on: 03/15/2014 03:06 PM   Modules accepted: Orders

## 2014-03-26 ENCOUNTER — Ambulatory Visit: Payer: Medicare Other | Admitting: Neurology

## 2014-04-08 ENCOUNTER — Ambulatory Visit: Payer: Medicare Other | Admitting: Neurology

## 2014-06-10 ENCOUNTER — Other Ambulatory Visit: Payer: Self-pay | Admitting: *Deleted

## 2014-06-10 ENCOUNTER — Telehealth: Payer: Self-pay | Admitting: Neurology

## 2014-06-10 NOTE — Telephone Encounter (Signed)
Pt needs to talk to someone about medication please call [phone]

## 2014-06-10 NOTE — Telephone Encounter (Signed)
Patient scheduled for follow up appointment.

## 2014-06-11 ENCOUNTER — Ambulatory Visit: Payer: Medicare Other | Admitting: Neurology

## 2014-06-14 ENCOUNTER — Encounter: Payer: Self-pay | Admitting: Neurology

## 2014-06-14 ENCOUNTER — Ambulatory Visit (INDEPENDENT_AMBULATORY_CARE_PROVIDER_SITE_OTHER): Payer: Medicare Other | Admitting: Neurology

## 2014-06-14 VITALS — BP 124/60 | HR 77 | Ht 64.0 in | Wt 121.3 lb

## 2014-06-14 DIAGNOSIS — G609 Hereditary and idiopathic neuropathy, unspecified: Secondary | ICD-10-CM

## 2014-06-14 DIAGNOSIS — G40309 Generalized idiopathic epilepsy and epileptic syndromes, not intractable, without status epilepticus: Secondary | ICD-10-CM

## 2014-06-14 DIAGNOSIS — R569 Unspecified convulsions: Secondary | ICD-10-CM | POA: Insufficient documentation

## 2014-06-14 MED ORDER — LAMOTRIGINE 100 MG PO TABS: ORAL_TABLET | ORAL | Status: DC

## 2014-06-14 NOTE — Progress Notes (Signed)
NEUROLOGY FOLLOW UP OFFICE NOTE  DELANDA BULLUCK [id number]  HISTORY OF PRESENT ILLNESS: I had the pleasure of seeing Hawley Pavia in follow-up in the neurology clinic on 06/14/2014.  The patient was last seen 3 months ago for left foot pain and history of seizures.  On her last visit, dose of gabapentin was increased for symptomatic relief of left foot pain, however pain was very localized to the base of the toe and she was referred to Ortho for a second opinion.  She tells me that the stitched were taken out of her foot, and since then the localized pain has resolved.  She has occasional cramps and pain in both feet at night when she plantarflexes them.  She has avoided flexing her foot at night, and feels fine.  She denies any paresthesias in her feet today. She tells me today that she is not taking the gabapentin anymore.  She fell backwards while gardening 3 weeks ago and hit the back of her head.  She did not lose consciousness.  The next day, she started having pain in the back of her head and whole body pain.  This is improving, no nausea, vomiting, focal numbness/tingling/weakness.  She denies any seizures since 2012, no side effects on Lamotrigine 100mg  in AM, 150mg  in PM.   HPI:  This is a very pleasant 80yo RH woman with a history of seizures and neuropathy.  She had been seeing neurologist Dr. Morene Antu for more than 30 years after she had her first seizure in 1983 after a skiing trip and a long plane ride. She recalls feeling funny/dizzy while on the phone, then found herself on the floor with no injuries. She was admitted for 5 days at [facility], reporting that the workup was unremarkable. She was started on Dilantin, with a history of toxic levels in the past. She was able to switch to Lamictal in 2010, and has been tolerating this with no side effects. She had one breakthrough seizure on 02/28/2011 when her husband found her seizing with associated tongue bite, per  records and she had ran out of lamotrigine at that time.   She has had problems with pain in her legs in the past.  She has had an extensive evaluation with an EMG/NCV in 2008 that was normal. Workup showed the IgG monoclonal protein with A light chain specificity, and per records she was evaluated by hematologist Dr. Beryle Beams. It appears she had seen another hematologist recently and was told that everything was normal. She had seen neurologists Dr. Baltazar Najjar and Jannifer Franklin in 2014, with a diagnosis of peripheral neuropathy, possibly from MGUS versus Dilantin. At one point she was on gabapentin but has stopped taking this. On review of prior records, she had reported worsening lower extremity numbness and has had had an extensive evaluation with an EMG/MCV in 2008 that was normal. Workup showed the IgG monoclonal protein with A light chain specificity, and per records she was evaluated by hematologist Dr. Beryle Beams. It appears she had seen another hematologist recently and was told that everything was normal. Most recent EMG/NCV 01/2014 done showed mild peripheral neuropathy in the left leg when she was complaining of excruciating pain going down her left leg from the knee to the foot.   Most recent lamotrigine level from notes reviewed this from December 2013 with a level of 3.6. There are no prior EEGs or MRI brain scans available for review.   Epilepsy Risk Factors: She had a normal  birth and early development. There is no history of febrile convulsions, CNS infections such as meningitis/encephalitis, significant traumatic brain injury, neurosurgical procedures, or family history of seizures.   Prior AEDs: Dilantin  PAST MEDICAL HISTORY: Past Medical History  Diagnosis Date  . Glaucoma   . Seizures   . Low back pain   . Radiculopathy of sacral and sacrococcygeal region     S1  . History of abnormal weight loss   . Hearing deficit     Hearing aids  . MGUS (monoclonal gammopathy of unknown  significance)     MEDICATIONS: Current Outpatient Prescriptions on File Prior to Visit  Medication Sig Dispense Refill  . benazepril-hydrochlorthiazide (LOTENSIN HCT) 10-12.5 MG per tablet Take 1 tablet by mouth daily.      . Cholecalciferol (VITAMIN D) 2000 UNITS CAPS Take 2,000 Units by mouth daily.      . dorzolamide-timolol (COSOPT) 22.3-6.8 MG/ML ophthalmic solution       . fish oil-omega-3 fatty acids 1000 MG capsule Take 2 g by mouth daily.      Marland Kitchen glucosamine-chondroitin 500-400 MG tablet Take 1 tablet by mouth 2 (two) times daily.      . Multiple Vitamin (MULTIVITAMIN) tablet Take 1 tablet by mouth daily.      Vladimir Faster Glycol-Propyl Glycol (SYSTANE) 0.4-0.3 % SOLN Apply 1 drop to eye as needed (One drop left eye as needed).      . timolol (BETIMOL) 0.25 % ophthalmic solution Place 1-2 drops into the right eye 2 (two) times daily.      . Travoprost, BAK Free, (TRAVATAN) 0.004 % SOLN ophthalmic solution Place 1 drop into the right eye at bedtime.       No current facility-administered medications on file prior to visit.    ALLERGIES: No Known Allergies  FAMILY HISTORY: Family History  Problem Relation Age of Onset  . Parkinsonism Father   . Colon cancer Sister   . Multiple sclerosis Daughter   . Muscular dystrophy Grandchild   . Muscular dystrophy Grandchild     SOCIAL HISTORY: History   Social History  . Marital Status: Married    Spouse Name: N/A    Number of Children: 4  . Years of Education: N/A   Occupational History  . Retired    Social History Main Topics  . Smoking status: Former Smoker    Quit date: 12/03/1973  . Smokeless tobacco: Not on file  . Alcohol Use: Yes     Comment: Consumes 1-2 alcohol beverages per day  . Drug Use: No  . Sexual Activity: Not on file   Other Topics Concern  . Not on file   Social History Narrative  . No narrative on file    REVIEW OF SYSTEMS: Constitutional: No fevers, chills, or sweats, no generalized fatigue,  change in appetite Eyes: No visual changes, double vision, eye pain Ear, nose and throat: No hearing loss, ear pain, nasal congestion, sore throat Cardiovascular: No chest pain, palpitations Respiratory:  No shortness of breath at rest or with exertion, wheezes GastrointestinaI: No nausea, vomiting, diarrhea, abdominal pain, fecal incontinence Genitourinary:  No dysuria, urinary retention or frequency Musculoskeletal:  No neck pain, back pain Integumentary: No rash, pruritus, skin lesions Neurological: as above Psychiatric: No depression, insomnia, anxiety Endocrine: No palpitations, fatigue, diaphoresis, mood swings, change in appetite, change in weight, increased thirst Hematologic/Lymphatic:  No anemia, purpura, petechiae. Allergic/Immunologic: no itchy/runny eyes, nasal congestion, recent allergic reactions, rashes  PHYSICAL EXAM: Filed Vitals:   06/14/14 0957  BP: 124/60  Pulse: 77   General: No acute distress Head:  Normocephalic/atraumatic Neck: supple, no paraspinal tenderness, full range of motion Heart:  Regular rate and rhythm Lungs:  Clear to auscultation bilaterally Back: No paraspinal tenderness Skin/Extremities: No rash, no edema Neurological Exam: alert and oriented to person, place, and time. No aphasia or dysarthria. Fund of knowledge is appropriate. Recent and remote memory are intact. Attention and concentration are normal. Able to name objects and repeat phrases. Cranial nerves: Pupils equal, round, reactive to light. Extraocular movements intact with no nystagmus. Visual fields full. Facial sensation intact. No facial asymmetry. Tongue, uvula, palate midline. Motor: Bulk and tone normal, muscle strength 5/5 throughout with no pronator drift. Sensory exam : decreased cold and vibration in a stocking distribution up to the ankles bilaterally. Intact to pin, intact joint position sense. No extinction to double simultaneous stimulation. Romberg test:slight sway Deep  Tendon Reflexes: +2 throughout except for absent ankle jerks bilaterally, no clonus. Plantar responses: downgoing bilaterally Finger to nose testing: no incoordination on finger to nose testing Gait: narrow-based  and steady.   IMPRESSION:  This is a pleasant 80 yo RH woman with a history of well-controlled seizures and peripheral neuropathy. She has had an extensive evaluation for neuropathy that was unremarkable. Her EMG/NCV confirmed mild peripheral neuropathy in the left LE. She has seen Ortho for the localized pain on the left big toe, and tells me the symptoms have resolved.  She denies any paresthesias or pain in her legs today.  No seizures since 2012.  She will continue Lamotrigine 100mg  in AM, 150mg  in PM.  She had stopped the gabapentin for neuropathy.  She will follow-up in 6 months and knows to call our office for any problems in the interim.  Thank you for allowing me to participate in her care.  Please do not hesitate to call for any questions or concerns.  The duration of this appointment visit was 15 minutes of face-to-face time with the patient.  Greater than 50% of this time was spent in counseling, explanation of diagnosis, planning of further management, and coordination of care.   Ellouise Newer, M.D.   CC: Dr. Laurann Montana

## 2014-06-14 NOTE — Patient Instructions (Addendum)
1. Continue Lamotrigine 100mg : Take 1 in AM, 1-1/2 in PM 2. Follow-up in 6 months  1. If medication has been prescribed for you to prevent seizures, take it exactly as directed.  Do not stop taking the medicine without talking to your doctor first, even if you have not had a seizure in a long time.   2. Avoid activities in which a seizure would cause danger to yourself or to others.  Don't operate dangerous machinery, swim alone, or climb in high or dangerous places, such as on ladders, roofs, or girders.  Do not drive unless your doctor says you may.  3. If you have any warning that you may have a seizure, lay down in a safe place where you can't hurt yourself.    4.  No driving for 6 months from last seizure, as per [facility].   Please refer to the following link on the [place] website for more information: http://www.epilepsyfoundation.org/answerplace/Social/driving/drivingu.cfm   5.  Maintain good sleep hygiene.  6.  Contact your doctor if you have any problems that may be related to the medicine you are taking.  7.  Call 911 and bring the patient back to the ED if:        A.  The seizure lasts longer than 5 minutes.       B.  The patient doesn't awaken shortly after the seizure  C.  The patient has new problems such as difficulty seeing, speaking or moving  D.  The patient was injured during the seizure  E.  The patient has a temperature over 102 F (39C)  F.  The patient vomited and now is having trouble breathing

## 2014-06-30 ENCOUNTER — Ambulatory Visit: Payer: Medicare Other | Admitting: Neurology

## 2014-10-20 ENCOUNTER — Encounter: Payer: Self-pay | Admitting: Neurology

## 2014-10-26 ENCOUNTER — Encounter: Payer: Self-pay | Admitting: Neurology

## 2014-11-16 ENCOUNTER — Telehealth: Payer: Self-pay | Admitting: Neurology

## 2014-11-16 NOTE — Telephone Encounter (Signed)
Returned call. Patient states she's having pain low back/sacral region, which is something she's dealt with in the past. I advise that she call her pcp to let her know what was going on & they would maybe give her something for pain. Patient stated she would do that.

## 2014-11-16 NOTE — Telephone Encounter (Signed)
Pt called

## 2014-11-16 NOTE — Telephone Encounter (Signed)
Pt called wanting to speak to a nurse regarding her current condition. Please call pt # [phone]

## 2014-12-23 ENCOUNTER — Other Ambulatory Visit: Payer: Self-pay | Admitting: Dermatology

## 2015-03-10 DIAGNOSIS — L600 Ingrowing nail: Secondary | ICD-10-CM

## 2015-03-17 ENCOUNTER — Ambulatory Visit (INDEPENDENT_AMBULATORY_CARE_PROVIDER_SITE_OTHER): Payer: Medicare Other | Admitting: Podiatry

## 2015-03-17 ENCOUNTER — Encounter: Payer: Self-pay | Admitting: Podiatry

## 2015-03-17 VITALS — BP 142/71 | HR 72 | Resp 16

## 2015-03-17 DIAGNOSIS — L600 Ingrowing nail: Secondary | ICD-10-CM | POA: Diagnosis not present

## 2015-03-17 DIAGNOSIS — T847XXD Infection and inflammatory reaction due to other internal orthopedic prosthetic devices, implants and grafts, subsequent encounter: Secondary | ICD-10-CM | POA: Diagnosis not present

## 2015-03-17 DIAGNOSIS — Z9889 Other specified postprocedural states: Secondary | ICD-10-CM | POA: Diagnosis not present

## 2015-03-17 DIAGNOSIS — L603 Nail dystrophy: Secondary | ICD-10-CM

## 2015-03-17 NOTE — Progress Notes (Signed)
She presents today to complaint of painful hallux nail right foot. She states there is been loose and painful for quite some time now.  Objective: Vital signs stable she is alert and oriented 3. Pulses are palpable right foot. Hallux nail plate is loose and painful on palpation.  Assessment: Paronychia abscess hallux right.  Plan: Total nail avulsion with matrixectomy was performed after local anesthesia was achieved. She tolerated the procedure well and I will follow-up with her in 1 week. She was given both oral and written home-going instructions as to care of this foot.

## 2015-03-17 NOTE — Patient Instructions (Signed)

## 2015-03-22 ENCOUNTER — Ambulatory Visit (INDEPENDENT_AMBULATORY_CARE_PROVIDER_SITE_OTHER): Payer: Medicare Other | Admitting: Neurology

## 2015-03-22 ENCOUNTER — Encounter: Payer: Self-pay | Admitting: Neurology

## 2015-03-22 VITALS — BP 88/54 | HR 77 | Resp 16 | Ht 64.0 in | Wt 127.0 lb

## 2015-03-22 DIAGNOSIS — G40309 Generalized idiopathic epilepsy and epileptic syndromes, not intractable, without status epilepticus: Secondary | ICD-10-CM | POA: Diagnosis not present

## 2015-03-22 DIAGNOSIS — G40909 Epilepsy, unspecified, not intractable, without status epilepticus: Secondary | ICD-10-CM | POA: Diagnosis not present

## 2015-03-22 DIAGNOSIS — G609 Hereditary and idiopathic neuropathy, unspecified: Secondary | ICD-10-CM | POA: Diagnosis not present

## 2015-03-22 MED ORDER — LAMOTRIGINE 100 MG PO TABS: ORAL_TABLET | ORAL | Status: DC

## 2015-03-22 NOTE — Progress Notes (Signed)
NEUROLOGY FOLLOW UP OFFICE NOTE  Tracy Nielsen [id number]  HISTORY OF PRESENT ILLNESS: I had the pleasure of seeing Tracy Nielsen in follow-up in the neurology clinic on 03/22/2015.  The patient was last seen 9 months ago for neuropathy and seizures. She had been seeing neurologist Dr. Erling Cruz for 30 years for these conditions. She denies any seizures since 2012 when she ran out of Lamotrigine. She takes Lamotrigine 100mg  in AM, 150mg  in PM with no side effects. She has had an extensive workup for neuropathy in the past, and notes that her left foot mostly hurts when she plantarflexes it at night, so she mostly just avoids doing it. She denies any tingling, states it is not numb as there is not as much feeling in her feet ("not as agile"). She feels wobbly but denies any falls.   HPI: This is a very pleasant 81yo RH woman with a history of seizures and neuropathy. She had been seeing neurologist Dr. Morene Antu for more than 30 years after she had her first seizure in 1983 after a skiing trip and a long plane ride. She recalls feeling funny/dizzy while on the phone, then found herself on the floor with no injuries. She was admitted for 5 days at [facility], reporting that the workup was unremarkable. She was started on Dilantin, with a history of toxic levels in the past. She was able to switch to Lamictal in 2010, and has been tolerating this with no side effects. She had one breakthrough seizure on 02/28/2011 when her husband found her seizing with associated tongue bite, per records and she had ran out of lamotrigine at that time.   She has had problems with pain in her legs in the past. She has had an extensive evaluation with an EMG/NCV in 2008 that was normal. Workup showed the IgG monoclonal protein with A light chain specificity, and per records she was evaluated by hematologist Dr. Beryle Beams. It appears she had seen another hematologist recently and was told that everything  was normal. She had seen neurologists Dr. Baltazar Najjar and Jannifer Franklin in 2014, with a diagnosis of peripheral neuropathy, possibly from MGUS versus Dilantin. At one point she was on gabapentin but has stopped taking this. On review of prior records, she had reported worsening lower extremity numbness and has had had an extensive evaluation with an EMG/MCV in 2008 that was normal. Workup showed the IgG monoclonal protein with A light chain specificity, and per records she was evaluated by hematologist Dr. Beryle Beams. It appears she had seen another hematologist recently and was told that everything was normal. Most recent EMG/NCV 01/2014 done showed mild peripheral neuropathy in the left leg when she was complaining of excruciating pain going down her left leg from the knee to the foot.   Most recent lamotrigine level from notes reviewed this from December 2013 with a level of 3.6. There are no prior EEGs or MRI brain scans available for review.   Epilepsy Risk Factors: She had a normal birth and early development. There is no history of febrile convulsions, CNS infections such as meningitis/encephalitis, significant traumatic brain injury, neurosurgical procedures, or family history of seizures.   Prior AEDs: Dilantin  PAST MEDICAL HISTORY: Past Medical History  Diagnosis Date  . Glaucoma   . Seizures   . Low back pain   . Radiculopathy of sacral and sacrococcygeal region     S1  . History of abnormal weight loss   . Hearing deficit  Hearing aids  . MGUS (monoclonal gammopathy of unknown significance)     MEDICATIONS: Current Outpatient Prescriptions on File Prior to Visit  Medication Sig Dispense Refill  . benazepril-hydrochlorthiazide (LOTENSIN HCT) 10-12.5 MG per tablet Take 1 tablet by mouth daily.    . Cholecalciferol (VITAMIN D) 2000 UNITS CAPS Take 2,000 Units by mouth daily.    . dorzolamide-timolol (COSOPT) 22.3-6.8 MG/ML ophthalmic solution     . lamoTRIgine (LAMICTAL) 100 MG tablet  Take 1 tablet in morning, 1 and 1/2 tablets at night 225 tablet 3  . Polyethyl Glycol-Propyl Glycol (SYSTANE) 0.4-0.3 % SOLN Apply 1 drop to eye as needed (One drop left eye as needed).    . timolol (BETIMOL) 0.25 % ophthalmic solution Place 1-2 drops into the right eye 2 (two) times daily.    . Travoprost, BAK Free, (TRAVATAN) 0.004 % SOLN ophthalmic solution Place 1 drop into the right eye at bedtime.     No current facility-administered medications on file prior to visit.    ALLERGIES: No Known Allergies  FAMILY HISTORY: Family History  Problem Relation Age of Onset  . Parkinsonism Father   . Colon cancer Sister   . Multiple sclerosis Daughter   . Muscular dystrophy Grandchild   . Muscular dystrophy Grandchild     SOCIAL HISTORY: History   Social History  . Marital Status: Married    Spouse Name: N/A  . Number of Children: 4  . Years of Education: N/A   Occupational History  . Retired    Social History Main Topics  . Smoking status: Former Smoker    Quit date: 12/03/1973  . Smokeless tobacco: Not on file  . Alcohol Use: Yes     Comment: Consumes 1-2 alcohol beverages per day  . Drug Use: No  . Sexual Activity: Not on file   Other Topics Concern  . Not on file   Social History Narrative    REVIEW OF SYSTEMS: Constitutional: No fevers, chills, or sweats, no generalized fatigue, change in appetite Eyes: No visual changes, double vision, eye pain Ear, nose and throat: No hearing loss, ear pain, nasal congestion, sore throat Cardiovascular: No chest pain, palpitations Respiratory:  No shortness of breath at rest or with exertion, wheezes GastrointestinaI: No nausea, vomiting, diarrhea, abdominal pain, fecal incontinence Genitourinary:  No dysuria, urinary retention or frequency Musculoskeletal:  No neck pain, back pain Integumentary: No rash, pruritus, skin lesions Neurological: as above Psychiatric: No depression, insomnia, anxiety Endocrine: No  palpitations, fatigue, diaphoresis, mood swings, change in appetite, change in weight, increased thirst Hematologic/Lymphatic:  No anemia, purpura, petechiae. Allergic/Immunologic: no itchy/runny eyes, nasal congestion, recent allergic reactions, rashes  PHYSICAL EXAM: Filed Vitals:   03/22/15 0801  BP: 88/54  Pulse: 77  Resp: 16   General: No acute distress Head:  Normocephalic/atraumatic Neck: supple, no paraspinal tenderness, full range of motion Heart:  Regular rate and rhythm Lungs:  Clear to auscultation bilaterally Back: No paraspinal tenderness Skin/Extremities: No rash, no edema Neurological Exam: alert and oriented to person, place, and time. No aphasia or dysarthria. Fund of knowledge is appropriate.  Recent and remote memory are intact.  Attention and concentration are normal.    Able to name objects and repeat phrases. Cranial nerves: Pupils equal, round, reactive to light.  Fundoscopic exam unremarkable, no papilledema. Extraocular movements intact with no nystagmus. Visual fields full. Facial sensation intact. No facial asymmetry. Tongue, uvula, palate midline.  Motor: Bulk and tone normal, muscle strength 5/5 throughout with no pronator drift.  Sensation: decreased cold and vibration in stocking distribution up to ankles bilaterally, reports decreased pin on right foot, increased pin on left foot. No extinction to double simultaneous stimulation.  Deep tendon reflexes 2+ throughout except for absent ankle jerks bilaterally, toes downgoing.  Finger to nose testing intact.  Gait narrow-based and steady, mild difficulty with tandem walk but able. Romberg negative sway but patient feels wobbly.  IMPRESSION: This is a pleasant 82 yo RH woman with a history of well-controlled seizures and peripheral neuropathy. She has had an extensive evaluation for neuropathy that was overall unremarkable. Her EMG/NCV confirmed mild peripheral neuropathy in the left LE. No seizures since 2012. She  will continue Lamotrigine 100mg  in AM, 150mg  in PM. She had stopped the gabapentin for neuropathy in the past and would like to hold off on any further medication changes for now. She will follow-up in 1 year and knows to call our office for any problems in the interim.  Thank you for allowing me to participate in her care.  Please do not hesitate to call for any questions or concerns.  The duration of this appointment visit was 15 minutes of face-to-face time with the patient.  Greater than 50% of this time was spent in counseling, explanation of diagnosis, planning of further management, and coordination of care.   Ellouise Newer, M.D.   CC: Dr. Laurann Montana

## 2015-03-22 NOTE — Patient Instructions (Signed)
1. Continue Lamotrigine 100mg : Take 1 tab in AM, 1-1/2 tabs in PM 2. Continue to monitor neuropathy, call our office if symptoms worsen 3. Follow-up in 1 year  Seizure Precautions: 1. If medication has been prescribed for you to prevent seizures, take it exactly as directed.  Do not stop taking the medicine without talking to your doctor first, even if you have not had a seizure in a long time.   2. Avoid activities in which a seizure would cause danger to yourself or to others.  Don't operate dangerous machinery, swim alone, or climb in high or dangerous places, such as on ladders, roofs, or girders.  Do not drive unless your doctor says you may.  3. If you have any warning that you may have a seizure, lay down in a safe place where you can't hurt yourself.    4.  No driving for 6 months from last seizure, as per [facility].   Please refer to the following link on the [place] website for more information: http://www.epilepsyfoundation.org/answerplace/Social/driving/drivingu.cfm   5.  Maintain good sleep hygiene.  6.  Contact your doctor if you have any problems that may be related to the medicine you are taking.  7.  Call 911 and bring the patient back to the ED if:        A.  The seizure lasts longer than 5 minutes.       B.  The patient doesn't awaken shortly after the seizure  C.  The patient has new problems such as difficulty seeing, speaking or moving  D.  The patient was injured during the seizure  E.  The patient has a temperature over 102 F (39C)  F.  The patient vomited and now is having trouble breathing

## 2015-03-24 ENCOUNTER — Encounter: Payer: Self-pay | Admitting: Podiatry

## 2015-03-24 ENCOUNTER — Ambulatory Visit (INDEPENDENT_AMBULATORY_CARE_PROVIDER_SITE_OTHER): Payer: Medicare Other | Admitting: Podiatry

## 2015-03-24 DIAGNOSIS — L600 Ingrowing nail: Secondary | ICD-10-CM

## 2015-03-24 NOTE — Patient Instructions (Signed)

## 2015-03-24 NOTE — Progress Notes (Signed)
She presents today for follow-up A nail avulsion hallux right states it is still bleeding.  Objective: Vital signs are stable she's alert and oriented 3. Pulses are palpable. The nail bed is granulating in quite nicely with epithelialization approximately 50% of it is healed.  Assessment: Well nonsurgical toe hallux right.  Plan: Discontinue Betadine scrub with Epsom salts and warm water soaks continue to soak until completely resolved. However during the day and leave open at night.

## 2015-04-07 ENCOUNTER — Encounter: Payer: Self-pay | Admitting: Podiatry

## 2015-04-07 ENCOUNTER — Ambulatory Visit (INDEPENDENT_AMBULATORY_CARE_PROVIDER_SITE_OTHER): Payer: Medicare Other | Admitting: Podiatry

## 2015-04-07 DIAGNOSIS — L600 Ingrowing nail: Secondary | ICD-10-CM

## 2015-04-07 NOTE — Progress Notes (Signed)
She presents today for follow-up of a total matrixectomy hallux right. The wound has been slow to heal but she states this seems to be doing very well at this point. States that she stopped soaking it Monday of this week.  Objective: Vital signs are stable she is alert and oriented 3. There is no erythema edema saline as drainage or odor granulation tissue and epithelialization is occurring to the hallux nailbed right foot. I see no signs of infection.  Assessment: Well-healing surgical toe right.  Plan: Continue conservative therapies until completely resolved follow-up with me as needed.

## 2015-07-08 ENCOUNTER — Encounter: Payer: Self-pay | Admitting: Podiatry

## 2015-07-08 ENCOUNTER — Ambulatory Visit (INDEPENDENT_AMBULATORY_CARE_PROVIDER_SITE_OTHER): Payer: Medicare Other | Admitting: Podiatry

## 2015-07-08 VITALS — BP 115/52 | HR 81 | Resp 16

## 2015-07-08 DIAGNOSIS — G5792 Unspecified mononeuropathy of left lower limb: Secondary | ICD-10-CM

## 2015-07-10 NOTE — Progress Notes (Signed)
She presents today with a history of neuropathy complaining about a painful hallux left foot. She states that after she cut the toenail she has experienced throbbing to the toes on and off over the past few days. States that has not happened as of yet today.  Objective: I'll signs are stable she is alert and oriented 3 pulses remain palpable. Still continues to have slight deficit in neurologic sensation. Cutaneous evaluation demonstrates thicker dystrophic clinically mycotic nails but no reproducible pain on palpation on physical exam today no ulcerations no skin openings no signs of infection.  Assessment: Neuralgia possibly associated with neuropathy.  Plan: Follow up with her as needed.

## 2015-10-25 DIAGNOSIS — H35033 Hypertensive retinopathy, bilateral: Secondary | ICD-10-CM | POA: Insufficient documentation

## 2015-10-25 DIAGNOSIS — H43812 Vitreous degeneration, left eye: Secondary | ICD-10-CM | POA: Insufficient documentation

## 2015-11-09 ENCOUNTER — Ambulatory Visit (INDEPENDENT_AMBULATORY_CARE_PROVIDER_SITE_OTHER): Payer: Medicare Other | Admitting: Neurology

## 2015-11-09 ENCOUNTER — Encounter: Payer: Self-pay | Admitting: Neurology

## 2015-11-09 VITALS — BP 174/78 | HR 72 | Ht 64.0 in | Wt 130.0 lb

## 2015-11-09 DIAGNOSIS — G63 Polyneuropathy in diseases classified elsewhere: Secondary | ICD-10-CM | POA: Diagnosis not present

## 2015-11-09 DIAGNOSIS — E538 Deficiency of other specified B group vitamins: Secondary | ICD-10-CM | POA: Diagnosis not present

## 2015-11-09 DIAGNOSIS — R269 Unspecified abnormalities of gait and mobility: Secondary | ICD-10-CM

## 2015-11-09 DIAGNOSIS — G40309 Generalized idiopathic epilepsy and epileptic syndromes, not intractable, without status epilepticus: Secondary | ICD-10-CM

## 2015-11-09 DIAGNOSIS — D472 Monoclonal gammopathy: Secondary | ICD-10-CM | POA: Insufficient documentation

## 2015-11-09 HISTORY — DX: Polyneuropathy in diseases classified elsewhere: G63

## 2015-11-09 HISTORY — DX: Monoclonal gammopathy: D47.2

## 2015-11-09 NOTE — Patient Instructions (Addendum)
We will check blood work today, and get MRI of the brain to evaluate the memory disorder and balance problems.   Fall Prevention in the Home  Falls can cause injuries and can affect people from all age groups. There are many simple things that you can do to make your home safe and to help prevent falls. WHAT CAN I DO ON THE OUTSIDE OF MY HOME?  Regularly repair the edges of walkways and driveways and fix any cracks.  Remove high doorway thresholds.  Trim any shrubbery on the main path into your home.  Use bright outdoor lighting.  Clear walkways of debris and clutter, including tools and rocks.  Regularly check that handrails are securely fastened and in good repair. Both sides of any steps should have handrails.  Install guardrails along the edges of any raised decks or porches.  Have leaves, snow, and ice cleared regularly.  Use sand or salt on walkways during winter months.  In the garage, clean up any spills right away, including grease or oil spills. WHAT CAN I DO IN THE BATHROOM?  Use night lights.  Install grab bars by the toilet and in the tub and shower. Do not use towel bars as grab bars.  Use non-skid mats or decals on the floor of the tub or shower.  If you need to sit down while you are in the shower, use a plastic, non-slip stool.[employer]land Kitchen  Keep the floor dry. Immediately clean up any water that spills on the floor.  Remove soap buildup in the tub or shower on a regular basis.  Attach bath mats securely with double-sided non-slip rug tape.  Remove throw rugs and other tripping hazards from the floor. WHAT CAN I DO IN THE BEDROOM?  Use night lights.  Make sure that a bedside light is easy to reach.  Do not use oversized bedding that drapes onto the floor.  Have a firm chair that has side arms to use for getting dressed.  Remove throw rugs and other tripping hazards from the floor. WHAT CAN I DO IN THE KITCHEN?   Clean up any spills right away.  Avoid  walking on wet floors.  Place frequently used items in easy-to-reach places.  If you need to reach for something above you, use a sturdy step stool that has a grab bar.  Keep electrical cables out of the way.  Do not use floor polish or wax that makes floors slippery. If you have to use wax, make sure that it is non-skid floor wax.  Remove throw rugs and other tripping hazards from the floor. WHAT CAN I DO IN THE STAIRWAYS?  Do not leave any items on the stairs.  Make sure that there are handrails on both sides of the stairs. Fix handrails that are broken or loose. Make sure that handrails are as long as the stairways.  Check any carpeting to make sure that it is firmly attached to the stairs. Fix any carpet that is loose or worn.  Avoid having throw rugs at the top or bottom of stairways, or secure the rugs with carpet tape to prevent them from moving.  Make sure that you have a light switch at the top of the stairs and the bottom of the stairs. If you do not have them, have them installed. WHAT ARE SOME OTHER FALL PREVENTION TIPS?  Wear closed-toe shoes that fit well and support your feet. Wear shoes that have rubber soles or low heels.  When you use  a stepladder, make sure that it is completely opened and that the sides are firmly locked. Have someone hold the ladder while you are using it. Do not climb a closed stepladder.  Add color or contrast paint or tape to grab bars and handrails in your home. Place contrasting color strips on the first and last steps.  Use mobility aids as needed, such as canes, walkers, scooters, and crutches.  Turn on lights if it is dark. Replace any light bulbs that burn out.  Set up furniture so that there are clear paths. Keep the furniture in the same spot.  Fix any uneven floor surfaces.  Choose a carpet design that does not hide the edge of steps of a stairway.  Be aware of any and all pets.  Review your medicines with your healthcare  provider. Some medicines can cause dizziness or changes in blood pressure, which increase your risk of falling. Talk with your health care provider about other ways that you can decrease your risk of falls. This may include working with a physical therapist or trainer to improve your strength, balance, and endurance.   This information is not intended to replace advice given to you by your health care provider. Make sure you discuss any questions you have with your health care provider.   Document Released: 11/09/2002 Document Revised: 04/05/2015 Document Reviewed: 12/24/2014 Elsevier Interactive Patient Education [employer].

## 2015-11-09 NOTE — Progress Notes (Signed)
Reason for visit: Peripheral neuropathy  Referring physician: Dr. [name] is a 81 y.o. female  History of present illness:  Tracy Nielsen is an 81 year old right-handed white female with a history of a peripheral neuropathy associated with a MGUS. The patient has been seen previously by Tracy Nielsen, last seen in 2013. The patient also has a history of seizures, but she has not had any seizures in quite a number of years, the last documented seizure in our records is 22. The patient has had some balance issues for number of years, she was taken off of Dilantin, and switched to lamotrigine. She has done well with this medication. She does operate a Teacher, music. She has some numbness in both feet, slightly worse on the left. She comes to the office today with reports of increased discomfort in the feet, particularly on the left. When getting a careful history, it appears that the discomfort the patient is talking about is actually secondary to nocturnal cramping when she stretches her legs out, she will get a cramp in the left foot. She has noted that if she avoids stretching, she can avoid the pain. Otherwise she does not have any burning or stinging in the feet. She has however had some issues with worsening gait instability, she has not had any falls. She does not use a cane for ambulation. She has had some alteration in memory over the last one year, mainly with remembering names for people. The patient herself does not believe that the memory issues are significant. The patient indicates that she sleeps well at night. She denies issues controlling the bowels or the bladder. She has not had any headache issues. She has been seen previously by Dr. Delice Nielsen, but she comes back to this office for further evaluation.  Past Medical History  Diagnosis Date  . Glaucoma   . Seizures (Tracy Nielsen)   . Low back pain   . Radiculopathy of sacral and sacrococcygeal region     S1  . History of  abnormal weight loss   . Hearing deficit     Hearing aids  . MGUS (monoclonal gammopathy of unknown significance)   . Neuropathy associated with MGUS ([place]) 11/09/2015    Past Surgical History  Procedure Laterality Date  . Back surgery    . Ovary surgery    . Appendectomy    . Parathyroidectomy  9/  . Trabeculectomy Left   . Cataract extraction Left     Family History  Problem Relation Age of Onset  . Parkinsonism Father   . Colon cancer Sister   . Multiple sclerosis Daughter   . Muscular dystrophy Grandchild   . Muscular dystrophy Grandchild     Social history:  reports that she quit smoking about 41 years ago. She has never used smokeless tobacco. She reports that she drinks alcohol. She reports that she does not use illicit drugs.  Medications:  Prior to Admission medications   Medication Sig Start Date End Date Taking? Authorizing Provider  benazepril-hydrochlorthiazide (LOTENSIN HCT) 10-12.5 MG per tablet Take 1 tablet by mouth daily.   Yes Historical Provider, MD  Cholecalciferol (VITAMIN D) 2000 UNITS CAPS Take 2,000 Units by mouth daily.   Yes Historical Provider, MD  dorzolamide-timolol (COSOPT) 22.3-6.8 MG/ML ophthalmic solution  01/18/14  Yes Historical Provider, MD  lamoTRIgine (LAMICTAL) 100 MG tablet Take 1 tablet in morning, 1 and 1/2 tablets at night 03/22/15  Yes Tracy Sprang, MD  Polyethyl Glycol-Propyl Glycol (  SYSTANE) 0.4-0.3 % SOLN Apply 1 drop to eye as needed (One drop left eye as needed).   Yes Historical Provider, MD  timolol (BETIMOL) 0.25 % ophthalmic solution Place 1-2 drops into the right eye 2 (two) times daily.   Yes Historical Provider, MD  Travoprost, BAK Free, (TRAVATAN) 0.004 % SOLN ophthalmic solution Place 1 drop into the right eye at bedtime.   Yes Historical Provider, MD     No Known Allergies  ROS:  Out of a complete 14 system review of symptoms, the patient complains only of the following symptoms, and all other reviewed systems are  negative.  Hearing loss Itching Memory disturbance Leg pain  Blood pressure 174/78, pulse 72, height 5\' 4"  (1.626 m), weight 130 lb (58.968 kg).  Physical Exam  General: The patient is alert and cooperative at the time of the examination.  Eyes: Pupils are equal, round, and reactive to light. Discs are flat bilaterally.  Neck: The neck is supple, no carotid bruits are noted.  Respiratory: The respiratory examination is clear.  Cardiovascular: The cardiovascular examination reveals a regular rate and rhythm, no obvious murmurs or rubs are noted.  Skin: Extremities are without significant edema.  Neurologic Exam  Mental status: The patient is alert and oriented x 3 at the time of the examination. The patient has apparent normal recent and remote memory, with an apparently normal attention span and concentration ability. Mini-Mental Status Examination done today shows a total score 29/30. The patient is able to name 13 animals in 30 seconds.  Cranial nerves: Facial symmetry is not present. The patient has ptosis of the left eye. There is good sensation of the face to pinprick and soft touch bilaterally. The strength of the facial muscles and the muscles to head turning and shoulder shrug are normal bilaterally. Speech is well enunciated, no aphasia or dysarthria is noted. Extraocular movements are full. Visual fields are full. The tongue is midline, and the patient has symmetric elevation of the soft palate. No obvious hearing deficits are noted.  Motor: The motor testing reveals 5 over 5 strength of all 4 extremities. Good symmetric motor tone is noted throughout.  Sensory: Sensory testing is intact to pinprick, soft touch, vibration sensation, and position sense on all 4 extremities, with exception of a stocking pattern pinprick sensory deficit one half way up the legs bilaterally. No evidence of extinction is noted.  Coordination: Cerebellar testing reveals good finger-nose-finger  and heel-to-shin bilaterally.  Gait and station: Gait is slightly wide-based, unsteady. Tandem gait is unsteady. Romberg is negative. No drift is seen.  Reflexes: Deep tendon reflexes are symmetric, but are depressed bilaterally. Toes are downgoing bilaterally.   Assessment/Plan:  1. Peripheral neuropathy associated with MGUS  2. Minimum cognitive impairment  3. Nocturnal leg cramps  4. Seizures, well controlled  The patient appears to have a mild memory disturbance, she is not concerned about it at this point, she does not wish to have treatment. We will follow the memory issues over time. The balance and gait have worsened over time, we will check MRI evaluation of the brain, and check some blood work today. She is having some discomfort in the left foot primarily with nocturnal leg cramps, but she does not appear to have any significant neuropathy based pain. There is no indication for medications for discomfort. The patient will follow-up in 6 months for reevaluation. She is to remain on the Lamictal at this time.  Jill Alexanders MD 11/09/2015 7:35 PM  [facility] [address] [place], [place] [address]  Phone [phone] Fax [phone]

## 2015-11-10 LAB — COMPREHENSIVE METABOLIC PANEL
ALT: 11 IU/L (ref 0–32)
AST: 20 IU/L (ref 0–40)
Albumin/Globulin Ratio: 2.4 (ref 1.1–2.5)
Albumin: 4.5 g/dL (ref 3.5–4.7)
Alkaline Phosphatase: 57 IU/L (ref 39–117)
BILIRUBIN TOTAL: 0.3 mg/dL (ref 0.0–1.2)
BUN/Creatinine Ratio: 17 (ref 11–26)
BUN: 17 mg/dL (ref 8–27)
CO2: 26 mmol/L (ref 18–29)
Calcium: 9.7 mg/dL (ref 8.7–10.3)
Chloride: 99 mmol/L (ref 97–106)
Creatinine, Ser: 0.98 mg/dL (ref 0.57–1.00)
GFR calc Af Amer: 62 mL/min/{1.73_m2} (ref >59)
GFR calc non Af Amer: 54 mL/min/{1.73_m2} — ABNORMAL LOW (ref >59)
Globulin, Total: 1.9 g/dL (ref 1.5–4.5)
Glucose: 94 mg/dL (ref 65–99)
Potassium: 5.4 mmol/L — ABNORMAL HIGH (ref 3.5–5.2)
Sodium: 137 mmol/L (ref 136–144)
Total Protein: 6.4 g/dL (ref 6.0–8.5)

## 2015-11-10 LAB — ANGIOTENSIN CONVERTING ENZYME: Angio Convert Enzyme: 65 U/L (ref 14–82)

## 2015-11-10 LAB — ANA W/REFLEX: ANA: NEGATIVE

## 2015-11-10 LAB — RPR: RPR: NONREACTIVE

## 2015-11-10 LAB — VITAMIN B12: Vitamin B-12: 668 pg/mL (ref 211–946)

## 2015-11-10 LAB — LAMOTRIGINE LEVEL: LAMOTRIGINE LVL: 4.6 ug/mL (ref 2.0–20.0)

## 2015-11-19 ENCOUNTER — Ambulatory Visit
Admission: RE | Admit: 2015-11-19 | Discharge: 2015-11-19 | Disposition: A | Discharge status: Home / Self Care | Payer: Medicare Other | Source: Ambulatory Visit | Attending: Neurology | Admitting: Neurology

## 2015-11-19 DIAGNOSIS — R269 Unspecified abnormalities of gait and mobility: Secondary | ICD-10-CM | POA: Diagnosis not present

## 2015-11-19 DIAGNOSIS — G63 Polyneuropathy in diseases classified elsewhere: Secondary | ICD-10-CM

## 2015-11-19 DIAGNOSIS — G40309 Generalized idiopathic epilepsy and epileptic syndromes, not intractable, without status epilepticus: Secondary | ICD-10-CM

## 2015-11-19 DIAGNOSIS — D472 Monoclonal gammopathy: Secondary | ICD-10-CM

## 2015-11-20 ENCOUNTER — Telehealth: Payer: Self-pay | Admitting: Neurology

## 2015-11-20 NOTE — Telephone Encounter (Signed)
I called the patient. The MRI of the brain showed a moderate level of SVD, some atrophy. She is to go on ASA. Her BP was elevated on the last visit, but she indicated that she did not take her BP medication that morning.  MRI brain 11/19/15:  IMPRESSION: This MRI of the brain without contrast shows the following: 1. Chronic lacunar and microvascular ischemic changes in the white matter of both hemispheres. 2. Mild chronic maxillary sinusitis 3. There are no acute findings.

## 2015-11-30 DIAGNOSIS — H401122 Primary open-angle glaucoma, left eye, moderate stage: Secondary | ICD-10-CM | POA: Insufficient documentation

## 2016-01-20 ENCOUNTER — Ambulatory Visit (INDEPENDENT_AMBULATORY_CARE_PROVIDER_SITE_OTHER): Payer: Medicare Other | Admitting: Neurology

## 2016-01-20 ENCOUNTER — Encounter: Payer: Self-pay | Admitting: Neurology

## 2016-01-20 VITALS — BP 118/64 | HR 96 | Ht 64.0 in | Wt 130.0 lb

## 2016-01-20 DIAGNOSIS — G40309 Generalized idiopathic epilepsy and epileptic syndromes, not intractable, without status epilepticus: Secondary | ICD-10-CM

## 2016-01-20 DIAGNOSIS — G609 Hereditary and idiopathic neuropathy, unspecified: Secondary | ICD-10-CM

## 2016-01-20 MED ORDER — LAMOTRIGINE 100 MG PO TABS: ORAL_TABLET | ORAL | Status: DC

## 2016-01-20 NOTE — Progress Notes (Signed)
NEUROLOGY FOLLOW UP OFFICE NOTE  Tracy Nielsen VO:[id number]  HISTORY OF PRESENT ILLNESS: I had the pleasure of seeing Tracy Nielsen in follow-up in the neurology clinic on 01/20/2016.  The patient was last seen 10 months ago for neuropathy and seizures. She had been seeing neurologist Dr. Erling Cruz for 30 years for these conditions. She denies any seizures since 2012 when she ran out of Lamotrigine. She takes Lamotrigine 100mg  in AM, 150mg  in PM with no side effects. She has had an extensive workup for neuropathy in the past. On her last visit, she reported her left foot mostly hurts when she plantarflexes it at night, so she mostly just avoided doing it. In the interim, she states she had been trying to get an earlier appointment but could not, and was evaluated by neurologist Dr. Jannifer Franklin last December 2016 for increased discomfort in her feet, particularly on the left, felt to be due to nocturnal leg cramps, no indication for medication. Due to report of worsening gait and balance, an MRI brain was done which showed chronic microvascular and lacunar ischemic changes. She reports that since then, she only has the excruciating pain at night when she plantarflexes her left foot, otherwise she does not have any pain during the daytime. She exercises her feet and has found this helps. She denies any headaches, dizziness, diplopia, focal numbness/tingling/weakness, no falls.   HPI: This is a very pleasant 82yo RH woman with a history of seizures and neuropathy. She had been seeing neurologist Dr. Morene Antu for more than 30 years after she had her first seizure in 1983 after a skiing trip and a long plane ride. She recalls feeling funny/dizzy while on the phone, then found herself on the floor with no injuries. She was admitted for 5 days at [facility], reporting that the workup was unremarkable. She was started on Dilantin, with a history of toxic levels in the past. She was able to switch to  Lamictal in 2010, and has been tolerating this with no side effects. She had one breakthrough seizure on 02/28/2011 when her husband found her seizing with associated tongue bite, per records and she had ran out of lamotrigine at that time.   She has had problems with pain in her legs in the past. She has had an extensive evaluation with an EMG/NCV in 2008 that was normal. Workup showed the IgG monoclonal protein with A light chain specificity, and per records she was evaluated by hematologist Dr. Beryle Beams. It appears she had seen another hematologist recently and was told that everything was normal. She had seen neurologists Dr. Baltazar Najjar and Jannifer Franklin in 2014, with a diagnosis of peripheral neuropathy, possibly from MGUS versus Dilantin. At one point she was on gabapentin but has stopped taking this. On review of prior records, she had reported worsening lower extremity numbness and has had had an extensive evaluation with an EMG/MCV in 2008 that was normal. Workup showed the IgG monoclonal protein with A light chain specificity, and per records she was evaluated by hematologist Dr. Beryle Beams. It appears she had seen another hematologist recently and was told that everything was normal. Most recent EMG/NCV 01/2014 done showed mild peripheral neuropathy in the left leg when she was complaining of excruciating pain going down her left leg from the knee to the foot.   Most recent lamotrigine level from notes reviewed this from December 2013 with a level of 3.6. There are no prior EEGs or MRI brain scans available for review.  Epilepsy Risk Factors: She had a normal birth and early development. There is no history of febrile convulsions, CNS infections such as meningitis/encephalitis, significant traumatic brain injury, neurosurgical procedures, or family history of seizures.   Prior AEDs: Dilantin  PAST MEDICAL HISTORY: Past Medical History  Diagnosis Date  . Glaucoma   . Seizures ([place])   . Low back  pain   . Radiculopathy of sacral and sacrococcygeal region     S1  . History of abnormal weight loss   . Hearing deficit     Hearing aids  . MGUS (monoclonal gammopathy of unknown significance)   . Neuropathy associated with MGUS ([place]) 11/09/2015    MEDICATIONS: Current Outpatient Prescriptions on File Prior to Visit  Medication Sig Dispense Refill  . aspirin 81 MG tablet Take 81 mg by mouth daily.    . benazepril-hydrochlorthiazide (LOTENSIN HCT) 10-12.5 MG per tablet Take 1 tablet by mouth daily.    . Cholecalciferol (VITAMIN D) 2000 UNITS CAPS Take 2,000 Units by mouth daily.    . dorzolamide-timolol (COSOPT) 22.3-6.8 MG/ML ophthalmic solution     . lamoTRIgine (LAMICTAL) 100 MG tablet Take 1 tablet in morning, 1 and 1/2 tablets at night 225 tablet 3  . Polyethyl Glycol-Propyl Glycol (SYSTANE) 0.4-0.3 % SOLN Apply 1 drop to eye as needed (One drop left eye as needed).    . timolol (BETIMOL) 0.25 % ophthalmic solution Place 1-2 drops into the right eye 2 (two) times daily.    . Travoprost, BAK Free, (TRAVATAN) 0.004 % SOLN ophthalmic solution Place 1 drop into the right eye at bedtime.     No current facility-administered medications on file prior to visit.    ALLERGIES: No Known Allergies  FAMILY HISTORY: Family History  Problem Relation Age of Onset  . Parkinsonism Father   . Colon cancer Sister   . Multiple sclerosis Daughter   . Muscular dystrophy Grandchild   . Muscular dystrophy Grandchild     SOCIAL HISTORY: Social History   Social History  . Marital Status: Married    Spouse Name: N/A  . Number of Children: 4  . Years of Education: 14   Occupational History  . Retired    Social History Main Topics  . Smoking status: Former Smoker    Quit date: 12/03/1973  . Smokeless tobacco: Never Used  . Alcohol Use: Yes     Comment: Consumes 1-2 alcohol beverages per day  . Drug Use: No  . Sexual Activity: Not on file   Other Topics Concern  . Not on file    Social History Narrative   Patient drinks about 2 cups of caffeine daily.   Patient is right handed.     REVIEW OF SYSTEMS: Constitutional: No fevers, chills, or sweats, no generalized fatigue, change in appetite Eyes: No visual changes, double vision, eye pain Ear, nose and throat: No hearing loss, ear pain, nasal congestion, sore throat Cardiovascular: No chest pain, palpitations Respiratory:  No shortness of breath at rest or with exertion, wheezes GastrointestinaI: No nausea, vomiting, diarrhea, abdominal pain, fecal incontinence Genitourinary:  No dysuria, urinary retention or frequency Musculoskeletal:  No neck pain, back pain Integumentary: No rash, pruritus, skin lesions Neurological: as above Psychiatric: No depression, insomnia, anxiety Endocrine: No palpitations, fatigue, diaphoresis, mood swings, change in appetite, change in weight, increased thirst Hematologic/Lymphatic:  No anemia, purpura, petechiae. Allergic/Immunologic: no itchy/runny eyes, nasal congestion, recent allergic reactions, rashes  PHYSICAL EXAM: Filed Vitals:   01/20/16 1416  BP: 118/64  Pulse:  96   General: No acute distress Head:  Normocephalic/atraumatic Neck: supple, no paraspinal tenderness, full range of motion Heart:  Regular rate and rhythm Lungs:  Clear to auscultation bilaterally Back: No paraspinal tenderness Skin/Extremities: No rash, no edema Neurological Exam: alert and oriented to person, place, and time. No aphasia or dysarthria. Fund of knowledge is appropriate.  Recent and remote memory are intact.  Attention and concentration are normal.    Able to name objects and repeat phrases. Cranial nerves: Pupils equal, round, reactive to light.  Fundoscopic exam unremarkable, no papilledema. Extraocular movements intact with no nystagmus. Visual fields full. Facial sensation intact. No facial asymmetry. Tongue, uvula, palate midline.  Motor: Bulk and tone normal, muscle strength 5/5  throughout with no pronator drift.  Sensation: decreased cold and vibration in stocking distribution up to ankles bilaterally. No extinction to double simultaneous stimulation.  Deep tendon reflexes 2+ throughout except for absent ankle jerks bilaterally, toes downgoing.  Finger to nose testing intact.  Gait narrow-based and steady, mild difficulty with tandem walk but able. Romberg negative.  IMPRESSION: This is a pleasant 82 yo RH woman with a history of well-controlled seizures and peripheral neuropathy felt due to be secondary to MGUS. Her EMG/NCV confirmed mild peripheral neuropathy in the left LE. No seizures since 2012. She will continue Lamotrigine 100mg  in AM, 150mg  in PM. Her main concern has been pain with left foot plantarflexion only occurring at night, she has mostly avoided this and denies any pain otherwise. No indication to start medication at this time, which she agrees with. She will follow-up in 7 months and knows to call our office for any problems in the interim.  Thank you for allowing me to participate in her care.  Please do not hesitate to call for any questions or concerns.  The duration of this appointment visit was 25 minutes of face-to-face time with the patient.  Greater than 50% of this time was spent in counseling, explanation of diagnosis, planning of further management, and coordination of care.   Ellouise Newer, M.D.   CC: Dr. Laurann Montana

## 2016-01-20 NOTE — Patient Instructions (Signed)
1. Continue Lamotrigine 100mg  in AM, 150mg  in PM 2. Continue avoidance of extending left foot to avoid pain. If symptoms worsen, call our office 3. Follow-up in 7 months, call for any changes

## 2016-03-28 ENCOUNTER — Telehealth: Payer: Self-pay | Admitting: Neurology

## 2016-03-28 NOTE — Telephone Encounter (Signed)
Pt called back, msg relayed. She will call pharmacy

## 2016-03-28 NOTE — Telephone Encounter (Signed)
Patient is calling to get a new Rx for lamoTRIgine (LAMICTAL) 100 MG tablet 3 month supply called to [facility] on [address].  The patient says Dr. Delice Lesch did prescribe this medication in the past but the pateint states she does not see her anymore.

## 2016-03-28 NOTE — Telephone Encounter (Signed)
Returned TC and spoke to pt's husband. Informed that pt had Lamictal rx sent into [facility] in February w/ 3 refills. Agreed to contact pharmacy for refill.

## 2016-05-09 ENCOUNTER — Ambulatory Visit (INDEPENDENT_AMBULATORY_CARE_PROVIDER_SITE_OTHER): Payer: Medicare Other | Admitting: Neurology

## 2016-05-09 ENCOUNTER — Encounter: Payer: Self-pay | Admitting: Neurology

## 2016-05-09 VITALS — BP 138/64 | HR 78 | Ht 64.0 in | Wt 125.5 lb

## 2016-05-09 DIAGNOSIS — G40309 Generalized idiopathic epilepsy and epileptic syndromes, not intractable, without status epilepticus: Secondary | ICD-10-CM

## 2016-05-09 DIAGNOSIS — R269 Unspecified abnormalities of gait and mobility: Secondary | ICD-10-CM | POA: Diagnosis not present

## 2016-05-09 DIAGNOSIS — R413 Other amnesia: Secondary | ICD-10-CM | POA: Diagnosis not present

## 2016-05-09 HISTORY — DX: Other amnesia: R41.3

## 2016-05-09 NOTE — Patient Instructions (Signed)
Fall Prevention in the Home  Falls can cause injuries and can affect people from all age groups. There are many simple things that you can do to make your home safe and to help prevent falls. WHAT CAN I DO ON THE OUTSIDE OF MY HOME?  Regularly repair the edges of walkways and driveways and fix any cracks.  Remove high doorway thresholds.  Trim any shrubbery on the main path into your home.  Use bright outdoor lighting.  Clear walkways of debris and clutter, including tools and rocks.  Regularly check that handrails are securely fastened and in good repair. Both sides of any steps should have handrails.  Install guardrails along the edges of any raised decks or porches.  Have leaves, snow, and ice cleared regularly.  Use sand or salt on walkways during winter months.  In the garage, clean up any spills right away, including grease or oil spills. WHAT CAN I DO IN THE BATHROOM?  Use night lights.  Install grab bars by the toilet and in the tub and shower. Do not use towel bars as grab bars.  Use non-skid mats or decals on the floor of the tub or shower.  If you need to sit down while you are in the shower, use a plastic, non-slip stool..  Keep the floor dry. Immediately clean up any water that spills on the floor.  Remove soap buildup in the tub or shower on a regular basis.  Attach bath mats securely with double-sided non-slip rug tape.  Remove throw rugs and other tripping hazards from the floor. WHAT CAN I DO IN THE BEDROOM?  Use night lights.  Make sure that a bedside light is easy to reach.  Do not use oversized bedding that drapes onto the floor.  Have a firm chair that has side arms to use for getting dressed.  Remove throw rugs and other tripping hazards from the floor. WHAT CAN I DO IN THE KITCHEN?   Clean up any spills right away.  Avoid walking on wet floors.  Place frequently used items in easy-to-reach places.  If you need to reach for something  above you, use a sturdy step stool that has a grab bar.  Keep electrical cables out of the way.  Do not use floor polish or wax that makes floors slippery. If you have to use wax, make sure that it is non-skid floor wax.  Remove throw rugs and other tripping hazards from the floor. WHAT CAN I DO IN THE STAIRWAYS?  Do not leave any items on the stairs.  Make sure that there are handrails on both sides of the stairs. Fix handrails that are broken or loose. Make sure that handrails are as long as the stairways.  Check any carpeting to make sure that it is firmly attached to the stairs. Fix any carpet that is loose or worn.  Avoid having throw rugs at the top or bottom of stairways, or secure the rugs with carpet tape to prevent them from moving.  Make sure that you have a light switch at the top of the stairs and the bottom of the stairs. If you do not have them, have them installed. WHAT ARE SOME OTHER FALL PREVENTION TIPS?  Wear closed-toe shoes that fit well and support your feet. Wear shoes that have rubber soles or low heels.  When you use a stepladder, make sure that it is completely opened and that the sides are firmly locked. Have someone hold the ladder while you   are using it. Do not climb a closed stepladder.  Add color or contrast paint or tape to grab bars and handrails in your home. Place contrasting color strips on the first and last steps.  Use mobility aids as needed, such as canes, walkers, scooters, and crutches.  Turn on lights if it is dark. Replace any light bulbs that burn out.  Set up furniture so that there are clear paths. Keep the furniture in the same spot.  Fix any uneven floor surfaces.  Choose a carpet design that does not hide the edge of steps of a stairway.  Be aware of any and all pets.  Review your medicines with your healthcare provider. Some medicines can cause dizziness or changes in blood pressure, which increase your risk of falling. Talk  with your health care provider about other ways that you can decrease your risk of falls. This may include working with a physical therapist or trainer to improve your strength, balance, and endurance.   This information is not intended to replace advice given to you by your health care provider. Make sure you discuss any questions you have with your health care provider.   Document Released: 11/09/2002 Document Revised: 04/05/2015 Document Reviewed: 12/24/2014 Elsevier Interactive Patient Education 2016 [employer]  

## 2016-05-09 NOTE — Progress Notes (Signed)
Reason for visit: Memory disorder  Tracy Nielsen is an 82 y.o. female  History of present illness:  Tracy Nielsen is an 82 year old right-handed white female with a history of a mild memory disturbance, and a gait disturbance. MRI of the brain has shown a moderate level small vessel ischemic changes which may be the source of both issues. The patient is on aspirin therapy. The patient is not having falls since last seen. She denies any significant progression of her memory. She has difficulty with names for people and things, otherwise she functions fairly well. She operates a Teacher, music without difficulty. She has not had a seizure in many years. She is on Lamictal, tolerating the medication well. She returns this office for an evaluation. She denies any significant new medical issues that have come up since last seen. The patient has a peripheral neuropathy, associated with MGUS.  Past Medical History  Diagnosis Date  . Glaucoma   . Seizures ([place])   . Low back pain   . Radiculopathy of sacral and sacrococcygeal region     S1  . History of abnormal weight loss   . Hearing deficit     Hearing aids  . MGUS (monoclonal gammopathy of unknown significance)   . Neuropathy associated with MGUS ([place]) 11/09/2015  . Memory difficulty 05/09/2016    Past Surgical History  Procedure Laterality Date  . Back surgery    . Ovary surgery    . Appendectomy    . Parathyroidectomy  9/  . Trabeculectomy Left   . Cataract extraction Left     Family History  Problem Relation Age of Onset  . Parkinsonism Father   . Colon cancer Sister   . Multiple sclerosis Daughter   . Muscular dystrophy Grandchild   . Muscular dystrophy Grandchild     Social history:  reports that she quit smoking about 42 years ago. She has never used smokeless tobacco. She reports that she drinks alcohol. She reports that she does not use illicit drugs.   No Known Allergies  Medications:  Prior to Admission  medications   Medication Sig Start Date End Date Taking? Authorizing Provider  aspirin 81 MG tablet Take 81 mg by mouth daily.   Yes Historical Provider, MD  Cholecalciferol (VITAMIN D) 2000 UNITS CAPS Take 2,000 Units by mouth daily.   Yes Historical Provider, MD  dorzolamide-timolol (COSOPT) 22.3-6.8 MG/ML ophthalmic solution  01/18/14  Yes Historical Provider, MD  lamoTRIgine (LAMICTAL) 100 MG tablet Take 1 tablet in morning, 1 and 1/2 tablets at night 01/20/16  Yes Cameron Sprang, MD  losartan (COZAAR) 50 MG tablet Take 50 mg by mouth daily. 04/30/16  Yes Historical Provider, MD  LUMIGAN 0.01 % SOLN PLACE 1 DROP INTO THE RIGHT EYE NIGHTLY. 04/05/16  Yes Historical Provider, MD  Polyethyl Glycol-Propyl Glycol (SYSTANE) 0.4-0.3 % SOLN Apply 1 drop to eye as needed (One drop left eye as needed).   Yes Historical Provider, MD  timolol (BETIMOL) 0.25 % ophthalmic solution Place 1-2 drops into the right eye 2 (two) times daily.   Yes Historical Provider, MD  Travoprost, BAK Free, (TRAVATAN) 0.004 % SOLN ophthalmic solution Place 1 drop into the right eye at bedtime.   Yes Historical Provider, MD    ROS:  Out of a complete 14 system review of symptoms, the patient complains only of the following symptoms, and all other reviewed systems are negative.  Hearing loss Memory loss Skin rash  Blood pressure 138/64, pulse  78, height 5\' 4"  (1.626 m), weight 125 lb 8 oz (56.926 kg).  Physical Exam  General: The patient is alert and cooperative at the time of the examination.  Skin: No significant peripheral edema is noted.   Neurologic Exam  Mental status: The patient is alert and oriented x 3 at the time of the examination. The patient has apparent normal recent and remote memory, with an apparently normal attention span and concentration ability.Mini-Mental Status Examination done today shows a total score of 28/30.   Cranial nerves: Facial symmetry is present. Speech is normal, no aphasia or  dysarthria is noted. Extraocular movements are full. Visual fields are full.  Motor: The patient has good strength in all 4 extremities.  Sensory examination: Soft touch sensation is symmetric on the face, arms, and legs.  Coordination: The patient has good finger-nose-finger and heel-to-shin bilaterally.  Gait and station: The patient has a slightly wide-based, unsteady gait. Tandem gait is unsteady. Romberg is negative. No drift is seen.  Reflexes: Deep tendon reflexes are symmetric, but are depressed.   Assessment/Plan:  1. Mild memory disturbance  2. Gait disturbance  3. Peripheral neuropathy  4. MGUS  5. History of seizures, well controlled  The patient will be maintained on the Lamictal. She reports good stability with memory and gait. She does not wish to go on any medications for memory at this time. We will follow her conservatively, she will come back in about 8 months. She will call if she has any questions.  Jill Alexanders MD 05/09/2016 5:51 PM  [facility] [address] [place], [place] [address]  Phone [phone] Fax [phone]

## 2016-07-19 ENCOUNTER — Ambulatory Visit (INDEPENDENT_AMBULATORY_CARE_PROVIDER_SITE_OTHER): Payer: Medicare Other | Admitting: Podiatry

## 2016-07-19 ENCOUNTER — Encounter: Payer: Self-pay | Admitting: Podiatry

## 2016-07-19 VITALS — BP 138/88 | HR 69 | Resp 12

## 2016-07-19 DIAGNOSIS — L603 Nail dystrophy: Secondary | ICD-10-CM

## 2016-07-19 NOTE — Progress Notes (Signed)
She presents today chief complaint of painful elongated toenails. She states they're starting to bother me again.  Objective: Vital signs are stable alert and oriented 3. Pulses are palpable. Her toenails are thick yellow dystrophic possibly mycotic. She does have some dermatophytosis or what appears to be tinea to the skin as well.  Assessment: Possible onychomycosis nail dystrophy bilateral.  Plan: To accept the skin and nail today since her pathologic evaluation.

## 2016-08-02 ENCOUNTER — Ambulatory Visit: Payer: Medicare Other | Admitting: Podiatry

## 2016-08-23 ENCOUNTER — Ambulatory Visit: Payer: Medicare Other | Admitting: Neurology

## 2017-01-09 ENCOUNTER — Encounter: Payer: Self-pay | Admitting: Neurology

## 2017-01-09 ENCOUNTER — Ambulatory Visit (INDEPENDENT_AMBULATORY_CARE_PROVIDER_SITE_OTHER): Payer: Medicare Other | Admitting: Neurology

## 2017-01-09 VITALS — BP 147/77 | HR 77 | Ht 64.0 in | Wt 127.5 lb

## 2017-01-09 DIAGNOSIS — R413 Other amnesia: Secondary | ICD-10-CM

## 2017-01-09 DIAGNOSIS — R269 Unspecified abnormalities of gait and mobility: Secondary | ICD-10-CM | POA: Diagnosis not present

## 2017-01-09 DIAGNOSIS — D472 Monoclonal gammopathy: Secondary | ICD-10-CM | POA: Diagnosis not present

## 2017-01-09 DIAGNOSIS — G40309 Generalized idiopathic epilepsy and epileptic syndromes, not intractable, without status epilepticus: Secondary | ICD-10-CM | POA: Diagnosis not present

## 2017-01-09 DIAGNOSIS — G63 Polyneuropathy in diseases classified elsewhere: Secondary | ICD-10-CM

## 2017-01-09 MED ORDER — LAMOTRIGINE 100 MG PO TABS: ORAL_TABLET | ORAL | 3 refills | Status: DC

## 2017-01-09 NOTE — Progress Notes (Addendum)
Reason for visit: Memory disturbance  Tracy Nielsen is an 83 y.o. female  History of present illness:  Tracy Nielsen is an 83 year old right-handed white female with a history of seizures that have been under very good control, the patient is on Lamictal and is tolerating the medication well. The patient has a monoclonal antibody and an associated peripheral neuropathy. She does have a mild gait disturbance but she does not require a cane for ambulation. She reports some minor dysesthesias in the feet at night when she tries to go to sleep, but this does not impair her ability to get rest. The patient has a moderate level small vessel disease by MRI of the brain, she does report some problems with names for people primarily. She denies any other significant issues with short-term memory, difficulty operating a motor vehicle, or problems with keeping up with medications or appointments. The patient has not wanted to go on medications for memory, she returns to this office for an evaluation.  Past Medical History:  Diagnosis Date  . Glaucoma   . Hearing deficit    Hearing aids  . History of abnormal weight loss   . Low back pain   . Memory difficulty 05/09/2016  . MGUS (monoclonal gammopathy of unknown significance)   . Neuropathy associated with MGUS ([place]) 11/09/2015  . Radiculopathy of sacral and sacrococcygeal region    S1  . Seizures ([place])     Past Surgical History:  Procedure Laterality Date  . APPENDECTOMY    . BACK SURGERY    . CATARACT EXTRACTION Left   . OVARY SURGERY    . PARATHYROIDECTOMY  9/  . TRABECULECTOMY Left     Family History  Problem Relation Age of Onset  . Parkinsonism Father   . Colon cancer Sister   . Multiple sclerosis Daughter   . Muscular dystrophy Grandchild   . Muscular dystrophy Grandchild     Social history:  reports that she quit smoking about 43 years ago. She has never used smokeless tobacco. She reports that she drinks alcohol. She  reports that she does not use drugs.   No Known Allergies  Medications:  Prior to Admission medications   Medication Sig Start Date End Date Taking? Authorizing Provider  aspirin 81 MG tablet Take 81 mg by mouth daily.   Yes Historical Provider, MD  Cholecalciferol (VITAMIN D) 2000 UNITS CAPS Take 2,000 Units by mouth daily.   Yes Historical Provider, MD  dorzolamide-timolol (COSOPT) 22.3-6.8 MG/ML ophthalmic solution  01/18/14  Yes Historical Provider, MD  lamoTRIgine (LAMICTAL) 100 MG tablet Take 1 tablet in morning, 1 and 1/2 tablets at night 01/09/17  Yes [name], MD  losartan (COZAAR) 50 MG tablet Take 50 mg by mouth daily. 04/30/16  Yes Historical Provider, MD  LUMIGAN 0.01 % SOLN PLACE 1 DROP INTO THE RIGHT EYE NIGHTLY. 04/05/16   Historical Provider, MD  Polyethyl Glycol-Propyl Glycol (SYSTANE) 0.4-0.3 % SOLN Apply 1 drop to eye as needed (One drop left eye as needed).    Historical Provider, MD  timolol (BETIMOL) 0.25 % ophthalmic solution Place 1-2 drops into the right eye 2 (two) times daily.    Historical Provider, MD  Travoprost, BAK Free, (TRAVATAN) 0.004 % SOLN ophthalmic solution Place 1 drop into the right eye at bedtime.    Historical Provider, MD    ROS:  Out of a complete 14 system review of symptoms, the patient complains only of the following symptoms, and all other  reviewed systems are negative.  Memory disturbance  Blood pressure (!) 147/77, pulse 77, height 5\' 4"  (1.626 m), weight 127 lb 8 oz (57.8 kg).  Physical Exam  General: The patient is alert and cooperative at the time of the examination.  Skin: No significant peripheral edema is noted.   Neurologic Exam  Mental status: The patient is alert and oriented x 3 at the time of the examination. The patient has apparent normal recent and remote memory, with an apparently normal attention span and concentration ability. Mini-Mental Status Examination done today shows a total score of 27/30. The patient is  able to name 7 four legged animals in 1 minute.   Cranial nerves: Facial symmetry is present. Speech is normal, no aphasia or dysarthria is noted. Extraocular movements are full. Visual fields are full.  Motor: The patient has good strength in all 4 extremities.  Sensory examination: Soft touch sensation is symmetric on the face, arms, and legs.  Coordination: The patient has good finger-nose-finger and heel-to-shin bilaterally.  Gait and station: The patient has a normal gait. Tandem gait is unsteady. Romberg is negative. No drift is seen.  Reflexes: Deep tendon reflexes are symmetric, but are depressed.    MRI brain 11/19/15:  IMPRESSION:  This MRI of the brain without contrast shows the following: 1.   Chronic lacunar and microvascular ischemic changes in the white matter of both hemispheres. 2.   Mild chronic maxillary sinusitis 3.   There are no acute findings.  * MRI scan images were reviewed online. I agree with the written report.    Assessment/Plan:  1. Mild memory disturbance  2. Peripheral neuropathy, MGUS  3. Mild gait disturbance  4. History of seizures, well controlled  Overall, the patient is doing fairly well, she has been stable with the memory she believes. She still does not wish to go on any medications for memory, we will continue to follow. A prescription was given for her Lamictal. She will follow-up in one year.  Jill Alexanders MD 01/09/2017 10:03 AM  [facility] [address] [place], [place] [address]  Phone [phone] Fax [phone]

## 2018-01-14 ENCOUNTER — Encounter: Payer: Self-pay | Admitting: Neurology

## 2018-01-14 ENCOUNTER — Ambulatory Visit: Payer: Medicare Other | Admitting: Neurology

## 2018-01-14 ENCOUNTER — Telehealth: Payer: Self-pay | Admitting: Neurology

## 2018-01-14 VITALS — BP 143/79 | HR 79 | Ht 64.0 in | Wt 128.0 lb

## 2018-01-14 DIAGNOSIS — Z5181 Encounter for therapeutic drug level monitoring: Secondary | ICD-10-CM

## 2018-01-14 DIAGNOSIS — D472 Monoclonal gammopathy: Secondary | ICD-10-CM | POA: Diagnosis not present

## 2018-01-14 DIAGNOSIS — G63 Polyneuropathy in diseases classified elsewhere: Secondary | ICD-10-CM

## 2018-01-14 DIAGNOSIS — R413 Other amnesia: Secondary | ICD-10-CM | POA: Diagnosis not present

## 2018-01-14 DIAGNOSIS — R269 Unspecified abnormalities of gait and mobility: Secondary | ICD-10-CM

## 2018-01-14 DIAGNOSIS — G40309 Generalized idiopathic epilepsy and epileptic syndromes, not intractable, without status epilepticus: Secondary | ICD-10-CM | POA: Diagnosis not present

## 2018-01-14 MED ORDER — LAMOTRIGINE 100 MG PO TABS: ORAL_TABLET | ORAL | 3 refills | Status: DC

## 2018-01-14 NOTE — Telephone Encounter (Signed)
Patient states she does not feel the need to make a one year follow-up appointment due to her being "the same".

## 2018-01-14 NOTE — Progress Notes (Signed)
Reason for visit: Seizures  Tracy Nielsen is an 84 y.o. female  History of present illness:  Tracy Nielsen is an 84 year old right-handed white female with a history of well-controlled seizures on Lamictal.  The patient does have a peripheral neuropathy associated with a monoclonal antibody, she does have some mild gait instability, she does not use a cane and she has not had any falls.  The patient does report some mild memory issues that have been persistent for several years, the patient recently has decided to let her son take over the finances.  She lives at home with her husband, her husband is 24 years old and he is unable to do the finances himself.  The patient still operates a motor vehicle, she denies any problems with directions, she does have problems with remembering names for people and for streets.  She has not wanted to go on medications for memory in the past.  The patient overall is doing well.  Past Medical History:  Diagnosis Date  . Glaucoma   . Hearing deficit    Hearing aids  . History of abnormal weight loss   . Low back pain   . Memory difficulty 05/09/2016  . MGUS (monoclonal gammopathy of unknown significance)   . Neuropathy associated with MGUS ([place]) 11/09/2015  . Radiculopathy of sacral and sacrococcygeal region    S1  . Seizures ([place])     Past Surgical History:  Procedure Laterality Date  . APPENDECTOMY    . BACK SURGERY    . CATARACT EXTRACTION Left   . OVARY SURGERY    . PARATHYROIDECTOMY  9/  . TRABECULECTOMY Left     Family History  Problem Relation Age of Onset  . Parkinsonism Father   . Colon cancer Sister   . Multiple sclerosis Daughter   . Muscular dystrophy Grandchild   . Muscular dystrophy Grandchild     Social history:  reports that she quit smoking about 44 years ago. she has never used smokeless tobacco. She reports that she drinks alcohol. She reports that she does not use drugs.   No Known Allergies  Medications:  Prior  to Admission medications   Medication Sig Start Date End Date Taking? Authorizing Provider  dorzolamide-timolol (COSOPT) 22.3-6.8 MG/ML ophthalmic solution  01/18/14  Yes [provider]  lamoTRIgine (LAMICTAL) 100 MG tablet Take 1 tablet in morning, 1 and 1/2 tablets at night 01/14/18  Yes [name], MD  losartan (COZAAR) 50 MG tablet Take 50 mg by mouth daily. 04/30/16  Yes [provider]  LUMIGAN 0.01 % SOLN PLACE 1 DROP INTO THE RIGHT EYE NIGHTLY. 04/05/16  Yes [provider]  Polyethyl Glycol-Propyl Glycol (SYSTANE) 0.4-0.3 % SOLN Apply 1 drop to eye as needed (One drop left eye as needed).   Yes [provider]  timolol (BETIMOL) 0.25 % ophthalmic solution Place 1-2 drops into the right eye 2 (two) times daily.   Yes [provider]  Travoprost, BAK Free, (TRAVATAN) 0.004 % SOLN ophthalmic solution Place 1 drop into the right eye at bedtime.   Yes [provider]  aspirin 81 MG tablet Take 81 mg by mouth daily.    [provider]  Cholecalciferol (VITAMIN D) 2000 UNITS CAPS Take 2,000 Units by mouth daily.    [provider]    ROS:  Out of a complete 14 system review of symptoms, the patient complains only of the following symptoms, and all other reviewed systems are negative.  Hearing loss, runny nose Memory disturbance Itching  Blood pressure (!) 143/79, pulse 79, height 5\' 4"  (1.626 m), weight 128 lb (58.1 kg).  Physical Exam  General: The patient is alert and cooperative at the time of the examination.  Skin: No significant peripheral edema is noted.   Neurologic Exam  Mental status: The patient is alert and oriented x 3 at the time of the examination. The Mini-Mental status examination done today shows a total score of 27/30.   Cranial nerves: Facial symmetry is not present. Ptosis of the left eye is noted. Speech is normal, no aphasia or dysarthria is noted. Extraocular movements are full.  Visual fields are full.  Motor: The patient has good strength in all 4 extremities.  Sensory examination: Soft touch sensation is symmetric on the face, arms, and legs.  Coordination: The patient has good finger-nose-finger and heel-to-shin bilaterally.  Gait and station: The patient has a normal gait. Tandem gait is unsteady. Romberg is negative. No drift is seen.  Reflexes: Deep tendon reflexes are symmetric.   Assessment/Plan:  1.  History of seizures, well controlled  2.  Mild memory disturbance  3.  Peripheral neuropathy, gait disturbance  4.  Monoclonal antibody  The patient is overall doing fairly well.  She will continue the Lamictal, a prescription was sent in.  The patient will have blood work done today.  She will follow-up in 1 year.  The patient still does not wish to go on any medications for memory.  Jill Alexanders MD 01/14/2018 9:55 AM  [facility] [address] [place], [place] [address]  Phone [phone] Fax [phone]

## 2018-01-15 ENCOUNTER — Other Ambulatory Visit: Payer: Self-pay | Admitting: Neurology

## 2018-01-15 DIAGNOSIS — G40309 Generalized idiopathic epilepsy and epileptic syndromes, not intractable, without status epilepticus: Secondary | ICD-10-CM

## 2018-01-16 LAB — CBC WITH DIFFERENTIAL/PLATELET
BASOS: 1 %
Basophils Absolute: 0 10*3/uL (ref 0.0–0.2)
EOS (ABSOLUTE): 0.4 10*3/uL (ref 0.0–0.4)
EOS: 8 %
Hematocrit: 32.9 % — ABNORMAL LOW (ref 34.0–46.6)
Hemoglobin: 11.9 g/dL (ref 11.1–15.9)
IMMATURE GRANULOCYTES: 0 %
Immature Grans (Abs): 0 10*3/uL (ref 0.0–0.1)
LYMPHS: 29 %
Lymphocytes Absolute: 1.3 10*3/uL (ref 0.7–3.1)
MCH: 36.7 pg — ABNORMAL HIGH (ref 26.6–33.0)
MCHC: 36.2 g/dL — ABNORMAL HIGH (ref 31.5–35.7)
MCV: 102 fL — AB (ref 79–97)
Monocytes Absolute: 0.4 10*3/uL (ref 0.1–0.9)
Monocytes: 10 %
NEUTROS PCT: 52 %
Neutrophils Absolute: 2.3 10*3/uL (ref 1.4–7.0)
PLATELETS: 185 10*3/uL (ref 150–379)
RBC: 3.24 x10E6/uL — AB (ref 3.77–5.28)
RDW: 14.3 % (ref 12.3–15.4)
WBC: 4.4 10*3/uL (ref 3.4–10.8)

## 2018-01-16 LAB — COMPREHENSIVE METABOLIC PANEL
ALT: 14 IU/L (ref 0–32)
AST: 18 IU/L (ref 0–40)
Albumin/Globulin Ratio: 1.8 (ref 1.2–2.2)
Albumin: 4 g/dL (ref 3.5–4.7)
Alkaline Phosphatase: 73 IU/L (ref 39–117)
BUN/Creatinine Ratio: 20 (ref 12–28)
BUN: 19 mg/dL (ref 8–27)
Bilirubin Total: 0.2 mg/dL (ref 0.0–1.2)
CALCIUM: 8.6 mg/dL — AB (ref 8.7–10.3)
CO2: 25 mmol/L (ref 20–29)
CREATININE: 0.93 mg/dL (ref 0.57–1.00)
Chloride: 103 mmol/L (ref 96–106)
GFR calc Af Amer: 65 mL/min/{1.73_m2} (ref >59)
GFR, EST NON AFRICAN AMERICAN: 56 mL/min/{1.73_m2} — AB (ref >59)
Globulin, Total: 2.2 g/dL (ref 1.5–4.5)
Glucose: 95 mg/dL (ref 65–99)
POTASSIUM: 4.6 mmol/L (ref 3.5–5.2)
Sodium: 141 mmol/L (ref 134–144)
Total Protein: 6.2 g/dL (ref 6.0–8.5)

## 2018-01-16 LAB — LAMOTRIGINE LEVEL: Lamotrigine Lvl: 4.4 ug/mL (ref 2.0–20.0)

## 2018-03-18 ENCOUNTER — Ambulatory Visit: Payer: Medicare Other | Admitting: Podiatry

## 2018-03-18 ENCOUNTER — Encounter: Payer: Self-pay | Admitting: Podiatry

## 2018-03-18 DIAGNOSIS — B351 Tinea unguium: Secondary | ICD-10-CM

## 2018-03-18 DIAGNOSIS — M79676 Pain in unspecified toe(s): Secondary | ICD-10-CM | POA: Diagnosis not present

## 2018-03-18 NOTE — Progress Notes (Signed)
She presents today with a chief complaint of painful elongated toenails recommended to come see [place] by her primary care provider.  Objective: Vital signs are stable she is alert and oriented x3 pulses remain palpable no open lesions or wounds.  Neurologic sensorium is intact.  Deep tendon reflexes are intact toenails are long thick yellow dystrophic-like mycotic and painful on palpation.  Assessment: Pain in limb secondary to onychomycosis.  Plan: Treatment of onychomycosis bilateral.

## 2018-05-08 ENCOUNTER — Other Ambulatory Visit: Payer: Self-pay | Admitting: Family Medicine

## 2018-05-08 ENCOUNTER — Ambulatory Visit
Admission: RE | Admit: 2018-05-08 | Discharge: 2018-05-08 | Disposition: A | Discharge status: Home / Self Care | Payer: Medicare Other | Source: Ambulatory Visit | Attending: Family Medicine | Admitting: Family Medicine

## 2018-05-08 DIAGNOSIS — Y92009 Unspecified place in unspecified non-institutional (private) residence as the place of occurrence of the external cause: Principal | ICD-10-CM

## 2018-05-08 DIAGNOSIS — W19XXXA Unspecified fall, initial encounter: Secondary | ICD-10-CM

## 2018-05-13 DIAGNOSIS — S52502A Unspecified fracture of the lower end of left radius, initial encounter for closed fracture: Secondary | ICD-10-CM | POA: Insufficient documentation

## 2018-07-31 ENCOUNTER — Other Ambulatory Visit: Payer: Self-pay | Admitting: Family Medicine

## 2018-07-31 DIAGNOSIS — R413 Other amnesia: Secondary | ICD-10-CM

## 2018-07-31 LAB — BASIC METABOLIC PANEL
BUN: 16 (ref 4–21)
Creatinine: 0.9 (ref <=1.1)
Glucose: 116
Potassium: 3.8 (ref 3.4–5.3)
Sodium: 137 (ref 137–147)

## 2018-07-31 LAB — VITAMIN B12: Vitamin B-12: 967

## 2018-08-07 ENCOUNTER — Ambulatory Visit
Admission: RE | Admit: 2018-08-07 | Discharge: 2018-08-07 | Disposition: A | Discharge status: Home / Self Care | Payer: Medicare Other | Source: Ambulatory Visit | Attending: Family Medicine | Admitting: Family Medicine

## 2018-08-07 DIAGNOSIS — R413 Other amnesia: Secondary | ICD-10-CM

## 2018-08-08 ENCOUNTER — Ambulatory Visit
Admission: RE | Admit: 2018-08-08 | Discharge: 2018-08-08 | Disposition: A | Discharge status: Home / Self Care | Payer: Medicare Other | Source: Ambulatory Visit | Attending: Family Medicine | Admitting: Family Medicine

## 2018-08-08 DIAGNOSIS — R413 Other amnesia: Secondary | ICD-10-CM

## 2018-08-08 MED ORDER — GADOBENATE DIMEGLUMINE 529 MG/ML IV SOLN
12.0000 mL | Freq: Once | INTRAVENOUS | Status: AC | PRN
  Administered 2018-08-08: 12 mL via INTRAVENOUS

## 2018-08-11 ENCOUNTER — Other Ambulatory Visit: Payer: Self-pay | Admitting: Family Medicine

## 2018-08-11 DIAGNOSIS — E042 Nontoxic multinodular goiter: Secondary | ICD-10-CM | POA: Insufficient documentation

## 2018-08-11 DIAGNOSIS — E041 Nontoxic single thyroid nodule: Secondary | ICD-10-CM

## 2018-08-18 ENCOUNTER — Ambulatory Visit
Admission: RE | Admit: 2018-08-18 | Discharge: 2018-08-18 | Disposition: A | Discharge status: Home / Self Care | Payer: Medicare Other | Source: Ambulatory Visit | Attending: Family Medicine | Admitting: Family Medicine

## 2018-08-18 DIAGNOSIS — E041 Nontoxic single thyroid nodule: Secondary | ICD-10-CM

## 2018-09-03 LAB — CBC AND DIFFERENTIAL
HCT: 35 — AB (ref 36–46)
Hemoglobin: 12 (ref 12.0–16.0)
Platelets: 154 (ref 150–399)
WBC: 4.7

## 2018-09-03 LAB — TSH: TSH: 1.31 (ref 0.41–5.90)

## 2018-11-03 ENCOUNTER — Other Ambulatory Visit: Payer: Self-pay | Admitting: Neurology

## 2018-11-03 DIAGNOSIS — G40309 Generalized idiopathic epilepsy and epileptic syndromes, not intractable, without status epilepticus: Secondary | ICD-10-CM

## 2018-11-10 ENCOUNTER — Other Ambulatory Visit: Payer: Self-pay | Admitting: *Deleted

## 2018-11-10 DIAGNOSIS — G40309 Generalized idiopathic epilepsy and epileptic syndromes, not intractable, without status epilepticus: Secondary | ICD-10-CM

## 2018-11-10 MED ORDER — LAMOTRIGINE 100 MG PO TABS: ORAL_TABLET | ORAL | 0 refills | Status: DC

## 2019-04-20 ENCOUNTER — Other Ambulatory Visit: Payer: Self-pay

## 2019-04-20 ENCOUNTER — Other Ambulatory Visit: Payer: Self-pay | Admitting: Neurology

## 2019-04-20 DIAGNOSIS — G40309 Generalized idiopathic epilepsy and epileptic syndromes, not intractable, without status epilepticus: Secondary | ICD-10-CM

## 2019-04-28 ENCOUNTER — Other Ambulatory Visit: Payer: Self-pay | Admitting: Neurology

## 2019-04-28 ENCOUNTER — Telehealth: Payer: Self-pay | Admitting: Neurology

## 2019-04-28 DIAGNOSIS — G40309 Generalized idiopathic epilepsy and epileptic syndromes, not intractable, without status epilepticus: Secondary | ICD-10-CM

## 2019-04-28 MED ORDER — LAMOTRIGINE 100 MG PO TABS: ORAL_TABLET | ORAL | 0 refills | Status: DC

## 2019-04-28 NOTE — Telephone Encounter (Signed)
Pt was scheduled an appt in order to get a refill on her lamoTRIgine (LAMICTAL) 100 MG tablet. Pts son in law has consented to a Virtual Visit.   Pt understands that although there may be some limitations with this type of visit, we will take all precautions to reduce any security or privacy concerns.  Pt understands that this will be treated like an in office visit and we will file with pt's insurance, and there may be a patient responsible charge related to this service.  Pt was scheduled with Butler Denmark, NP  Link sent to [phone] [employer]

## 2019-04-28 NOTE — Telephone Encounter (Signed)
Noted. Rx has been submitted for Lamictal 100 mg due to pending appt scheduled with NP.

## 2019-05-05 ENCOUNTER — Ambulatory Visit (INDEPENDENT_AMBULATORY_CARE_PROVIDER_SITE_OTHER): Payer: Medicare Other | Admitting: Neurology

## 2019-05-05 ENCOUNTER — Other Ambulatory Visit: Payer: Self-pay

## 2019-05-05 ENCOUNTER — Encounter: Payer: Self-pay | Admitting: Neurology

## 2019-05-05 DIAGNOSIS — R413 Other amnesia: Secondary | ICD-10-CM | POA: Diagnosis not present

## 2019-05-05 DIAGNOSIS — R569 Unspecified convulsions: Secondary | ICD-10-CM | POA: Diagnosis not present

## 2019-05-05 NOTE — Progress Notes (Signed)
I have read the note, and I agree with the clinical assessment and plan.  Sherlon Nied K Quantarius Genrich   

## 2019-05-05 NOTE — Progress Notes (Signed)
Virtual Visit via Video Note  I connected with Tracy Nielsen on 05/05/19 at  3:15 PM EDT by a video enabled telemedicine application and verified that I am speaking with the correct person using two identifiers.  Location: Patient: At her home  Provider: In the office    I discussed the limitations of evaluation and management by telemedicine and the availability of in person appointments. The patient expressed understanding and agreed to proceed.  History of Present Illness: 05/05/2019 SS: Tracy Nielsen is an 85 year old female with history of well-controlled seizures on Lamictal.  She is currently taking Lamictal 100 mg tablet, 1 in the morning, 1.5 in the evening. Her last MMSE was 27/30.  She indicates she is doing well, she has not had any recurrent seizures.  She reports that her memory is fair, not much change since her last visit.  She currently lives with her son.  Her son-in-law, Tracy Nielsen is helping her with this virtual visit.  She manages her own ADLs, she does some cooking, some housework.  She is able to drive a car.  He has not had any trouble getting lost or remembering directions.  Her family feels that she is safe to drive a car at this time.  Her family will fill her pillbox with her medications.  She reports that she has a good appetite.  She says that her walking is fair, she has not had any falls, she does not require a cane or walker.  Today she denies any new problems or concerns.  She reports she does need a refill of her Lamictal.  Since her last office visit, her primary care doctor has started her on Aricept 10 mg at Bedtime.  01/14/2018 Dr. Jannifer Franklin: Tracy Nielsen is an 85 year old right-handed white female with a history of well-controlled seizures on Lamictal.  The patient does have a peripheral neuropathy associated with a monoclonal antibody, she does have some mild gait instability, she does not use a cane and she has not had any falls.  The patient does report some mild  memory issues that have been persistent for several years, the patient recently has decided to let her son take over the finances.  She lives at home with her husband, her husband is 30 years old and he is unable to do the finances himself.  The patient still operates a motor vehicle, she denies any problems with directions, she does have problems with remembering names for people and for streets.  She has not wanted to go on medications for memory in the past.  The patient overall is doing well.   Observations/Objective: Alert, answers questions appropriately, speech is clear and concise, follows commands, facial symmetry noted, Moca-blind is 17/22, no problems with gait  Assessment and Plan: 1.  Seizure disorder, well controlled 2.  Memory disorder, mild  Her seizures are currently under good control, she has not had recurrent seizure.  She will continue taking Lamictal 100 mg tablets, 1 tablet in the morning, 1.5 tablets in the evening.  I will order lab work, she will come to the office in the next 1 to 2 weeks to have this done.  Once the lab work results, I will send in a new prescription.  Her memory score is stable, compared to her last MMSE which was 27/30 in February 2019.  She will continue taking Aricept 10 mg, which is prescribed by her primary care doctor.  She will follow-up in 1 year or sooner if needed.  She is currently going to be due for her physical with her primary care doctor, she has asked when the labs result to run a copy and send them in the mail to her so her primary doctor, this is reasonable and can be done.  We can also print the AVS from today with the MyChart sign up information.   Follow Up Instructions: 1 year    I discussed the assessment and treatment plan with the patient. The patient was provided an opportunity to ask questions and all were answered. The patient agreed with the plan and demonstrated an understanding of the instructions.   The patient was  advised to call back or seek an in-person evaluation if the symptoms worsen or if the condition fails to improve as anticipated.  I provided 20 minutes of non-face-to-face time during this encounter.   Evangeline Dakin, DNP  [facility] [address] [place], [place] [address] [phone]

## 2019-05-27 ENCOUNTER — Other Ambulatory Visit: Payer: Medicare Other

## 2019-06-25 ENCOUNTER — Other Ambulatory Visit: Payer: Self-pay

## 2019-06-25 ENCOUNTER — Other Ambulatory Visit (INDEPENDENT_AMBULATORY_CARE_PROVIDER_SITE_OTHER): Payer: Self-pay

## 2019-06-25 DIAGNOSIS — Z0289 Encounter for other administrative examinations: Secondary | ICD-10-CM

## 2019-06-25 DIAGNOSIS — R569 Unspecified convulsions: Secondary | ICD-10-CM

## 2019-06-29 ENCOUNTER — Telehealth: Payer: Self-pay | Admitting: Neurology

## 2019-06-29 DIAGNOSIS — G40309 Generalized idiopathic epilepsy and epileptic syndromes, not intractable, without status epilepticus: Secondary | ICD-10-CM

## 2019-06-29 LAB — COMPREHENSIVE METABOLIC PANEL
ALT: 14 IU/L (ref 0–32)
AST: 23 IU/L (ref 0–40)
Albumin/Globulin Ratio: 2.4 — ABNORMAL HIGH (ref 1.2–2.2)
Albumin: 4.4 g/dL (ref 3.6–4.6)
Alkaline Phosphatase: 69 IU/L (ref 39–117)
BUN/Creatinine Ratio: 15 (ref 12–28)
BUN: 17 mg/dL (ref 8–27)
Bilirubin Total: 0.2 mg/dL (ref 0.0–1.2)
CO2: 21 mmol/L (ref 20–29)
Calcium: 9.1 mg/dL (ref 8.7–10.3)
Chloride: 103 mmol/L (ref 96–106)
Creatinine, Ser: 1.12 mg/dL — ABNORMAL HIGH (ref 0.57–1.00)
GFR calc Af Amer: 51 mL/min/{1.73_m2} — ABNORMAL LOW (ref >59)
GFR calc non Af Amer: 44 mL/min/{1.73_m2} — ABNORMAL LOW (ref >59)
Globulin, Total: 1.8 g/dL (ref 1.5–4.5)
Glucose: 92 mg/dL (ref 65–99)
Potassium: 4.3 mmol/L (ref 3.5–5.2)
Sodium: 138 mmol/L (ref 134–144)
Total Protein: 6.2 g/dL (ref 6.0–8.5)

## 2019-06-29 LAB — CBC WITH DIFFERENTIAL/PLATELET
Basophils Absolute: 0 10*3/uL (ref 0.0–0.2)
Basos: 1 %
EOS (ABSOLUTE): 0.2 10*3/uL (ref 0.0–0.4)
Eos: 5 %
Hematocrit: 35.7 % (ref 34.0–46.6)
Hemoglobin: 12.2 g/dL (ref 11.1–15.9)
Immature Grans (Abs): 0 10*3/uL (ref 0.0–0.1)
Immature Granulocytes: 0 %
Lymphocytes Absolute: 1.1 10*3/uL (ref 0.7–3.1)
Lymphs: 24 %
MCH: 33.5 pg — ABNORMAL HIGH (ref 26.6–33.0)
MCHC: 34.2 g/dL (ref 31.5–35.7)
MCV: 98 fL — ABNORMAL HIGH (ref 79–97)
Monocytes Absolute: 0.4 10*3/uL (ref 0.1–0.9)
Monocytes: 9 %
Neutrophils Absolute: 2.9 10*3/uL (ref 1.4–7.0)
Neutrophils: 61 %
Platelets: 168 10*3/uL (ref 150–450)
RBC: 3.64 x10E6/uL — ABNORMAL LOW (ref 3.77–5.28)
RDW: 13.1 % (ref 11.7–15.4)
WBC: 4.7 10*3/uL (ref 3.4–10.8)

## 2019-06-29 LAB — LAMOTRIGINE LEVEL: Lamotrigine Lvl: 6.8 ug/mL (ref 2.0–20.0)

## 2019-06-29 MED ORDER — LAMOTRIGINE 100 MG PO TABS: ORAL_TABLET | ORAL | 1 refills | Status: DC

## 2019-06-29 NOTE — Telephone Encounter (Signed)
I called the patient.  Lab work was unremarkable, with the exception of a mildly elevated creatinine 1.12 level. The Lamictal level was in good range at 6.8.  I will send a refill of medication to her pharmacy.  She reports she does not need a copy of labs sent to her primary.

## 2019-09-02 LAB — NOVEL CORONAVIRUS, NAA: SARS-CoV-2, NAA: NOT DETECTED

## 2019-09-07 ENCOUNTER — Encounter: Payer: Self-pay | Admitting: Internal Medicine

## 2019-09-09 ENCOUNTER — Encounter: Payer: Self-pay | Admitting: Internal Medicine

## 2019-09-09 ENCOUNTER — Other Ambulatory Visit: Payer: Self-pay | Admitting: *Deleted

## 2019-09-09 ENCOUNTER — Non-Acute Institutional Stay: Payer: Medicare Other | Admitting: Internal Medicine

## 2019-09-09 ENCOUNTER — Other Ambulatory Visit: Payer: Self-pay

## 2019-09-09 VITALS — BP 144/69 | HR 65 | Temp 97.2°F | Resp 18 | Ht 64.0 in | Wt 120.1 lb

## 2019-09-09 DIAGNOSIS — J449 Chronic obstructive pulmonary disease, unspecified: Secondary | ICD-10-CM

## 2019-09-09 DIAGNOSIS — G309 Alzheimer's disease, unspecified: Secondary | ICD-10-CM

## 2019-09-09 DIAGNOSIS — H04123 Dry eye syndrome of bilateral lacrimal glands: Secondary | ICD-10-CM | POA: Diagnosis not present

## 2019-09-09 DIAGNOSIS — F0150 Vascular dementia without behavioral disturbance: Secondary | ICD-10-CM

## 2019-09-09 DIAGNOSIS — Z7189 Other specified counseling: Secondary | ICD-10-CM

## 2019-09-09 DIAGNOSIS — E042 Nontoxic multinodular goiter: Secondary | ICD-10-CM | POA: Diagnosis not present

## 2019-09-09 DIAGNOSIS — M810 Age-related osteoporosis without current pathological fracture: Secondary | ICD-10-CM | POA: Diagnosis not present

## 2019-09-09 DIAGNOSIS — D472 Monoclonal gammopathy: Secondary | ICD-10-CM

## 2019-09-09 DIAGNOSIS — G40309 Generalized idiopathic epilepsy and epileptic syndromes, not intractable, without status epilepticus: Secondary | ICD-10-CM

## 2019-09-09 DIAGNOSIS — F0280 Dementia in other diseases classified elsewhere without behavioral disturbance: Secondary | ICD-10-CM

## 2019-09-09 DIAGNOSIS — M8949 Other hypertrophic osteoarthropathy, multiple sites: Secondary | ICD-10-CM

## 2019-09-09 DIAGNOSIS — H409 Unspecified glaucoma: Secondary | ICD-10-CM

## 2019-09-09 DIAGNOSIS — G63 Polyneuropathy in diseases classified elsewhere: Secondary | ICD-10-CM

## 2019-09-09 DIAGNOSIS — M159 Polyosteoarthritis, unspecified: Secondary | ICD-10-CM

## 2019-09-09 NOTE — Progress Notes (Signed)
Provider:  Rexene Nielsen. Tracy Nielsen, D.O., C.M.D. Location:  Occupational psychologist of Service:  Clinic (12)  Previous PCP: Tracy Curry, DO Patient Care Team: Tracy Curry, DO as PCP - General (Geriatric Medicine) Tracy Ducking, MD as Consulting Physician (Nielsen)  Extended Emergency Contact Information Primary Emergency Contact: [name] Phone: [phone] Mobile Phone: [phone] Relation: Relative  Code Status: DNR reviewed with her today--this is what she wants and Tracy Nielsen, her son-in-law and HCPOA is in agreement with whatever her wishes are--she understands that this means no CPR if she has a cardiac arrest  Goals of Care: Advanced Directive information Advanced Directives 09/09/2019  Does Patient Have a Medical Advance Directive? Yes  Type of Advance Directive Tracy Nielsen  Does patient want to make changes to medical advance directive? No - Patient declined  Copy of Tracy Nielsen in Chart? Yes - validated most recent copy scanned in chart (See row information)   Chief Complaint  Patient presents with  . New Patient (Initial Visit)    transfer care    HPI: Patient is a 85 y.o. female seen today to establish with Tracy Nielsen.  Record request just sent to Dr. Kelton Nielsen at [facility] today by our office.  Pt's son-in-law, Tracy Nielsen, requested to be part of her visit today.  He's her HCPOA and has been helping out recently to keep track of her care.    Tracy Nielsen has moved to [name] Sept 1st.  She had been living in her home with her husband, Tracy Nielsen, for whom she had been caregiving until he passed away.  She's been back in [place] for 32 years.  She lived in [place] and [place] at other points in life.  Her spirits have been good despite the COVID pandemic limiting her ability to socialize as much here in her [place].  She is not yet in her official room--staying in the respite room while hers was painted and now  things are delayed due to a covid case.    Tracy Nielsen was having some increased memory loss over the past couple of years and her family was concerned about her being alone in her home with COVID here recently so they decided ALwould be better for her.  She admits that remembering names is challenging.  She's had no difficulty with paranoia, hallucinations, delusions, depression, anxiety or falls. She is on aricept 10mg .  HR is 65.  MMSE was 27/30 at Tracy Nielsen in 2019.  MoCA-blind was 17/22 per visit in 6/20.  Memory problems noted in 2017 based on problem list.    She has lost weight.  She was never overweight and upon admission was down 10-15 lbs from baseline per Tracy Nielsen.  She always walked a lot and he reports she was the first on the block with a new [employer] back in the day.  She says she has a good appetite.    She sleeps well.     She has some difficulty being unsteady due to her left foot having a slight foot drop and some weakness.  She also has had some arthritis pains in her right hip area--bother her when she lifts things or stands up from leaning over when making the bed, for example.  Notes from neuro indicate she has peripheral neuropathy from Tracy Nielsen.     Tracy Nielsen had a terrible seizure many many years ago and has been on medication since--she takes lamictal.  Has never had another seizure.  She does have glaucoma that affects her right eye--on drops.  She follows with Tracy Nielsen Nielsen.  She has curving/curling toenails that get thick and need trimming so a podiatry referral was requested.    She sees Dr. Mariea Nielsen as her dentist.  She's required extra visits due to a need for special attention to her gums per Tracy Nielsen.    She's had hypertension managed with olmesartan and she says it's been controlled w/o need for changes.    As I reviewed her records in epic, I discovered she's had a distal radial fracture indicative of osteoporosis.  She's previously had a parathyroidectomy  also.  I will review her history of bone density testing when I get Dr. Delene Ruffini notes.  Past Medical History:  Diagnosis Date  . Arthritis    Per Tracy Nielsen new patient packet   . Epilepsy ([place])    Per Tracy Nielsen new patient packet   . Glaucoma   . Hearing deficit    Hearing aids  . High blood pressure    Per Tracy Nielsen new patient packet   . History of abnormal weight loss   . Low back pain   . Memory difficulty 05/09/2016  . Tracy Nielsen (monoclonal gammopathy of unknown significance)   . Neuropathy associated with Tracy Nielsen (Tracy Nielsen) 11/09/2015  . Radiculopathy of sacral and sacrococcygeal region    S1  . Seizures ([place])    Past Surgical History:  Procedure Laterality Date  . APPENDECTOMY    . BACK SURGERY    . CATARACT EXTRACTION Left   . OVARY SURGERY    . PARATHYROIDECTOMY  9/  . TRABECULECTOMY Left     Social History   Socioeconomic History  . Marital status: Married    Spouse name: Not on file  . Number of children: 4  . Years of education: 97  . Highest education level: Not on file  Occupational History  . Occupation: Retired  Scientific laboratory technician  . Financial resource strain: Not on file  . Food insecurity    Worry: Not on file    Inability: Not on file  . Transportation needs    Medical: Not on file    Non-medical: Not on file  Tobacco Use  . Smoking status: Former Smoker    Packs/day: 0.50    Years: 25.00    Pack years: 12.50    Types: Cigarettes    Quit date: 12/03/1973    Years since quitting: 45.8  . Smokeless tobacco: Never Used  Substance and Sexual Activity  . Alcohol use: Yes    Comment: Consumes 2 glasses of wine per day  . Drug use: No  . Sexual activity: Not Currently  Lifestyle  . Physical activity    Days per week: Not on file    Minutes per session: Not on file  . Stress: Not on file  Relationships  . Social Herbalist on phone: Not on file    Gets together: Not on file    Attends religious service: Not on file    Active member of club or organization: Not  on file    Attends meetings of clubs or organizations: Not on file    Relationship status: Not on file  Other Topics Concern  . Not on file  Social History Narrative   Lives at home w/ her husband.   Patient drinks about 2 cups of caffeine daily.   Patient is right handed.       Per [facility] Patient  Packet, Abstracted 08/26/2019   Diet:Normal      Caffeine:Yes      Married, if yes what year: Widow 23 (div 59), remarried 1988      Do you live in a house, apartment, assisted living, condo, trailer, ect: Assisted Living ([facility]), 1 stories, 1 person      Pets: No      Current/Past profession: Academic librarian, organize affairs for elderly      Exercise: Yes, walking daily       Living Will: Yes   DNR: No, would like to discuss one--did discuss on 10/9 and DNR form completed, copy made for vynca, and original placed in paper chart; HCPOA, Tracy Nielsen, also present and agreeable with his mother-in-law decision      POA/HPOA: Yes      Functional Status:   Do you have difficulty bathing or dressing yourself? no   Do you have difficulty preparing food or eating? Prep yes, eating no   Do you have difficulty managing your medications? yes   Do you have difficulty managing your finances? SIL helps   Do you have difficulty affording your medications?  no    reports that she quit smoking about 45 years ago. Her smoking use included cigarettes. She has a 12.50 pack-year smoking history. She has never used smokeless tobacco. She reports current alcohol use. She reports that she does not use drugs.  Functional Status Survey:    Family History  Problem Relation Age of Onset  . Congestive Heart Failure Mother   . Parkinsonism Father   . Heart attack Father   . Colon cancer Sister   . Multiple sclerosis Daughter   . Muscular dystrophy Grandchild   . Muscular dystrophy Grandchild     Health Maintenance  Topic Date Due  . TETANUS/TDAP   02/04/1949  . DEXA SCAN  02/03/1997  . PNA vac Low Risk Adult (1 of 2 - PCV13) 02/03/1997  . INFLUENZA VACCINE  07/04/2019    Allergies  Allergen Reactions  . Hctz [Hydrochlorothiazide]   . Other Other (See Comments)    Caused sodium to drop    Outpatient Encounter Medications as of 09/09/2019  Medication Sig  . brimonidine (ALPHAGAN) 0.2 % ophthalmic solution Place 1 drop into the right eye 3 (three) times daily. Wait 10 minutes between drops. (Keep eyes closed for 2 minutes after each drop)  . Cholecalciferol (VITAMIN D) 2000 UNITS CAPS Take 2,000 Units by mouth daily.  Marland Kitchen donepezil (ARICEPT) 10 MG tablet Take 10 mg by mouth at bedtime.  . dorzolamide-timolol (COSOPT) 22.3-6.8 MG/ML ophthalmic solution Place 1 drop into the right eye 2 (two) times daily. Wait 10 minutes between drops. (Keep eyes closed for 2 minutes after each drop)  . lamoTRIgine (LAMICTAL) 100 MG tablet Take 1 tablet in morning, 1 and 1/2 tablets at night  . LUMIGAN 0.01 % SOLN Place 1 drop into the right eye at bedtime. Wait 10 minutes between drops. (Keep eyes closed for 2 minutes after each drop)  . Multiple Vitamin (MULTIVITAMIN) tablet Take 1 tablet by mouth daily.  Marland Kitchen olmesartan (BENICAR) 20 MG tablet Take 20 mg by mouth daily.  . pilocarpine (PILOCAR) 4 % ophthalmic solution Place 1 drop into the right eye 4 (four) times daily. Wait 10 minutes between drops. (Keep eyes closed for 2 minutes after each drop)  . Polyethyl Glycol-Propyl Glycol (SYSTANE) 0.4-0.3 % SOLN Place 1 drop into the left eye as needed. Wait 10 minutes between drops. (Keep  eyes closed for 2 minutes after each drop)  . [DISCONTINUED] aspirin 81 MG tablet Take 81 mg by mouth daily.  . [DISCONTINUED] naproxen sodium (ALEVE) 220 MG tablet Take 220 mg by mouth as needed.  . [DISCONTINUED] timolol (BETIMOL) 0.25 % ophthalmic solution Place 1-2 drops into the right eye 2 (two) times daily.  . [DISCONTINUED] Travoprost, BAK Free, (TRAVATAN) 0.004 % SOLN  ophthalmic solution Place 1 drop into the right eye at bedtime.   No facility-administered encounter medications on file as of 09/09/2019.     Review of Systems  Constitutional: Positive for weight loss. Negative for chills, fever and malaise/fatigue.  HENT: Negative for congestion, hearing loss and sore throat.   Eyes: Negative for pain.       Glaucoma right eye  Respiratory: Negative for cough, shortness of breath and wheezing.   Cardiovascular: Negative for chest pain, palpitations and leg swelling.       Hypertension  Gastrointestinal: Negative for abdominal pain, blood in stool, constipation and melena.  Genitourinary: Negative for dysuria, frequency and urgency.  Musculoskeletal: Positive for joint pain. Negative for falls and myalgias.  Skin: Negative for itching and rash.  Neurological: Negative for dizziness, tingling, sensory change and loss of consciousness.  Psychiatric/Behavioral: Positive for memory loss. Negative for depression and hallucinations. The patient is not nervous/anxious and does not have insomnia.     Vitals:   09/09/19 1354  BP: (!) 144/69  Pulse: 65  Resp: 18  Temp: (!) 97.2 F (36.2 C)  SpO2: 96%  Weight: 120 lb 1.6 oz (54.5 kg)  Height: 5\' 4"  (1.626 m)   Body mass index is 20.62 kg/m. Physical Exam Vitals signs and nursing note reviewed.  Constitutional:      General: She is not in acute distress.    Appearance: Normal appearance. She is not toxic-appearing.     Comments: Thin female resting on the sofa in her AL apt  HENT:     Head: Normocephalic and atraumatic.     Right Ear: External ear normal.     Left Ear: External ear normal.     Nose: Nose normal.     Mouth/Throat:     Pharynx: Oropharynx is clear. No oropharyngeal exudate or posterior oropharyngeal erythema.  Eyes:     General:        Right eye: No discharge.        Left eye: No discharge.     Extraocular Movements: Extraocular movements intact.     Conjunctiva/sclera:  Conjunctivae normal.     Pupils: Pupils are equal, round, and reactive to light.  Cardiovascular:     Rate and Rhythm: Normal rate and regular rhythm.     Pulses: Normal pulses.     Heart sounds: Normal heart sounds. No murmur. No friction rub. No gallop.   Pulmonary:     Effort: Pulmonary effort is normal. No respiratory distress.     Breath sounds: No wheezing or rales.  Abdominal:     General: Abdomen is flat. Bowel sounds are normal. There is no distension.     Palpations: Abdomen is soft.     Tenderness: There is no abdominal tenderness. There is no guarding or rebound.     Hernia: No hernia is present.  Neurological:     General: No focal deficit present.     Mental Status: She is alert and oriented to person, place, and time.     Cranial Nerves: No cranial nerve deficit.     Sensory:  No sensory deficit.     Motor: No weakness.     Coordination: Coordination normal.     Gait: Gait normal.     Comments: But some mild short-term memory loss noted (repetition of some comments and needed Tracy Nielsen to clarify a few things); 4+/5 strength left LE--proximally and some decreased coordination of that foot during exam, sensation intact; deformity of toes with prominently curled and distorted nail on right 3rd toe  Psychiatric:        Mood and Affect: Mood normal.        Thought Content: Thought content normal.     Labs reviewed: Basic Metabolic Panel: Recent Labs    06/25/19 1553  NA 138  K 4.3  CL 103  CO2 21  GLUCOSE 92  BUN 17  CREATININE 1.12*  CALCIUM 9.1   Liver Function Tests: Recent Labs    06/25/19 1553  AST 23  ALT 14  ALKPHOS 69  BILITOT 0.2  PROT 6.2  ALBUMIN 4.4   No results for input(s): LIPASE, AMYLASE in the last 8760 hours. No results for input(s): AMMONIA in the last 8760 hours. CBC: Recent Labs    06/25/19 1553  WBC 4.7  NEUTROABS 2.9  HGB 12.2  HCT 35.7  MCV 98*  PLT 168   Cardiac Enzymes: No results for input(s): CKTOTAL, CKMB,  CKMBINDEX, TROPONINI in the last 8760 hours. BNP: Invalid input(s): POCBNP No results found for: HGBA1C Lab Results  Component Value Date   TSH 1.31 09/03/2018   Lab Results  Component Value Date   [id number] 07/31/2018   Lab Results  Component Value Date   FOLATE > 20.0 ng/mL 10/13/2007   Lab Results  Component Value Date   IRON 172 (H) 10/13/2007   FERRITIN 31.5 10/13/2007    Imaging and Procedures noted on new patient packet: None included  MRI brain 08/08/18 in at [facility] shows: No acute infarct, hemorrhage, or mass lesion is present. Moderate confluent periventricular T2 hyperintensities are present bilaterally. Scattered subcortical white matter changes are noted as well. Dilated perivascular spaces are present in the basal ganglia. White matter changes extend into the brainstem. There is some patient motion. There is moderate hippocampal atrophy bilaterally. The ventricles are proportionate to the degree of atrophy.  Thyroid US also from 9/19 indicates repeat needed in 5 years as three of the nodules should have surveillance.    Assessment/Plan 1. Multiple thyroid nodules -noted and had [place] last year--needs f/u in 5 years (pt currently 87)  2. Generalized convulsive epilepsy without intractable epilepsy ([place]) -cont lamictal, monitor CBC, liver panel regularly  3. Senile osteoporosis -not on medication, need records about bone densities at [facility] -is on Vitamin D 2000 units daily -had prior hyperpara s/p surgery and not on calcium likely b/c of this  4. Dry eye syndrome of bilateral lacrimal glands -cont lumigan drops routinely with systane for breakthrough  5. Chronic obstructive pulmonary disease, unspecified COPD type ([place]) -per record, no symptoms reported and no meds  6. Neuropathy associated with Tracy Nielsen ([place]) -per neuro notes; need Dr. Delene Ruffini records about Tracy Nielsen finding--will need to monitor to ensure no progression to myeloma -? Seems left  leg weakness and foot issue is due to nerve root issue not neuropathy   7. Primary osteoarthritis involving multiple joints -reports hip and lower back and left foot  -had used prn aleve at home -would use tylenol if needed here  8. Glaucoma of right eye, unspecified glaucoma type -cont pilocar, cosopt,  brimonidine per [facility] ophtho  9. Mixed Alzheimer's and vascular dementia ([place]) -mild at this point--had been requiring assistance with meds, appts, food so move to AL was certainly appropriate -cont aricept 10mg  -note I suspect mixed dementia due to white matter changes on MRI, presence of hypertensive retinopathy suggestive she'd have similar effects on the brain and finally, the AD aspect due to the hippocampal atrophy noted   Advance Care Planning:  Reviewed with pt and her son-in-law/HCPOA--Betty requests DNR status so goldenrod form completed with DOB added to it  -will discuss MOST in the future; spent 5-10 mins only on this as patient was already decided  Labs/tests ordered:  Cbc, cmp, flp, tsh next draw F/u in 4 mos in [facility]  Records to be abstracted when received from Dr. Delene Ruffini office  Ross. Keita Demarco, D.O. [facility] [address] [place],  [address] Cell Phone (Mon-Fri 8am-5pm):  [phone] On Call:  [phone] & follow prompts after 5pm & weekends Office Phone:  [phone] Office Fax:  [phone]

## 2019-09-10 ENCOUNTER — Other Ambulatory Visit: Payer: Self-pay | Admitting: *Deleted

## 2019-09-10 LAB — BASIC METABOLIC PANEL
BUN: 15 (ref 4–21)
Creatinine: 1 (ref 0.5–1.1)
Glucose: 105
Potassium: 4 (ref 3.4–5.3)
Sodium: 137 (ref 137–147)

## 2019-09-10 LAB — LIPID PANEL
Cholesterol: 241 — AB (ref 0–200)
HDL: 99 — AB (ref 35–70)
LDL Cholesterol: 128
Triglycerides: 72 (ref 40–160)

## 2019-09-10 LAB — TSH: TSH: 1.76 (ref 0.41–5.90)

## 2019-09-10 LAB — CHLORIDE
Anion gap: 7.8
Calcium: 8.8
Carbon Dioxide, Total: 30
Chloride: 103
EGFR (Non-African Amer.): 60
Thyroxine (T4): 5.2

## 2019-09-10 LAB — CBC AND DIFFERENTIAL
HCT: 34 — AB (ref 36–46)
Hemoglobin: 12.7 (ref 12.0–16.0)
Platelets: 163 (ref 150–399)
WBC: 5.3

## 2019-09-10 LAB — HEPATIC FUNCTION PANEL
ALT: 9 (ref 7–35)
AST: 17 (ref 13–35)
Alkaline Phosphatase: 72 (ref 25–125)
Bilirubin, Total: 0.2

## 2019-09-11 ENCOUNTER — Encounter: Payer: Self-pay | Admitting: Internal Medicine

## 2019-09-11 DIAGNOSIS — H409 Unspecified glaucoma: Secondary | ICD-10-CM | POA: Insufficient documentation

## 2019-09-11 DIAGNOSIS — F0280 Dementia in other diseases classified elsewhere without behavioral disturbance: Secondary | ICD-10-CM | POA: Insufficient documentation

## 2019-09-11 DIAGNOSIS — F0150 Vascular dementia without behavioral disturbance: Secondary | ICD-10-CM | POA: Insufficient documentation

## 2019-09-11 LAB — NOVEL CORONAVIRUS, NAA: SARS-CoV-2, NAA: NOT DETECTED

## 2019-09-16 ENCOUNTER — Encounter: Payer: Self-pay | Admitting: *Deleted

## 2019-09-23 ENCOUNTER — Encounter: Payer: Self-pay | Admitting: Internal Medicine

## 2019-11-17 ENCOUNTER — Encounter: Payer: Self-pay | Admitting: Internal Medicine

## 2019-12-01 ENCOUNTER — Non-Acute Institutional Stay: Payer: Medicare Other | Admitting: Adult Health

## 2019-12-01 ENCOUNTER — Encounter: Payer: Self-pay | Admitting: Adult Health

## 2019-12-01 DIAGNOSIS — Z0000 Encounter for general adult medical examination without abnormal findings: Secondary | ICD-10-CM | POA: Diagnosis not present

## 2019-12-01 NOTE — Progress Notes (Signed)
Subjective:   Tracy Nielsen is a 85 y.o. female who presents for Medicare Annual (Subsequent) preventive examination at [facility], assisted living.   Review of Systems:   Cardiac Risk Factors include: advanced age (>20men, >108 women);hypertension;smoking/ tobacco exposure     Objective:     Vitals: Wt 124 lb 11.2 oz (56.6 kg)   BMI 21.40 kg/m   Body mass index is 21.4 kg/m.  Advanced Directives 12/01/2019 09/09/2019 11/09/2015  Does Patient Have a Medical Advance Directive? Yes Yes Yes  Type of Paramedic of [place];Living will;Out of facility DNR (pink MOST or yellow form) Healthcare Power of [place];Living will  Does patient want to make changes to medical advance directive? No - Patient declined No - Patient declined -  Copy of [place] in Chart? Yes - validated most recent copy scanned in chart (See row information) Yes - validated most recent copy scanned in chart (See row information) -    Tobacco Social History   Tobacco Use  Smoking Status Former Smoker  . Packs/day: 0.50  . Years: 25.00  . Pack years: 12.50  . Types: Cigarettes  . Quit date: 12/03/1973  . Years since quitting: 46.0  Smokeless Tobacco Never Used     Counseling given: Not Answered   Clinical Intake:  Pre-visit preparation completed: No  Pain : No/denies pain     BMI - recorded: 21.4 Nutritional Status: BMI of 19-24  Normal Nutritional Risks: None Diabetes: No  How often do you need to have someone help you when you read instructions, pamphlets, or other written materials from your doctor or pharmacy?: 3 - Sometimes What is the last grade level you completed in school?: 3 years of college  Interpreter Needed?: No  Information entered by :: Royal Hawthorn NP  Past Medical History:  Diagnosis Date  . Arthritis    Per [place] new patient packet   . Epilepsy ([place])    Per [place] new patient  packet   . Glaucoma   . Hearing deficit    Hearing aids  . High blood pressure    Per [facility] new patient packet   . History of abnormal weight loss   . Low back pain   . Memory difficulty 05/09/2016  . MGUS (monoclonal gammopathy of unknown significance)   . Neuropathy associated with MGUS ([place]) 11/09/2015  . Radiculopathy of sacral and sacrococcygeal region    S1  . Seizures ([place])    Past Surgical History:  Procedure Laterality Date  . APPENDECTOMY    . BACK SURGERY    . CATARACT EXTRACTION Left   . OVARY SURGERY    . PARATHYROIDECTOMY  9/  . TRABECULECTOMY Left    Family History  Problem Relation Age of Onset  . Congestive Heart Failure Mother   . Parkinsonism Father   . Heart attack Father   . Colon cancer Sister   . Multiple sclerosis Daughter   . Muscular dystrophy Grandchild   . Muscular dystrophy Grandchild    Social History   Socioeconomic History  . Marital status: Married    Spouse name: Not on file  . Number of children: 4  . Years of education: 68  . Highest education level: Not on file  Occupational History  . Occupation: Retired  Tobacco Use  . Smoking status: Former Smoker    Packs/day: 0.50    Years: 25.00    Pack years: 12.50    Types:  Cigarettes    Quit date: 12/03/1973    Years since quitting: 46.0  . Smokeless tobacco: Never Used  Substance and Sexual Activity  . Alcohol use: Yes    Comment: Consumes 2 glasses of wine per day  . Drug use: No  . Sexual activity: Not Currently  Other Topics Concern  . Not on file  Social History Narrative   Lives at home w/ her husband.   Patient drinks about 2 cups of caffeine daily.   Patient is right handed.       Per [place] Patient Packet, Abstracted 08/26/2019   Diet:Normal      Caffeine:Yes      Married, if yes what year: Widow 48 (div 32), remarried 1988      Do you live in a house, apartment, assisted living, condo, trailer, ect: Assisted Living ([place]), 1 stories, 1 person      Pets: No      Current/Past profession: Academic librarian, organize affairs for elderly      Exercise: Yes, walking daily       Living Will: Yes   DNR: No, would like to discuss one--did discuss on 10/9 and DNR form completed, copy made for vynca, and original placed in paper chart; HCPOA, [name], also present and agreeable with his mother-in-law decision      POA/HPOA: Yes      Functional Status:   Do you have difficulty bathing or dressing yourself? no   Do you have difficulty preparing food or eating? Prep yes, eating no   Do you have difficulty managing your medications? yes   Do you have difficulty managing your finances? SIL helps   Do you have difficulty affording your medications?  no   Social Determinants of Health   Financial Resource Strain:   . Difficulty of Paying Living Expenses: Not on file  Food Insecurity:   . Worried About Charity fundraiser in the Last Year: Not on file  . Ran Out of Food in the Last Year: Not on file  Transportation Needs:   . Lack of Transportation (Medical): Not on file  . Lack of Transportation (Non-Medical): Not on file  Physical Activity:   . Days of Exercise per Week: Not on file  . Minutes of Exercise per Session: Not on file  Stress:   . Feeling of Stress : Not on file  Social Connections:   . Frequency of Communication with Friends and Family: Not on file  . Frequency of Social Gatherings with Friends and Family: Not on file  . Attends Religious Services: Not on file  . Active Member of Clubs or Organizations: Not on file  . Attends Archivist Meetings: Not on file  . Marital Status: Not on file    Outpatient Encounter Medications as of 12/01/2019  Medication Sig  . brimonidine (ALPHAGAN) 0.2 % ophthalmic solution Place 1 drop into the right eye 3 (three) times daily. Wait 10 minutes between drops. (Keep eyes closed for 2 minutes after each drop)  . Cholecalciferol (VITAMIN D)  2000 UNITS CAPS Take 2,000 Units by mouth daily.  Marland Kitchen donepezil (ARICEPT) 10 MG tablet Take 10 mg by mouth at bedtime.  . dorzolamide-timolol (COSOPT) 22.3-6.8 MG/ML ophthalmic solution Place 1 drop into the right eye 2 (two) times daily. Wait 10 minutes between drops. (Keep eyes closed for 2 minutes after each drop)  . lamoTRIgine (LAMICTAL) 100 MG tablet Take 1 tablet in morning, 1 and  1/2 tablets at night  . LUMIGAN 0.01 % SOLN Place 1 drop into the right eye at bedtime. Wait 10 minutes between drops. (Keep eyes closed for 2 minutes after each drop)  . Multiple Vitamin (MULTIVITAMIN) tablet Take 1 tablet by mouth daily.  Marland Kitchen olmesartan (BENICAR) 20 MG tablet Take 20 mg by mouth daily.  . pilocarpine (PILOCAR) 4 % ophthalmic solution Place 1 drop into the right eye 4 (four) times daily. Wait 10 minutes between drops. (Keep eyes closed for 2 minutes after each drop)  . Polyethyl Glycol-Propyl Glycol (SYSTANE) 0.4-0.3 % SOLN Place 1 drop into the left eye as needed. Wait 10 minutes between drops. (Keep eyes closed for 2 minutes after each drop)   No facility-administered encounter medications on file as of 12/01/2019.    Activities of Daily Living In your present state of health, do you have any difficulty performing the following activities: 12/01/2019  Hearing? N  Vision? Y  Difficulty concentrating or making decisions? Y  Walking or climbing stairs? Y  Dressing or bathing? N  Doing errands, shopping? Y  Preparing Food and eating ? Y  Using the Toilet? Y  In the past six months, have you accidently leaked urine? N  Do you have problems with loss of bowel control? N  Managing your Medications? Y  Managing your Finances? Y  Housekeeping or managing your Housekeeping? Y  Some recent data might be hidden    Patient Care Team: Gayland Curry, DO as PCP - General (Geriatric Medicine) [name], MD as Consulting Physician (Neurology)    Assessment:   This is a routine wellness  examination for [place].  Exercise Activities and Dietary recommendations Current Exercise Habits: The patient does not participate in regular exercise at present, Exercise limited by: None identified  Goals    . DIET - EAT MORE FRUITS AND VEGETABLES       Fall Risk Fall Risk  12/01/2019  Falls in the past year? 0  Number falls in past yr: 0  Injury with Fall? 0  Follow up Falls evaluation completed   Is the patient's home free of loose throw rugs in walkways, pet beds, electrical cords, etc?   yes      Grab bars in the bathroom? yes      Handrails on the stairs?   yes      Adequate lighting?   yes  Timed Get Up and Go performed: 2 seconds  Depression Screen PHQ 2/9 Scores 12/01/2019  PHQ - 2 Score 0     Cognitive Function MMSE - Mini Mental State Exam 12/01/2019 01/14/2018 01/09/2017 05/09/2016 11/09/2015  Orientation to time 2 4 4 4 5   Orientation to Place 5 5 5 5 5   Registration 3 3 3 3 3   Attention/ Calculation 5 5 5 5 5   Recall 0 1 1 2 2   Language- name 2 objects 2 2 2 2 2   Language- repeat 1 1 1 1 1   Language- follow 3 step command 3 3 3 3 3   Language- read & follow direction 1 1 1 1 1   Write a sentence 1 1 1 1 1   Copy design 1 1 1 1 1   Total score 24 27 27 28 29         Immunization History  Administered Date(s) Administered  . Influenza, High Dose Seasonal PF 09/26/2015, 09/03/2017, 08/28/2018  . Influenza,inj,Quad PF,6+ Mos 10/01/2019  . Influenza-Unspecified 09/22/2009, 08/27/2011, 10/08/2012, 12/08/2014, 02/10/2018  . Pneumococcal Conjugate-13 12/31/2013  .  Pneumococcal Polysaccharide-23 12/04/1999, 02/02/2016  . Td 12/04/2000  . Tdap 11/07/2012    Qualifies for Shingles Vaccine?yes but she declined  Screening Tests Health Maintenance  Topic Date Due  . DEXA SCAN  02/03/1997  . TETANUS/TDAP  11/07/2022  . INFLUENZA VACCINE  Completed  . PNA vac Low Risk Adult  Completed    Cancer Screenings: Lung: Low Dose CT Chest recommended if Age 49-80  years, 30 pack-year currently smoking OR have quit w/in 15years. Patient does not qualify. Breast:  Up to date on Mammogram? No   Up to date of Bone Density/Dexa? No Colorectal: aged out  Additional Screenings: not indicated: Hepatitis C Screening:      Plan:      I have personally reviewed and noted the following in the patient's chart:   . Medical and social history . Use of alcohol, tobacco or illicit drugs  . Current medications and supplements . Functional ability and status . Nutritional status . Physical activity . Advanced directives . List of other physicians . Hospitalizations, surgeries, and ER visits in previous 12 months . Vitals . Screenings to include cognitive, depression, and falls . Referrals and appointments  In addition, I have reviewed and discussed with patient certain preventive protocols, quality metrics, and best practice recommendations. A written personalized care plan for preventive services as well as general preventive health recommendations were provided to patient.     Royal Hawthorn, NP  12/01/2019

## 2019-12-01 NOTE — Patient Instructions (Signed)
Tracy Nielsen , Thank you for taking time to come for your Medicare Wellness Visit. I appreciate your ongoing commitment to your health goals. Please review the following plan we discussed and let me know if I can assist you in the future.   Screening recommendations/referrals: Colonoscopy aged out Mammogram aged out Bone Density due for bone density but will wait until pandemic improves Recommended yearly ophthalmology/optometry visit for glaucoma screening and checkup Recommended yearly dental visit for hygiene and checkup  Vaccinations: Influenza vaccine up to date Pneumococcal vaccine  Up to date Tdap vaccine up to date Shingles vaccine  declined    Advanced directives:  reviewed  Conditions/risks identified: cardiac  Next appointment: 1 year   Preventive Care 11 Years and Older, Female Preventive care refers to lifestyle choices and visits with your health care provider that can promote health and wellness. What does preventive care include?  A yearly physical exam. This is also called an annual well check.  Dental exams once or twice a year.  Routine eye exams. Ask your health care provider how often you should have your eyes checked.  Personal lifestyle choices, including:  Daily care of your teeth and gums.  Regular physical activity.  Eating a healthy diet.  Avoiding tobacco and drug use.  Limiting alcohol use.  Practicing safe sex.  Taking low-dose aspirin every day.  Taking vitamin and mineral supplements as recommended by your health care provider. What happens during an annual well check? The services and screenings done by your health care provider during your annual well check will depend on your age, overall health, lifestyle risk factors, and family history of disease. Counseling  Your health care provider may ask you questions about your:  Alcohol use.  Tobacco use.  Drug use.  Emotional well-being.  Home and relationship well-being.   Sexual activity.  Eating habits.  History of falls.  Memory and ability to understand (cognition).  Work and work Statistician.  Reproductive health. Screening  You may have the following tests or measurements:  Height, weight, and BMI.  Blood pressure.  Lipid and cholesterol levels. These may be checked every 5 years, or more frequently if you are over 56 years old.  Skin check.  Lung cancer screening. You may have this screening every year starting at age 4 if you have a 30-pack-year history of smoking and currently smoke or have quit within the past 15 years.  Fecal occult blood test (FOBT) of the stool. You may have this test every year starting at age 46.  Flexible sigmoidoscopy or colonoscopy. You may have a sigmoidoscopy every 5 years or a colonoscopy every 10 years starting at age 59.  Hepatitis C blood test.  Hepatitis B blood test.  Sexually transmitted disease (STD) testing.  Diabetes screening. This is done by checking your blood sugar (glucose) after you have not eaten for a while (fasting). You may have this done every 1-3 years.  Bone density scan. This is done to screen for osteoporosis. You may have this done starting at age 64.  Mammogram. This may be done every 1-2 years. Talk to your health care provider about how often you should have regular mammograms. Talk with your health care provider about your test results, treatment options, and if necessary, the need for more tests. Vaccines  Your health care provider may recommend certain vaccines, such as:  Influenza vaccine. This is recommended every year.  Tetanus, diphtheria, and acellular pertussis (Tdap, Td) vaccine. You may need a Td  booster every 10 years.  Zoster vaccine. You may need this after age 36.  Pneumococcal 13-valent conjugate (PCV13) vaccine. One dose is recommended after age 60.  Pneumococcal polysaccharide (PPSV23) vaccine. One dose is recommended after age 41. Talk to your  health care provider about which screenings and vaccines you need and how often you need them. This information is not intended to replace advice given to you by your health care provider. Make sure you discuss any questions you have with your health care provider. Document Released: 12/16/2015 Document Revised: 08/08/2016 Document Reviewed: 09/20/2015 Elsevier Interactive Patient Education  2017 [employer] Prevention in the Home Falls can cause injuries. They can happen to people of all ages. There are many things you can do to make your home safe and to help prevent falls. What can I do on the outside of my home?  Regularly fix the edges of walkways and driveways and fix any cracks.  Remove anything that might make you trip as you walk through a door, such as a raised step or threshold.  Trim any bushes or trees on the path to your home.  Use bright outdoor lighting.  Clear any walking paths of anything that might make someone trip, such as rocks or tools.  Regularly check to see if handrails are loose or broken. Make sure that both sides of any steps have handrails.  Any raised decks and porches should have guardrails on the edges.  Have any leaves, snow, or ice cleared regularly.  Use sand or salt on walking paths during winter.  Clean up any spills in your garage right away. This includes oil or grease spills. What can I do in the bathroom?  Use night lights.  Install grab bars by the toilet and in the tub and shower. Do not use towel bars as grab bars.  Use non-skid mats or decals in the tub or shower.  If you need to sit down in the shower, use a plastic, non-slip stool.  Keep the floor dry. Clean up any water that spills on the floor as soon as it happens.  Remove soap buildup in the tub or shower regularly.  Attach bath mats securely with double-sided non-slip rug tape.  Do not have throw rugs and other things on the floor that can make you trip. What  can I do in the bedroom?  Use night lights.  Make sure that you have a light by your bed that is easy to reach.  Do not use any sheets or blankets that are too big for your bed. They should not hang down onto the floor.  Have a firm chair that has side arms. You can use this for support while you get dressed.  Do not have throw rugs and other things on the floor that can make you trip. What can I do in the kitchen?  Clean up any spills right away.  Avoid walking on wet floors.  Keep items that you use a lot in easy-to-reach places.  If you need to reach something above you, use a strong step stool that has a grab bar.  Keep electrical cords out of the way.  Do not use floor polish or wax that makes floors slippery. If you must use wax, use non-skid floor wax.  Do not have throw rugs and other things on the floor that can make you trip. What can I do with my stairs?  Do not leave any items on the stairs.  Make sure that there are handrails on both sides of the stairs and use them. Fix handrails that are broken or loose. Make sure that handrails are as long as the stairways.  Check any carpeting to make sure that it is firmly attached to the stairs. Fix any carpet that is loose or worn.  Avoid having throw rugs at the top or bottom of the stairs. If you do have throw rugs, attach them to the floor with carpet tape.  Make sure that you have a light switch at the top of the stairs and the bottom of the stairs. If you do not have them, ask someone to add them for you. What else can I do to help prevent falls?  Wear shoes that:  Do not have high heels.  Have rubber bottoms.  Are comfortable and fit you well.  Are closed at the toe. Do not wear sandals.  If you use a stepladder:  Make sure that it is fully opened. Do not climb a closed stepladder.  Make sure that both sides of the stepladder are locked into place.  Ask someone to hold it for you, if possible.   Clearly mark and make sure that you can see:  Any grab bars or handrails.  First and last steps.  Where the edge of each step is.  Use tools that help you move around (mobility aids) if they are needed. These include:  Canes.  Walkers.  Scooters.  Crutches.  Turn on the lights when you go into a dark area. Replace any light bulbs as soon as they burn out.  Set up your furniture so you have a clear path. Avoid moving your furniture around.  If any of your floors are uneven, fix them.  If there are any pets around you, be aware of where they are.  Review your medicines with your doctor. Some medicines can make you feel dizzy. This can increase your chance of falling. Ask your doctor what other things that you can do to help prevent falls. This information is not intended to replace advice given to you by your health care provider. Make sure you discuss any questions you have with your health care provider. Document Released: 09/15/2009 Document Revised: 04/26/2016 Document Reviewed: 12/24/2014 Elsevier Interactive Patient Education  2017 [employer].

## 2019-12-16 ENCOUNTER — Encounter: Payer: Self-pay | Admitting: Internal Medicine

## 2020-01-13 ENCOUNTER — Non-Acute Institutional Stay: Payer: Medicare Other | Admitting: Internal Medicine

## 2020-01-13 ENCOUNTER — Encounter: Payer: Self-pay | Admitting: Internal Medicine

## 2020-01-13 ENCOUNTER — Other Ambulatory Visit: Payer: Self-pay

## 2020-01-13 VITALS — BP 122/64 | HR 84 | Temp 97.5°F | Ht 64.0 in | Wt 124.4 lb

## 2020-01-13 DIAGNOSIS — F0280 Dementia in other diseases classified elsewhere without behavioral disturbance: Secondary | ICD-10-CM

## 2020-01-13 DIAGNOSIS — J449 Chronic obstructive pulmonary disease, unspecified: Secondary | ICD-10-CM | POA: Diagnosis not present

## 2020-01-13 DIAGNOSIS — E042 Nontoxic multinodular goiter: Secondary | ICD-10-CM

## 2020-01-13 DIAGNOSIS — G309 Alzheimer's disease, unspecified: Secondary | ICD-10-CM

## 2020-01-13 DIAGNOSIS — M8949 Other hypertrophic osteoarthropathy, multiple sites: Secondary | ICD-10-CM

## 2020-01-13 DIAGNOSIS — F0150 Vascular dementia without behavioral disturbance: Secondary | ICD-10-CM

## 2020-01-13 DIAGNOSIS — G63 Polyneuropathy in diseases classified elsewhere: Secondary | ICD-10-CM

## 2020-01-13 DIAGNOSIS — M810 Age-related osteoporosis without current pathological fracture: Secondary | ICD-10-CM | POA: Diagnosis not present

## 2020-01-13 DIAGNOSIS — M159 Polyosteoarthritis, unspecified: Secondary | ICD-10-CM

## 2020-01-13 DIAGNOSIS — D472 Monoclonal gammopathy: Secondary | ICD-10-CM

## 2020-01-13 NOTE — Progress Notes (Signed)
Location:  Occupational psychologist of Service:  Clinic (12)  Provider: Devota Viruet L. Mariea Clonts, D.O., C.M.D.  Code Status: DNR Goals of Care:  Advanced Directives 01/13/2020  Does Patient Have a Medical Advance Directive? Yes  Type of Paramedic of [place];Living will;Out of facility DNR (pink MOST or yellow form)  Does patient want to make changes to medical advance directive? No - Patient declined  Copy of [place] in Chart? -  Pre-existing out of facility DNR order (yellow form or pink MOST form) Pink MOST/Yellow Form most recent copy in chart - Physician notified to receive inpatient order   Chief Complaint  Patient presents with  . Medical Management of Chronic Issues    4 month follow up     HPI: Patient is a 86 y.o. Nielsen seen today for medical management of chronic diseases.  She lives in AL due to her dementia.  Says she is feeling good.    Has usual aches and pains.  If she sleeps wrong.  A back thing will come and go.  Nothing that lasts.  She sleeps well at night.  Says it's lonely lately.  Talks about her husband passing away not that long ago.  Says people are friendly, but it's quiet and lonely at night.    She had her second covid vaccine yesterday and has some soreness in her right shoulder.  Weight has stabilized here.  Sees well.  Can even do w/o her glasses.  Hears alright, too.  Seems she is a [place] bit HOH.  MMSE - Mini Mental State Exam 12/01/2019 01/14/2018 01/09/2017  Orientation to time 2 4 4   Orientation to Place 5 5 5   Registration 3 3 3   Attention/ Calculation 5 5 5   Recall 0 1 1  Language- name 2 objects 2 2 2   Language- repeat 1 1 1   Language- follow 3 step command 3 3 3   Language- read & follow direction 1 1 1   Write a sentence 1 1 1   Copy design 1 1 1   Total score 24 27 27     Past Medical History:  Diagnosis Date  . Arthritis    Per [place] new patient packet   . Epilepsy ([place])    Per [place] new patient packet   . Glaucoma   . Hearing deficit    Hearing aids  . High blood pressure    Per [facility] new patient packet   . History of abnormal weight loss   . Low back pain   . Memory difficulty 05/09/2016  . MGUS (monoclonal gammopathy of unknown significance)   . Neuropathy associated with MGUS ([place]) 11/09/2015  . Radiculopathy of sacral and sacrococcygeal region    S1  . Seizures ([place])     Past Surgical History:  Procedure Laterality Date  . APPENDECTOMY    . BACK SURGERY    . CATARACT EXTRACTION Left   . OVARY SURGERY    . PARATHYROIDECTOMY  9/  . TRABECULECTOMY Left     Allergies  Allergen Reactions  . Hctz [Hydrochlorothiazide]   . Other Other (See Comments)    Caused sodium to drop    Outpatient Encounter Medications as of 01/13/2020  Medication Sig  . brimonidine (ALPHAGAN) 0.2 % ophthalmic solution Place 1 drop into the right eye 3 (three) times daily. Wait 10 minutes between drops. (Keep eyes closed for 2 minutes after each drop)  . Cholecalciferol (VITAMIN D) 2000 UNITS CAPS Take 2,000  Units by mouth daily.  Marland Kitchen donepezil (ARICEPT) 10 MG tablet Take 10 mg by mouth at bedtime.  . dorzolamide-timolol (COSOPT) 22.3-6.8 MG/ML ophthalmic solution Place 1 drop into the right eye 2 (two) times daily. Wait 10 minutes between drops. (Keep eyes closed for 2 minutes after each drop)  . lamoTRIgine (LAMICTAL) 100 MG tablet Take 1 tablet in morning, 1 and 1/2 tablets at night  . LUMIGAN 0.01 % SOLN Place 1 drop into the right eye at bedtime. Wait 10 minutes between drops. (Keep eyes closed for 2 minutes after each drop)  . Multiple Vitamin (MULTIVITAMIN) tablet Take 1 tablet by mouth daily.  Marland Kitchen olmesartan (BENICAR) 20 MG tablet Take 20 mg by mouth daily.  . pilocarpine (PILOCAR) 4 % ophthalmic solution Place 1 drop into the right eye 4 (four) times daily. Wait 10 minutes between drops. (Keep eyes closed for 2 minutes after each drop)  . Polyethyl Glycol-Propyl Glycol  (SYSTANE) 0.4-0.3 % SOLN Place 1 drop into the left eye as needed. Wait 10 minutes between drops. (Keep eyes closed for 2 minutes after each drop)   No facility-administered encounter medications on file as of 01/13/2020.    Review of Systems:  Review of Systems  Constitutional: Negative for chills, fever and malaise/fatigue.  HENT: Negative for congestion and sore throat.   Eyes: Negative for blurred vision.       Glaucoma but says she sees well  Respiratory: Negative for cough and shortness of breath.   Cardiovascular: Negative for chest pain, palpitations and leg swelling.  Gastrointestinal: Negative for abdominal pain, constipation, diarrhea, nausea and vomiting.  Genitourinary: Negative for dysuria.  Musculoskeletal: Positive for back pain, joint pain and myalgias. Negative for falls.  Skin: Negative for itching and rash.  Neurological: Negative for dizziness and loss of consciousness.  Endo/Heme/Allergies: Bruises/bleeds easily.  Psychiatric/Behavioral: Positive for memory loss. Negative for depression. The patient is not nervous/anxious and does not have insomnia.     Health Maintenance  Topic Date Due  . DEXA SCAN  02/03/1997  . TETANUS/TDAP  11/07/2022  . INFLUENZA VACCINE  Completed  . PNA vac Low Risk Adult  Completed    Physical Exam: Vitals:   01/13/20 1130  BP: 122/64  Pulse: 84  Temp: (!) 97.5 F (36.4 C)  TempSrc: Temporal  SpO2: 96%  Weight: 124 lb 6.4 oz (56.4 kg)  Height: 5\' 4"  (1.626 m)   Body mass index is 21.35 kg/m. Physical Exam Vitals reviewed.  Constitutional:      Appearance: Normal appearance.  HENT:     Head: Normocephalic and atraumatic.  Eyes:     Comments: glasses  Cardiovascular:     Rate and Rhythm: Normal rate and regular rhythm.     Pulses: Normal pulses.  Pulmonary:     Effort: Pulmonary effort is normal.     Breath sounds: Normal breath sounds. No wheezing, rhonchi or rales.  Abdominal:     General: Bowel sounds are  normal.  Musculoskeletal:        General: Normal range of motion.     Right lower leg: No edema.     Left lower leg: No edema.  Skin:    General: Skin is warm and dry.  Neurological:     General: No focal deficit present.     Mental Status: She is alert.     Cranial Nerves: No cranial nerve deficit.     Gait: Gait abnormal.     Comments: Diminished sensation in feet/legs; did  not remember me and needed assistance to find way back to AL from clinic  Psychiatric:        Mood and Affect: Mood normal.        Behavior: Behavior normal.     Comments: Very pleasant and sociable     Labs reviewed: Basic Metabolic Panel: Recent Labs    06/25/19 1553 09/10/19 0000  NA 138 137  K 4.3 4.0  CL 103  --   CO2 21  --   GLUCOSE 92  --   BUN 17 15  CREATININE 1.12* 1.0  CALCIUM 9.1  --   TSH  --  1.76   Liver Function Tests: Recent Labs    06/25/19 1553 09/10/19 0000  AST 23 17  ALT 14 9  ALKPHOS 69 72  BILITOT 0.2  --   PROT 6.2  --   ALBUMIN 4.4  --    No results for input(s): LIPASE, AMYLASE in the last 8760 hours. No results for input(s): AMMONIA in the last 8760 hours. CBC: Recent Labs    06/25/19 1553 09/10/19 0000  WBC 4.7 5.3  NEUTROABS 2.9  --   HGB 12.2 12.7  HCT 35.7 34*  MCV 98*  --   PLT 168 163   Lipid Panel: Recent Labs    09/10/19 0000  CHOL 241*  HDL 99*  LDLCALC 128  TRIG 72   Assessment/Plan 1. Multiple thyroid nodules -has not had abnormal thyroid tests, no related symptoms, monitor  2. Senile osteoporosis -continue vitamin D3, walking for exercise, ideally should be on treatment so we may consider this when her family can be part of visits  3. Chronic obstructive pulmonary disease, unspecified COPD type ([place]) -not on inhalers or therapy at present -monitor  4. Neuropathy associated with MGUS ([facility]) -not on meds for this at present--does not complain of burning or pain related to that  5. Mixed Alzheimer's and vascular dementia  ([place]) -very pleasant and disoriented -on aricept 10mg  nightly and tolerating well -cont AL support  6. Primary osteoarthritis involving multiple joints -having some discomfort in neck and shoulders after sleeping wrong -working with OT and has some voltaren gel ordered   Labs/tests ordered:  No new (appears annual labs will be appropriate which would be around October) Next appt:  05/04/2020   Cuauhtemoc Huegel L. Brexton Sofia, D.O. [facility] [address], [place] [address] Cell Phone (Mon-Fri 8am-5pm):  [phone] On Call:  [phone] & follow prompts after 5pm & weekends Office Phone:  [phone] Office Fax:  [phone]

## 2020-02-04 DIAGNOSIS — R2689 Other abnormalities of gait and mobility: Secondary | ICD-10-CM | POA: Diagnosis not present

## 2020-02-04 DIAGNOSIS — M8949 Other hypertrophic osteoarthropathy, multiple sites: Secondary | ICD-10-CM | POA: Diagnosis not present

## 2020-02-04 DIAGNOSIS — R278 Other lack of coordination: Secondary | ICD-10-CM | POA: Diagnosis not present

## 2020-02-04 DIAGNOSIS — M6389 Disorders of muscle in diseases classified elsewhere, multiple sites: Secondary | ICD-10-CM | POA: Diagnosis not present

## 2020-02-12 DIAGNOSIS — M8949 Other hypertrophic osteoarthropathy, multiple sites: Secondary | ICD-10-CM | POA: Diagnosis not present

## 2020-02-12 DIAGNOSIS — M6389 Disorders of muscle in diseases classified elsewhere, multiple sites: Secondary | ICD-10-CM | POA: Diagnosis not present

## 2020-02-12 DIAGNOSIS — R278 Other lack of coordination: Secondary | ICD-10-CM | POA: Diagnosis not present

## 2020-02-12 DIAGNOSIS — R2689 Other abnormalities of gait and mobility: Secondary | ICD-10-CM | POA: Diagnosis not present

## 2020-02-18 DIAGNOSIS — M8949 Other hypertrophic osteoarthropathy, multiple sites: Secondary | ICD-10-CM | POA: Diagnosis not present

## 2020-02-18 DIAGNOSIS — R278 Other lack of coordination: Secondary | ICD-10-CM | POA: Diagnosis not present

## 2020-02-18 DIAGNOSIS — M6389 Disorders of muscle in diseases classified elsewhere, multiple sites: Secondary | ICD-10-CM | POA: Diagnosis not present

## 2020-02-18 DIAGNOSIS — R2689 Other abnormalities of gait and mobility: Secondary | ICD-10-CM | POA: Diagnosis not present

## 2020-03-02 ENCOUNTER — Encounter: Payer: Self-pay | Admitting: Internal Medicine

## 2020-03-02 ENCOUNTER — Non-Acute Institutional Stay: Payer: Medicare Other | Admitting: Internal Medicine

## 2020-03-02 DIAGNOSIS — F0280 Dementia in other diseases classified elsewhere without behavioral disturbance: Secondary | ICD-10-CM

## 2020-03-02 DIAGNOSIS — M11231 Other chondrocalcinosis, right wrist: Secondary | ICD-10-CM

## 2020-03-02 DIAGNOSIS — F0150 Vascular dementia without behavioral disturbance: Secondary | ICD-10-CM | POA: Diagnosis not present

## 2020-03-02 DIAGNOSIS — G309 Alzheimer's disease, unspecified: Secondary | ICD-10-CM | POA: Diagnosis not present

## 2020-03-02 DIAGNOSIS — M1120 Other chondrocalcinosis, unspecified site: Secondary | ICD-10-CM | POA: Insufficient documentation

## 2020-03-02 MED ORDER — PREDNISONE 10 MG PO TABS: ORAL_TABLET | ORAL | 0 refills | Status: AC

## 2020-03-02 NOTE — Progress Notes (Addendum)
Location:  [facility] Room Number: [id number] Place of Service:  Assisted living Provider:  Llewyn Heap L. Mariea Clonts, D.O., C.M.D.  Gayland Curry, DO  Patient Care Team: Gayland Curry, DO as PCP - General (Geriatric Medicine) [name], MD as Consulting Physician (Neurology)  Extended Emergency Contact Information Primary Emergency Contact: [place] Phone: [phone] Mobile Phone: [phone] Relation: Relative  Code Status:  DNR Goals of care: Advanced Directive information Advanced Directives 03/02/2020  Does Patient Have a Medical Advance Directive? Yes  Type of Paramedic of [place];Out of facility DNR (pink MOST or yellow form)  Does patient want to make changes to medical advance directive? No - Patient declined  Copy of [place] in Chart? Yes - validated most recent copy scanned in chart (See row information)  Pre-existing out of facility DNR order (yellow form or pink MOST form) Pink MOST/Yellow Form most recent copy in chart - Physician notified to receive inpatient order     Chief Complaint  Patient presents with  . Acute Visit    Right Wrist Pain    HPI:  Pt is a 86 y.o. female with h/o osteoarthritis, dementia, COPD, hemorrhoids, seizures, MGUS with neuropathy, osteoporosis, hypertensive retinopathy, glaucoma seen today for an acute visit for right wrist pain, swelling, redness and warmth since yesterday.  She was wearing a wrist support brace that was given to her in the past but she was unable to say by whom or why.  She has had pain in the wrist before.  She has not done anything else for the pain thus far.  She's had no known injury ("I don't do anything").  Past Medical History:  Diagnosis Date  . Arthritis    Per [facility] new patient packet   . Epilepsy ([place])    Per [place] new patient packet   . Glaucoma   . Hearing deficit    Hearing aids  . High blood pressure    Per  [facility] new patient packet   . History of abnormal weight loss   . Low back pain   . Memory difficulty 05/09/2016  . MGUS (monoclonal gammopathy of unknown significance)   . Neuropathy associated with MGUS ([place]) 11/09/2015  . Radiculopathy of sacral and sacrococcygeal region    S1  . Seizures ([place])    Past Surgical History:  Procedure Laterality Date  . APPENDECTOMY    . BACK SURGERY    . CATARACT EXTRACTION Left   . OVARY SURGERY    . PARATHYROIDECTOMY  9/  . TRABECULECTOMY Left     Allergies  Allergen Reactions  . Hctz [Hydrochlorothiazide]   . Other Other (See Comments)    Caused sodium to drop    Outpatient Encounter Medications as of 03/02/2020  Medication Sig  . brimonidine (ALPHAGAN) 0.2 % ophthalmic solution Place 1 drop into the right eye 3 (three) times daily. Wait 10 minutes between drops. (Keep eyes closed for 2 minutes after each drop)  . Cholecalciferol (VITAMIN D) 2000 UNITS CAPS Take 2,000 Units by mouth daily.  Marland Kitchen donepezil (ARICEPT) 10 MG tablet Take 10 mg by mouth at bedtime.  . dorzolamide-timolol (COSOPT) 22.3-6.8 MG/ML ophthalmic solution Place 1 drop into the right eye 2 (two) times daily. Wait 10 minutes between drops. (Keep eyes closed for 2 minutes after each drop)  . lamoTRIgine (LAMICTAL) 100 MG tablet Take 1 tablet in morning, 1 and 1/2 tablets at night  . LUMIGAN 0.01 % SOLN Place  1 drop into the right eye at bedtime. Wait 10 minutes between drops. (Keep eyes closed for 2 minutes after each drop)  . Multiple Vitamin (MULTIVITAMIN) tablet Take 1 tablet by mouth daily.  Marland Kitchen olmesartan (BENICAR) 20 MG tablet Take 20 mg by mouth daily.  . pilocarpine (PILOCAR) 4 % ophthalmic solution Place 1 drop into the right eye 4 (four) times daily. Wait 10 minutes between drops. (Keep eyes closed for 2 minutes after each drop)  . Polyethyl Glycol-Propyl Glycol (SYSTANE) 0.4-0.3 % SOLN Place 1 drop into the left eye as needed. Wait 10 minutes between drops. (Keep eyes closed  for 2 minutes after each drop)  . predniSONE (DELTASONE) 10 MG tablet Take 6 tablets (60 mg total) by mouth daily with breakfast for 2 days, THEN 5 tablets (50 mg total) daily with breakfast for 2 days, THEN 4 tablets (40 mg total) daily with breakfast for 2 days, THEN 3 tablets (30 mg total) daily with breakfast for 2 days, THEN 2 tablets (20 mg total) daily with breakfast for 2 days, THEN 1 tablet (10 mg total) daily with breakfast for 2 days.   No facility-administered encounter medications on file as of 03/02/2020.    Review of Systems  Constitutional: Negative for chills, fever and malaise/fatigue.  Musculoskeletal: Positive for joint pain. Negative for falls.       Right wrist pain, swelling, redness, warmth  Neurological: Negative for weakness.  Psychiatric/Behavioral: Positive for memory loss. Negative for depression. The patient is not nervous/anxious and does not have insomnia.     Immunization History  Administered Date(s) Administered  . Influenza, High Dose Seasonal PF 09/26/2015, 09/03/2017, 08/28/2018  . Influenza,inj,Quad PF,6+ Mos 10/01/2019  . Influenza-Unspecified 09/22/2009, 08/27/2011, 10/08/2012, 12/08/2014, 02/10/2018  . Moderna SARS-COVID-2 Vaccination 12/14/2019, 01/12/2020  . Pneumococcal Conjugate-13 12/31/2013  . Pneumococcal Polysaccharide-23 12/04/1999, 02/02/2016  . Td 12/04/2000  . Tdap 11/07/2012   Pertinent  Health Maintenance Due  Topic Date Due  . DEXA SCAN  Never done  . INFLUENZA VACCINE  Completed  . PNA vac Low Risk Adult  Completed   Fall Risk  01/13/2020 12/01/2019  Falls in the past year? 0 0  Number falls in past yr: 0 0  Injury with Fall? 0 0  Follow up - Falls evaluation completed   Functional Status Survey:    Vitals:   03/02/20 1127  BP: 126/75  Pulse: 89  Temp: (!) 97.2 F (36.2 C)  SpO2: 97%  Weight: 125 lb 3.2 oz (56.8 kg)  Height: 5\' 5"  (1.651 m)   Body mass index is 20.83 kg/m. Physical Exam Vitals reviewed.   Constitutional:      General: She is not in acute distress.    Appearance: Normal appearance. She is not ill-appearing or toxic-appearing.  HENT:     Head: Normocephalic and atraumatic.  Eyes:     Comments: glasses  Pulmonary:     Effort: Pulmonary effort is normal.  Musculoskeletal:     Comments: Right wrist notably swollen vs left; also with erythema, warm to touch, exquisitely tender, full ROM possible, but very painful especially supination  Skin:    Capillary Refill: Capillary refill takes less than 2 seconds.     Findings: No bruising or rash.  Neurological:     Mental Status: She is alert.  Psychiatric:        Mood and Affect: Mood normal.     Labs reviewed: Recent Labs    06/25/19 1553 09/10/19 0000  NA 138 137  K 4.3 4.0  CL 103  --   CO2 21  --   GLUCOSE 92  --   BUN 17 15  CREATININE 1.12* 1.0  CALCIUM 9.1  --    Recent Labs    06/25/19 1553 09/10/19 0000  AST 23 17  ALT 14 9  ALKPHOS 69 72  BILITOT 0.2  --   PROT 6.2  --   ALBUMIN 4.4  --    Recent Labs    06/25/19 1553 09/10/19 0000  WBC 4.7 5.3  NEUTROABS 2.9  --   HGB 12.2 12.7  HCT 35.7 34*  MCV 98*  --   PLT 168 163   Lab Results  Component Value Date   TSH 1.76 09/10/2019   No results found for: HGBA1C Lab Results  Component Value Date   CHOL 241 (A) 09/10/2019   HDL 99 (A) 09/10/2019   LDLCALC 128 09/10/2019   TRIG 72 09/10/2019    Assessment/Plan 1. Pseudogout of right wrist -will treat her with a prednisone taper using 10mg  tabs -use ice/cold compress 20 mins qid until resolved -may use wrist support -check uric acid to r/o gout though unlikely -I don't feel an xray is necessary without known injury and based on appearance, it looks like inflammatory arthritis  2. Mixed Alzheimer's and vascular dementia ([place]) -history not complete about prior wrist pathology due to her dementia -she is lovely, sweet and pleasant, well-placed in AL setting  Family/ staff  Communication: discussed with AL nurse manager  Labs/tests ordered:  Uric acid  Keep regular visit 05/04/2020  Koltin Wehmeyer L. Dakiya Puopolo, D.O. [facility] [address], [place] [address] Cell Phone (Mon-Fri 8am-5pm):  [phone] On Call:  [phone] & follow prompts after 5pm & weekends Office Phone:  [phone] Office Fax:  [phone]

## 2020-03-03 ENCOUNTER — Encounter: Payer: Self-pay | Admitting: Internal Medicine

## 2020-03-03 DIAGNOSIS — M109 Gout, unspecified: Secondary | ICD-10-CM | POA: Diagnosis not present

## 2020-03-03 DIAGNOSIS — E785 Hyperlipidemia, unspecified: Secondary | ICD-10-CM | POA: Diagnosis not present

## 2020-03-11 ENCOUNTER — Encounter: Payer: Self-pay | Admitting: Internal Medicine

## 2020-05-04 ENCOUNTER — Other Ambulatory Visit: Payer: Self-pay

## 2020-05-04 ENCOUNTER — Non-Acute Institutional Stay: Payer: Medicare Other | Admitting: Internal Medicine

## 2020-05-04 ENCOUNTER — Other Ambulatory Visit: Payer: Self-pay | Admitting: Internal Medicine

## 2020-05-04 ENCOUNTER — Encounter: Payer: Self-pay | Admitting: Internal Medicine

## 2020-05-04 VITALS — BP 128/76 | HR 76 | Temp 97.8°F | Wt 125.0 lb

## 2020-05-04 DIAGNOSIS — M810 Age-related osteoporosis without current pathological fracture: Secondary | ICD-10-CM

## 2020-05-04 DIAGNOSIS — F0150 Vascular dementia without behavioral disturbance: Secondary | ICD-10-CM

## 2020-05-04 DIAGNOSIS — M159 Polyosteoarthritis, unspecified: Secondary | ICD-10-CM

## 2020-05-04 DIAGNOSIS — G309 Alzheimer's disease, unspecified: Secondary | ICD-10-CM

## 2020-05-04 DIAGNOSIS — M11231 Other chondrocalcinosis, right wrist: Secondary | ICD-10-CM | POA: Diagnosis not present

## 2020-05-04 DIAGNOSIS — J449 Chronic obstructive pulmonary disease, unspecified: Secondary | ICD-10-CM | POA: Diagnosis not present

## 2020-05-04 DIAGNOSIS — M8949 Other hypertrophic osteoarthropathy, multiple sites: Secondary | ICD-10-CM

## 2020-05-04 DIAGNOSIS — Z66 Do not resuscitate: Secondary | ICD-10-CM

## 2020-05-04 DIAGNOSIS — F0280 Dementia in other diseases classified elsewhere without behavioral disturbance: Secondary | ICD-10-CM

## 2020-05-04 DIAGNOSIS — M8580 Other specified disorders of bone density and structure, unspecified site: Secondary | ICD-10-CM

## 2020-05-04 NOTE — Progress Notes (Signed)
Location:  Occupational psychologist of Service:  Clinic (12)  Provider: Shenique Childers L. Mariea Clonts, D.O., C.M.D.  Code Status: DNR Goals of Care:  Advanced Directives 05/04/2020  Does Patient Have a Medical Advance Directive? Yes  Type of Paramedic of [place];Out of facility DNR (pink MOST or yellow form)  Does patient want to make changes to medical advance directive? No - Patient declined  Copy of [place] in Chart? Yes - validated most recent copy scanned in chart (See row information)  Pre-existing out of facility DNR order (yellow form or pink MOST form) Pink MOST/Yellow Form most recent copy in chart - Physician notified to receive inpatient order   Chief Complaint  Patient presents with  . Medical Management of Chronic Issues     Follow up on R wrist pain.     HPI: Patient is a 86 y.o. female seen today for medical management of chronic diseases.    Inez Catalina is doing great.  She has no complaints. Her right wrist pseudogout flare has resolved.  She is "running around" like usual.  She keeps herself stimulated by reading, doing activities, walking around, avoids putting the tv on until after 5pm.    Says the only things she has wrong are from being old.  She's cheerful and bubbly.  Told me about the new plant she got when shopping with her son at [place].    Spirits are good.  Wishes she was not here and doesn't think she needs help, but doing fine.    Is due for a bone density--need to confirm whether family agreeable to this--likely would need a medication to treat osteoporosis if present.   Past Medical History:  Diagnosis Date  . Arthritis    Per [place] new patient packet   . Epilepsy ([place])    Per [facility] new patient packet   . Glaucoma   . Hearing deficit    Hearing aids  . High blood pressure    Per [facility] new patient packet   . History of abnormal weight loss   . Low back pain   . Memory difficulty 05/09/2016  . MGUS  (monoclonal gammopathy of unknown significance)   . Neuropathy associated with MGUS ([place]) 11/09/2015  . Radiculopathy of sacral and sacrococcygeal region    S1  . Seizures ([place])     Past Surgical History:  Procedure Laterality Date  . APPENDECTOMY    . BACK SURGERY    . CATARACT EXTRACTION Left   . OVARY SURGERY    . PARATHYROIDECTOMY  9/  . TRABECULECTOMY Left     Allergies  Allergen Reactions  . Hctz [Hydrochlorothiazide]   . Other Other (See Comments)    Caused sodium to drop    Outpatient Encounter Medications as of 05/04/2020  Medication Sig  . brimonidine (ALPHAGAN) 0.2 % ophthalmic solution Place 1 drop into the right eye 3 (three) times daily. Wait 10 minutes between drops. (Keep eyes closed for 2 minutes after each drop)  . Cholecalciferol (VITAMIN D3) 50 MCG (2000 UT) TABS Take by mouth.  . diclofenac Sodium (VOLTAREN) 1 % GEL Apply 2 g topically 2 (two) times daily.  Marland Kitchen donepezil (ARICEPT) 10 MG tablet Take 10 mg by mouth at bedtime.  . dorzolamide-timolol (COSOPT) 22.3-6.8 MG/ML ophthalmic solution Place 1 drop into the right eye 2 (two) times daily. Wait 10 minutes between drops. (Keep eyes closed for 2 minutes after each drop)  . lamoTRIgine (LAMICTAL) 100  MG tablet Take 1 tablet in morning, 1 and 1/2 tablets at night  . LUMIGAN 0.01 % SOLN Place 1 drop into the right eye at bedtime. Wait 10 minutes between drops. (Keep eyes closed for 2 minutes after each drop)  . Multiple Vitamin (MULTIVITAMIN) tablet Take 1 tablet by mouth daily.  Marland Kitchen olmesartan (BENICAR) 20 MG tablet Take 20 mg by mouth daily.  . pilocarpine (PILOCAR) 4 % ophthalmic solution Place 1 drop into the right eye 4 (four) times daily. Wait 10 minutes between drops. (Keep eyes closed for 2 minutes after each drop)  . Polyethyl Glycol-Propyl Glycol (SYSTANE) 0.4-0.3 % SOLN Place 1 drop into the left eye as needed. Wait 10 minutes between drops. (Keep eyes closed for 2 minutes after each drop)  . [DISCONTINUED]  Cholecalciferol (VITAMIN D) 2000 UNITS CAPS Take 2,000 Units by mouth daily.   No facility-administered encounter medications on file as of 05/04/2020.    Review of Systems:  Review of Systems  Constitutional: Negative for chills, fever and malaise/fatigue.  HENT: Negative for congestion.   Eyes: Negative for blurred vision.  Respiratory: Negative for cough and shortness of breath.   Cardiovascular: Negative for chest pain, palpitations and leg swelling.  Gastrointestinal: Negative for abdominal pain, blood in stool, constipation and melena.  Genitourinary: Negative for dysuria.  Musculoskeletal: Negative for falls and joint pain.  Skin: Negative for itching and rash.  Neurological: Negative for dizziness and loss of consciousness.  Endo/Heme/Allergies: Bruises/bleeds easily.  Psychiatric/Behavioral: Positive for memory loss. Negative for depression. The patient is not nervous/anxious.     Health Maintenance  Topic Date Due  . DEXA SCAN  Never done  . INFLUENZA VACCINE  07/03/2020  . TETANUS/TDAP  11/07/2022  . COVID-19 Vaccine  Completed  . PNA vac Low Risk Adult  Completed    Physical Exam: Vitals:   05/04/20 1106  BP: 128/76  Pulse: 76  Temp: 97.8 F (36.6 C)  SpO2: 96%  Weight: 125 lb (56.7 kg)   Body mass index is 20.8 kg/m. Physical Exam Vitals reviewed.  Constitutional:      Appearance: Normal appearance.  HENT:     Head: Normocephalic and atraumatic.  Cardiovascular:     Rate and Rhythm: Normal rate and regular rhythm.     Pulses: Normal pulses.     Heart sounds: Normal heart sounds.  Pulmonary:     Effort: Pulmonary effort is normal.     Breath sounds: Normal breath sounds.  Abdominal:     General: Bowel sounds are normal.     Palpations: Abdomen is soft.  Musculoskeletal:        General: Normal range of motion.     Right lower leg: No edema.     Left lower leg: No edema.  Skin:    General: Skin is warm and dry.     Coloration: Skin is pale.    Neurological:     General: No focal deficit present.     Mental Status: She is alert. Mental status is at baseline.     Motor: No weakness.     Gait: Gait normal.  Psychiatric:        Mood and Affect: Mood normal.        Behavior: Behavior normal.     Labs reviewed: Basic Metabolic Panel: Recent Labs    06/25/19 1553 09/10/19 0000  NA 138 137  K 4.3 4.0  CL 103  --   CO2 21  --   GLUCOSE 92  --  BUN 17 15  CREATININE 1.12* 1.0  CALCIUM 9.1  --   TSH  --  1.76   Liver Function Tests: Recent Labs    06/25/19 1553 09/10/19 0000  AST 23 17  ALT 14 9  ALKPHOS 69 72  BILITOT 0.2  --   PROT 6.2  --   ALBUMIN 4.4  --    No results for input(s): LIPASE, AMYLASE in the last 8760 hours. No results for input(s): AMMONIA in the last 8760 hours. CBC: Recent Labs    06/25/19 1553 09/10/19 0000  WBC 4.7 5.3  NEUTROABS 2.9  --   HGB 12.2 12.7  HCT 35.7 34*  MCV 98*  --   PLT 168 163   Lipid Panel: Recent Labs    09/10/19 0000  CHOL 241*  HDL 99*  LDLCALC 128  TRIG 72   No results found for: HGBA1C  Assessment/Plan 1. DNR (do not resuscitate) - Do not attempt resuscitation (DNR)  2. Pseudogout of right wrist -completed treatment and recovered  3. Mixed Alzheimer's and vascular dementia ([place]) -doing well in AL, happy and spunky  4. Senile osteoporosis -due for bone density recheck  5. Chronic obstructive pulmonary disease, unspecified COPD type ([place]) -breathing well and no c/o wheezing or SOB  6. Primary osteoarthritis involving multiple joints -cont voltaren gel   Labs/tests ordered:  Check cbc, cmp before next visit Next appt:  6 mos med mgt  Mikhael Hendriks L. Marguetta Windish, D.O. [facility] [address], [place] [address] Cell Phone (Mon-Fri 8am-5pm):  [phone] On Call:  [phone] & follow prompts after 5pm & weekends Office Phone:  [phone] Office Fax:  [phone]

## 2020-05-18 ENCOUNTER — Ambulatory Visit: Payer: Medicare Other | Admitting: Neurology

## 2020-06-08 ENCOUNTER — Non-Acute Institutional Stay: Payer: Medicare Other | Admitting: Internal Medicine

## 2020-06-08 DIAGNOSIS — H0014 Chalazion left upper eyelid: Secondary | ICD-10-CM

## 2020-06-08 DIAGNOSIS — G309 Alzheimer's disease, unspecified: Secondary | ICD-10-CM | POA: Diagnosis not present

## 2020-06-08 DIAGNOSIS — H409 Unspecified glaucoma: Secondary | ICD-10-CM | POA: Diagnosis not present

## 2020-06-08 DIAGNOSIS — F0150 Vascular dementia without behavioral disturbance: Secondary | ICD-10-CM | POA: Diagnosis not present

## 2020-06-08 DIAGNOSIS — F0280 Dementia in other diseases classified elsewhere without behavioral disturbance: Secondary | ICD-10-CM

## 2020-06-08 NOTE — Progress Notes (Signed)
Location:  [place] Room Number: 856 Place of Service:  ALF (13) Provider:  Rodrigo Mcgranahan L. Mariea Clonts, D.O., C.M.D.  Gayland Curry, DO  Patient Care Team: Gayland Curry, DO as PCP - General (Geriatric Medicine) [name], MD as Consulting Physician (Neurology)  Extended Emergency Contact Information Primary Emergency Contact: [place] Phone: [phone] Mobile Phone: [phone] Relation: Relative  Code Status:  DNR Goals of care: Advanced Directive information Advanced Directives 05/04/2020  Does Patient Have a Medical Advance Directive? Yes  Type of Paramedic of [place];Out of facility DNR (pink MOST or yellow form)  Does patient want to make changes to medical advance directive? No - Patient declined  Copy of [place] in Chart? Yes - validated most recent copy scanned in chart (See row information)  Pre-existing out of facility DNR order (yellow form or pink MOST form) Pink MOST/Yellow Form most recent copy in chart - Physician notified to receive inpatient order     Chief Complaint  Patient presents with  . Acute Visit    left upper eyelid erythematous, warm, tender    HPI:  Pt is a 86 y.o. female seen today for an acute visit for left upper lid erythema, warmth, swelling.  Pt has been getting warm compresses and it seemed to get better but now worse.  No effect on vision, no redness of eye itself.  Has minimal vision in right eye so does not want anything to happen to the left.  Nursing notes that on 7/4, a white pus lesion did burst on the upper lid and it became flat after that.  Swelling appears to be returning in that area now so they asked me to check on her.  Past Medical History:  Diagnosis Date  . Arthritis    Per [place] new patient packet   . Epilepsy ([place])    Per [place] new patient packet   . Glaucoma   . Hearing deficit    Hearing aids  . High blood pressure     Per [facility] new patient packet   . History of abnormal weight loss   . Low back pain   . Memory difficulty 05/09/2016  . MGUS (monoclonal gammopathy of unknown significance)   . Neuropathy associated with MGUS ([place]) 11/09/2015  . Radiculopathy of sacral and sacrococcygeal region    S1  . Seizures ([place])    Past Surgical History:  Procedure Laterality Date  . APPENDECTOMY    . BACK SURGERY    . CATARACT EXTRACTION Left   . OVARY SURGERY    . PARATHYROIDECTOMY  9/  . TRABECULECTOMY Left     Allergies  Allergen Reactions  . Hctz [Hydrochlorothiazide]   . Other Other (See Comments)    Caused sodium to drop    Outpatient Encounter Medications as of 06/08/2020  Medication Sig  . brimonidine (ALPHAGAN) 0.2 % ophthalmic solution Place 1 drop into the right eye 3 (three) times daily. Wait 10 minutes between drops. (Keep eyes closed for 2 minutes after each drop)  . Cholecalciferol (VITAMIN D3) 50 MCG (2000 UT) TABS Take by mouth.  . diclofenac Sodium (VOLTAREN) 1 % GEL Apply 2 g topically 2 (two) times daily.  Marland Kitchen donepezil (ARICEPT) 10 MG tablet Take 10 mg by mouth at bedtime.  . dorzolamide-timolol (COSOPT) 22.3-6.8 MG/ML ophthalmic solution Place 1 drop into the right eye 2 (two) times daily. Wait 10 minutes between drops. (Keep eyes closed for 2 minutes after  each drop)  . lamoTRIgine (LAMICTAL) 100 MG tablet Take 1 tablet in morning, 1 and 1/2 tablets at night  . LUMIGAN 0.01 % SOLN Place 1 drop into the right eye at bedtime. Wait 10 minutes between drops. (Keep eyes closed for 2 minutes after each drop)  . Multiple Vitamin (MULTIVITAMIN) tablet Take 1 tablet by mouth daily.  Marland Kitchen olmesartan (BENICAR) 20 MG tablet Take 20 mg by mouth daily.  . pilocarpine (PILOCAR) 4 % ophthalmic solution Place 1 drop into the right eye 4 (four) times daily. Wait 10 minutes between drops. (Keep eyes closed for 2 minutes after each drop)  . Polyethyl Glycol-Propyl Glycol (SYSTANE) 0.4-0.3 % SOLN Place 1 drop into  the left eye as needed. Wait 10 minutes between drops. (Keep eyes closed for 2 minutes after each drop)   No facility-administered encounter medications on file as of 06/08/2020.    Review of Systems  Constitutional: Negative for chills, fever and malaise/fatigue.  Eyes:       Right vision poor, left not affected by above  Respiratory: Negative for shortness of breath.   Cardiovascular: Negative for chest pain.  Gastrointestinal: Negative for abdominal pain.  Genitourinary: Negative for dysuria.  Musculoskeletal: Negative for falls and joint pain.       Wrist better  Neurological: Negative for dizziness and loss of consciousness.  Endo/Heme/Allergies: Bruises/bleeds easily.  Psychiatric/Behavioral: Positive for memory loss.    Immunization History  Administered Date(s) Administered  . Influenza, High Dose Seasonal PF 09/26/2015, 09/03/2017, 08/28/2018  . Influenza,inj,Quad PF,6+ Mos 10/01/2019  . Influenza-Unspecified 09/22/2009, 08/27/2011, 10/08/2012, 12/08/2014, 02/10/2018  . Moderna SARS-COVID-2 Vaccination 12/14/2019, 01/12/2020  . Pneumococcal Conjugate-13 12/31/2013  . Pneumococcal Polysaccharide-23 12/04/1999, 02/02/2016  . Td 12/04/2000  . Tdap 11/07/2012   Pertinent  Health Maintenance Due  Topic Date Due  . DEXA SCAN  Never done  . INFLUENZA VACCINE  07/03/2020  . PNA vac Low Risk Adult  Completed   Fall Risk  05/04/2020 01/13/2020 12/01/2019  Falls in the past year? 0 0 0  Number falls in past yr: 0 0 0  Injury with Fall? 0 0 0  Follow up - - Falls evaluation completed   Functional Status Survey:    Vitals:   06/08/20 0656  BP: 136/76  Pulse: 70  Resp: 18  Temp: (!) 97.3 F (36.3 C)  SpO2: 95%  Weight: 125 lb 3.2 oz (56.8 kg)  Height: 5\' 5"  (1.651 m)   Body mass index is 20.83 kg/m. Physical Exam Constitutional:      General: She is not in acute distress.    Appearance: Normal appearance. She is not toxic-appearing.  HENT:     Head: Normocephalic  and atraumatic.  Eyes:     Extraocular Movements: Extraocular movements intact.     Pupils: Pupils are equal, round, and reactive to light.     Comments: Left upper lid with area of swelling slightly medial to midline of eye; tender, erythematous upper lid  Cardiovascular:     Rate and Rhythm: Normal rate and regular rhythm.  Pulmonary:     Effort: Pulmonary effort is normal.     Breath sounds: Normal breath sounds.  Abdominal:     General: Bowel sounds are normal.  Neurological:     Mental Status: She is alert.     Labs reviewed: Recent Labs    06/25/19 1553 09/10/19 0000  NA 138 137  K 4.3 4.0  CL 103  --   CO2 21  --  GLUCOSE 92  --   BUN 17 15  CREATININE 1.12* 1.0  CALCIUM 9.1  --    Recent Labs    06/25/19 1553 09/10/19 0000  AST 23 17  ALT 14 9  ALKPHOS 69 72  BILITOT 0.2  --   PROT 6.2  --   ALBUMIN 4.4  --    Recent Labs    06/25/19 1553 09/10/19 0000  WBC 4.7 5.3  NEUTROABS 2.9  --   HGB 12.2 12.7  HCT 35.7 34*  MCV 98*  --   PLT 168 163   Lab Results  Component Value Date   TSH 1.76 09/10/2019   No results found for: HGBA1C Lab Results  Component Value Date   CHOL 241 (A) 09/10/2019   HDL 99 (A) 09/10/2019   LDLCALC 128 09/10/2019   TRIG 72 09/10/2019    Significant Diagnostic Results in last 30 days:  No results found.  Assessment/Plan 1. [place] left upper eyelid -Continue with warm compresses up to 4 times a day for 20 minutes each time -If not improving please schedule appointment with ophthalmology for incision and drainage  2. Glaucoma of right eye, unspecified glaucoma type -Resident is particularly concerned about left eye due to right eye vision being so poor  3. Mixed Alzheimer's and vascular dementia ([place]) -in AL for med administration and some assistance with daily routine -unable to give accurate history without nursing input about timeline especiallly  Family/ staff Communication: Discussed with AL  nurse  Labs/tests ordered:  No new  Hunner Garcon L. Dior Stepter, D.O. [facility] [address], [place] [address] Cell Phone (Mon-Fri 8am-5pm):  [phone] On Call:  [phone] & follow prompts after 5pm & weekends Office Phone:  [phone] Office Fax:  [phone]

## 2020-06-09 DIAGNOSIS — H0014 Chalazion left upper eyelid: Secondary | ICD-10-CM | POA: Diagnosis not present

## 2020-06-15 ENCOUNTER — Encounter: Payer: Self-pay | Admitting: Internal Medicine

## 2020-06-21 DIAGNOSIS — L84 Corns and callosities: Secondary | ICD-10-CM | POA: Diagnosis not present

## 2020-06-21 DIAGNOSIS — M79672 Pain in left foot: Secondary | ICD-10-CM | POA: Diagnosis not present

## 2020-06-21 DIAGNOSIS — M79671 Pain in right foot: Secondary | ICD-10-CM | POA: Diagnosis not present

## 2020-06-21 DIAGNOSIS — B351 Tinea unguium: Secondary | ICD-10-CM | POA: Diagnosis not present

## 2020-06-21 DIAGNOSIS — Q6689 Other  specified congenital deformities of feet: Secondary | ICD-10-CM | POA: Diagnosis not present

## 2020-07-14 DIAGNOSIS — H401133 Primary open-angle glaucoma, bilateral, severe stage: Secondary | ICD-10-CM | POA: Diagnosis not present

## 2020-07-14 DIAGNOSIS — H5202 Hypermetropia, left eye: Secondary | ICD-10-CM | POA: Diagnosis not present

## 2020-07-14 DIAGNOSIS — H26491 Other secondary cataract, right eye: Secondary | ICD-10-CM | POA: Diagnosis not present

## 2020-07-20 ENCOUNTER — Other Ambulatory Visit: Payer: Medicare Other

## 2020-07-25 DIAGNOSIS — Z9189 Other specified personal risk factors, not elsewhere classified: Secondary | ICD-10-CM | POA: Diagnosis not present

## 2020-10-14 DIAGNOSIS — H401133 Primary open-angle glaucoma, bilateral, severe stage: Secondary | ICD-10-CM | POA: Diagnosis not present

## 2020-11-07 DIAGNOSIS — I10 Essential (primary) hypertension: Secondary | ICD-10-CM | POA: Diagnosis not present

## 2020-11-07 LAB — BASIC METABOLIC PANEL
BUN: 16 (ref 4–21)
CO2: 23 — AB (ref 13–22)
Chloride: 106 (ref 99–108)
Creatinine: 0.9 (ref 0.5–1.1)
Glucose: 93
Potassium: 4.4 (ref 3.4–5.3)
Sodium: 140 (ref 137–147)

## 2020-11-07 LAB — COMPREHENSIVE METABOLIC PANEL
Albumin: 4 (ref 3.5–5.0)
Calcium: 9 (ref 8.7–10.7)
Globulin: 2.2

## 2020-11-07 LAB — CBC AND DIFFERENTIAL
HCT: 34 — AB (ref 36–46)
Hemoglobin: 12.6 (ref 12.0–16.0)
Platelets: 184 (ref 150–399)
WBC: 4.5

## 2020-11-07 LAB — CBC: RBC: 3.34 — AB (ref 3.87–5.11)

## 2020-11-09 ENCOUNTER — Encounter: Payer: Self-pay | Admitting: Internal Medicine

## 2020-11-09 ENCOUNTER — Non-Acute Institutional Stay: Payer: Medicare Other | Admitting: Internal Medicine

## 2020-11-09 ENCOUNTER — Other Ambulatory Visit: Payer: Self-pay

## 2020-11-09 VITALS — BP 118/62 | HR 77 | Temp 97.5°F | Ht 65.0 in | Wt 124.4 lb

## 2020-11-09 DIAGNOSIS — G40309 Generalized idiopathic epilepsy and epileptic syndromes, not intractable, without status epilepticus: Secondary | ICD-10-CM | POA: Diagnosis not present

## 2020-11-09 DIAGNOSIS — G309 Alzheimer's disease, unspecified: Secondary | ICD-10-CM | POA: Diagnosis not present

## 2020-11-09 DIAGNOSIS — F0280 Dementia in other diseases classified elsewhere without behavioral disturbance: Secondary | ICD-10-CM

## 2020-11-09 DIAGNOSIS — M159 Polyosteoarthritis, unspecified: Secondary | ICD-10-CM

## 2020-11-09 DIAGNOSIS — J449 Chronic obstructive pulmonary disease, unspecified: Secondary | ICD-10-CM

## 2020-11-09 DIAGNOSIS — M8949 Other hypertrophic osteoarthropathy, multiple sites: Secondary | ICD-10-CM | POA: Diagnosis not present

## 2020-11-09 DIAGNOSIS — F0150 Vascular dementia without behavioral disturbance: Secondary | ICD-10-CM

## 2020-11-09 NOTE — Progress Notes (Signed)
Location:   [place] of Service:  Clinic (12)  Provider: Emmanual Gauthreaux L. Mariea Clonts, D.O., C.M.D.  Code Status: DNR Goals of Care:  Advanced Directives 11/09/2020  Does Patient Have a Medical Advance Directive? Yes  Type of Paramedic of [place];Out of facility DNR (pink MOST or yellow form)  Does patient want to make changes to medical advance directive? No - Patient declined  Copy of [place] in Chart? Yes - validated most recent copy scanned in chart (See row information)  Pre-existing out of facility DNR order (yellow form or pink MOST form) Pink MOST/Yellow Form most recent copy in chart - Physician notified to receive inpatient order     Chief Complaint  Patient presents with  . Medical Management of Chronic Issues    6 month follow up  . Health Maintenance    dexa scan    HPI: Patient is a 86 y.o. female seen today for medical management of chronic diseases.    She is doing fine.  Clearly her short-term memory has declined further recently--she's repeating herself after just a couple of minutes time.    She has a bone density scheduled for 11/29/20.    Her shoes have the soles peeling up in the front and she's aware and needs new ones.  I asked if she could wear different shoes b/c of fall risk but she says the others are "dressier" than these.    Past Medical History:  Diagnosis Date  . Arthritis    Per [place] new patient packet   . Epilepsy ([place])    Per [place] new patient packet   . Glaucoma   . Hearing deficit    Hearing aids  . High blood pressure    Per [facility] new patient packet   . History of abnormal weight loss   . Low back pain   . Memory difficulty 05/09/2016  . MGUS (monoclonal gammopathy of unknown significance)   . Neuropathy associated with MGUS ([place]) 11/09/2015  . Radiculopathy of sacral and sacrococcygeal region    S1  . Seizures ([place])     Past Surgical History:  Procedure Laterality Date  .  APPENDECTOMY    . BACK SURGERY    . CATARACT EXTRACTION Left   . OVARY SURGERY    . PARATHYROIDECTOMY  9/  . TRABECULECTOMY Left     Allergies  Allergen Reactions  . Hctz [Hydrochlorothiazide]   . Other Other (See Comments)    Caused sodium to drop    Outpatient Encounter Medications as of 11/09/2020  Medication Sig  . acetaminophen (TYLENOL) 325 MG tablet Take 650 mg by mouth every 8 (eight) hours as needed.  . brimonidine (ALPHAGAN) 0.2 % ophthalmic solution Place 1 drop into the right eye 3 (three) times daily. Wait 10 minutes between drops. (Keep eyes closed for 2 minutes after each drop)  . chlorhexidine (PERIDEX) 0.12 % solution Use as directed 15 mLs in the mouth or throat 2 (two) times daily.  . Cholecalciferol (VITAMIN D3) 50 MCG (2000 UT) TABS Take by mouth.  . diclofenac Sodium (VOLTAREN) 1 % GEL Apply 2 g topically 2 (two) times daily.  Marland Kitchen donepezil (ARICEPT) 10 MG tablet Take 10 mg by mouth at bedtime.  . dorzolamide-timolol (COSOPT) 22.3-6.8 MG/ML ophthalmic solution Place 1 drop into the right eye 2 (two) times daily. Wait 10 minutes between drops. (Keep eyes closed for 2 minutes after each drop)  . lamoTRIgine (LAMICTAL) 100 MG tablet  Take 1 tablet in morning, 1 and 1/2 tablets at night  . LUMIGAN 0.01 % SOLN Place 1 drop into the right eye at bedtime. Wait 10 minutes between drops. (Keep eyes closed for 2 minutes after each drop)  . Multiple Vitamin (MULTIVITAMIN) tablet Take 1 tablet by mouth daily.  Marland Kitchen olmesartan (BENICAR) 20 MG tablet Take 20 mg by mouth daily.  . pilocarpine (PILOCAR) 4 % ophthalmic solution Place 1 drop into the right eye 4 (four) times daily. Wait 10 minutes between drops. (Keep eyes closed for 2 minutes after each drop)  . Polyethyl Glycol-Propyl Glycol (SYSTANE) 0.4-0.3 % SOLN Place 1 drop into the left eye as needed. Wait 10 minutes between drops. (Keep eyes closed for 2 minutes after each drop)   No facility-administered encounter medications on  file as of 11/09/2020.    Review of Systems:  Review of Systems  Constitutional: Negative for chills, fever and malaise/fatigue.  HENT: Negative for congestion and sore throat.   Eyes: Negative for blurred vision.  Respiratory: Negative for cough and shortness of breath.   Cardiovascular: Negative for chest pain, palpitations and leg swelling.  Gastrointestinal: Negative for abdominal pain, blood in stool, constipation, diarrhea and melena.  Genitourinary: Negative for dysuria.  Musculoskeletal: Negative for falls and joint pain.       Says has foot pain (has had since admission)  Skin: Negative for itching and rash.  Neurological: Negative for dizziness and loss of consciousness.  Endo/Heme/Allergies: Negative for polydipsia.  Psychiatric/Behavioral: Positive for memory loss. Negative for depression. The patient is not nervous/anxious and does not have insomnia.     Health Maintenance  Topic Date Due  . DEXA SCAN  Never done  . TETANUS/TDAP  11/07/2022  . INFLUENZA VACCINE  Completed  . COVID-19 Vaccine  Completed  . PNA vac Low Risk Adult  Completed    Physical Exam: Vitals:   11/09/20 1006  BP: 118/62  Pulse: 77  Temp: (!) 97.5 F (36.4 C)  TempSrc: Temporal  SpO2: 97%  Weight: 124 lb 6.4 oz (56.4 kg)  Height: 5\' 5"  (1.651 m)   Body mass index is 20.7 kg/m. Physical Exam Vitals reviewed.  Constitutional:      General: She is not in acute distress.    Appearance: Normal appearance. She is not toxic-appearing.  HENT:     Head: Normocephalic and atraumatic.  Eyes:     Conjunctiva/sclera: Conjunctivae normal.     Pupils: Pupils are equal, round, and reactive to light.     Comments: Glasses (were very dirty and I attempted to clean them for her)  Cardiovascular:     Rate and Rhythm: Normal rate and regular rhythm.     Pulses: Normal pulses.     Heart sounds: Normal heart sounds.  Pulmonary:     Effort: Pulmonary effort is normal.     Breath sounds: Normal  breath sounds. No wheezing.  Abdominal:     General: Bowel sounds are normal.  Musculoskeletal:        General: Normal range of motion.     Right lower leg: No edema.     Left lower leg: No edema.     Comments: Soles of shoes torn up in front  Skin:    General: Skin is warm and dry.  Neurological:     Mental Status: She is alert.     Motor: No weakness.     Gait: Gait normal.     Comments: Short-term memory loss has progressed  Psychiatric:        Mood and Affect: Mood normal.     Labs reviewed: Basic Metabolic Panel: Recent Labs    11/07/20 0000  NA 140  K 4.4  CL 106  CO2 23*  BUN 16  CREATININE 0.9  CALCIUM 9.0   Liver Function Tests: Recent Labs    11/07/20 0000  ALBUMIN 4.0   No results for input(s): LIPASE, AMYLASE in the last 8760 hours. No results for input(s): AMMONIA in the last 8760 hours. CBC: Recent Labs    11/07/20 0000  WBC 4.5  HGB 12.6  HCT 34*  PLT 184   Assessment/Plan 1. Mixed Alzheimer's and vascular dementia ([place]) -progressing unfortunately--notably worse short-term memory with more frequent repetition  2. Chronic obstructive pulmonary disease, unspecified COPD type ([place]) -no sob or wheezing, not on any inhalers  3. Primary osteoarthritis involving multiple joints -only c/o some foot pain, otherwise no pain concerns -has voltaren gel and tylenol if needed  4. Generalized convulsive epilepsy without intractable epilepsy ([place]) -continue lamictal therapy  Labs/tests ordered:  No new added Next appt: 6 mos med mgt  Landrie Beale L. Bridgett Hattabaugh, D.O. [facility] [address], [place] [address] Cell Phone (Mon-Fri 8am-5pm):  [phone] On Call:  [phone] & follow prompts after 5pm & weekends Office Phone:  [phone] Office Fax:  [phone]

## 2020-11-29 ENCOUNTER — Ambulatory Visit
Admission: RE | Admit: 2020-11-29 | Discharge: 2020-11-29 | Disposition: A | Discharge status: Home / Self Care | Payer: Medicare Other | Source: Ambulatory Visit | Attending: Internal Medicine | Admitting: Internal Medicine

## 2020-11-29 ENCOUNTER — Other Ambulatory Visit: Payer: Self-pay

## 2020-11-29 DIAGNOSIS — M8580 Other specified disorders of bone density and structure, unspecified site: Secondary | ICD-10-CM

## 2020-11-29 DIAGNOSIS — M810 Age-related osteoporosis without current pathological fracture: Secondary | ICD-10-CM | POA: Diagnosis not present

## 2020-11-30 NOTE — Progress Notes (Signed)
Bone density of wrist especially is VERY low with T score -5.2.  She is 51 but remains quite mobile in assisted living and is high risk for a fall. I wonder if her family would be interested in her taking medication for her thin bones at this point?   Please print this out and give to AL nurse manager to share with family.

## 2020-12-21 DIAGNOSIS — L814 Other melanin hyperpigmentation: Secondary | ICD-10-CM | POA: Diagnosis not present

## 2020-12-21 DIAGNOSIS — D229 Melanocytic nevi, unspecified: Secondary | ICD-10-CM | POA: Diagnosis not present

## 2020-12-21 DIAGNOSIS — L820 Inflamed seborrheic keratosis: Secondary | ICD-10-CM | POA: Diagnosis not present

## 2020-12-21 DIAGNOSIS — L821 Other seborrheic keratosis: Secondary | ICD-10-CM | POA: Diagnosis not present

## 2020-12-21 DIAGNOSIS — D1800 Hemangioma unspecified site: Secondary | ICD-10-CM | POA: Diagnosis not present

## 2020-12-21 DIAGNOSIS — L570 Actinic keratosis: Secondary | ICD-10-CM | POA: Diagnosis not present

## 2021-01-18 DIAGNOSIS — L820 Inflamed seborrheic keratosis: Secondary | ICD-10-CM | POA: Diagnosis not present

## 2021-01-18 DIAGNOSIS — L570 Actinic keratosis: Secondary | ICD-10-CM | POA: Diagnosis not present

## 2021-01-18 DIAGNOSIS — L814 Other melanin hyperpigmentation: Secondary | ICD-10-CM | POA: Diagnosis not present

## 2021-01-20 DIAGNOSIS — H401133 Primary open-angle glaucoma, bilateral, severe stage: Secondary | ICD-10-CM | POA: Diagnosis not present

## 2021-01-23 ENCOUNTER — Encounter: Payer: Self-pay | Admitting: Internal Medicine

## 2021-02-06 ENCOUNTER — Telehealth: Payer: Self-pay | Admitting: Nurse Practitioner

## 2021-02-06 NOTE — Telephone Encounter (Signed)
AWV - Left vm for son to call and schedule.

## 2021-02-10 ENCOUNTER — Telehealth: Payer: Self-pay

## 2021-02-10 ENCOUNTER — Other Ambulatory Visit: Payer: Self-pay

## 2021-02-10 ENCOUNTER — Ambulatory Visit (INDEPENDENT_AMBULATORY_CARE_PROVIDER_SITE_OTHER): Payer: Medicare Other | Admitting: Nurse Practitioner

## 2021-02-10 DIAGNOSIS — Z0000 Encounter for general adult medical examination without abnormal findings: Secondary | ICD-10-CM | POA: Diagnosis not present

## 2021-02-10 NOTE — Patient Instructions (Signed)
Tracy Nielsen , Thank you for taking time to come for your Medicare Wellness Visit. I appreciate your ongoing commitment to your health goals. Please review the following plan we discussed and let me know if I can assist you in the future.   Screening recommendations/referrals: Colonoscopy aged out Mammogram age out Bone Density aged out Recommended yearly ophthalmology/optometry visit for glaucoma screening and checkup Recommended yearly dental visit for hygiene and checkup  Vaccinations: Influenza vaccine up to date Pneumococcal vaccine up to date Tdap vaccine up to date Shingles vaccine - recommend [facility]   Advanced directives: on file.   Conditions/risks identified: advanced age, fall risk.  Next appointment: yearly for AWV   Preventive Care 22 Years and Older, Female Preventive care refers to lifestyle choices and visits with your health care provider that can promote health and wellness. What does preventive care include?  A yearly physical exam. This is also called an annual well check.  Dental exams once or twice a year.  Routine eye exams. Ask your health care provider how often you should have your eyes checked.  Personal lifestyle choices, including:  Daily care of your teeth and gums.  Regular physical activity.  Eating a healthy diet.  Avoiding tobacco and drug use.  Limiting alcohol use.  Practicing safe sex.  Taking low-dose aspirin every day.  Taking vitamin and mineral supplements as recommended by your health care provider. What happens during an annual well check? The services and screenings done by your health care provider during your annual well check will depend on your age, overall health, lifestyle risk factors, and family history of disease. Counseling  Your health care provider may ask you questions about your:  Alcohol use.  Tobacco use.  Drug use.  Emotional well-being.  Home and relationship well-being.  Sexual  activity.  Eating habits.  History of falls.  Memory and ability to understand (cognition).  Work and work Statistician.  Reproductive health. Screening  You may have the following tests or measurements:  Height, weight, and BMI.  Blood pressure.  Lipid and cholesterol levels. These may be checked every 5 years, or more frequently if you are over 58 years old.  Skin check.  Lung cancer screening. You may have this screening every year starting at age 41 if you have a 30-pack-year history of smoking and currently smoke or have quit within the past 15 years.  Fecal occult blood test (FOBT) of the stool. You may have this test every year starting at age 61.  Flexible sigmoidoscopy or colonoscopy. You may have a sigmoidoscopy every 5 years or a colonoscopy every 10 years starting at age 60.  Hepatitis C blood test.  Hepatitis B blood test.  Sexually transmitted disease (STD) testing.  Diabetes screening. This is done by checking your blood sugar (glucose) after you have not eaten for a while (fasting). You may have this done every 1-3 years.  Bone density scan. This is done to screen for osteoporosis. You may have this done starting at age 37.  Mammogram. This may be done every 1-2 years. Talk to your health care provider about how often you should have regular mammograms. Talk with your health care provider about your test results, treatment options, and if necessary, the need for more tests. Vaccines  Your health care provider may recommend certain vaccines, such as:  Influenza vaccine. This is recommended every year.  Tetanus, diphtheria, and acellular pertussis (Tdap, Td) vaccine. You may need a Td booster every 10 years.  Zoster vaccine. You may need this after age 64.  Pneumococcal 13-valent conjugate (PCV13) vaccine. One dose is recommended after age 59.  Pneumococcal polysaccharide (PPSV23) vaccine. One dose is recommended after age 55. Talk to your health care  provider about which screenings and vaccines you need and how often you need them. This information is not intended to replace advice given to you by your health care provider. Make sure you discuss any questions you have with your health care provider. Document Released: 12/16/2015 Document Revised: 08/08/2016 Document Reviewed: 09/20/2015 Elsevier Interactive Patient Education  2017 [place] Prevention in the Home Falls can cause injuries. They can happen to people of all ages. There are many things you can do to make your home safe and to help prevent falls. What can I do on the outside of my home?  Regularly fix the edges of walkways and driveways and fix any cracks.  Remove anything that might make you trip as you walk through a door, such as a raised step or threshold.  Trim any bushes or trees on the path to your home.  Use bright outdoor lighting.  Clear any walking paths of anything that might make someone trip, such as rocks or tools.  Regularly check to see if handrails are loose or broken. Make sure that both sides of any steps have handrails.  Any raised decks and porches should have guardrails on the edges.  Have any leaves, snow, or ice cleared regularly.  Use sand or salt on walking paths during winter.  Clean up any spills in your garage right away. This includes oil or grease spills. What can I do in the bathroom?  Use night lights.  Install grab bars by the toilet and in the tub and shower. Do not use towel bars as grab bars.  Use non-skid mats or decals in the tub or shower.  If you need to sit down in the shower, use a plastic, non-slip stool.  Keep the floor dry. Clean up any water that spills on the floor as soon as it happens.  Remove soap buildup in the tub or shower regularly.  Attach bath mats securely with double-sided non-slip rug tape.  Do not have throw rugs and other things on the floor that can make you trip. What can I do in  the bedroom?  Use night lights.  Make sure that you have a light by your bed that is easy to reach.  Do not use any sheets or blankets that are too big for your bed. They should not hang down onto the floor.  Have a firm chair that has side arms. You can use this for support while you get dressed.  Do not have throw rugs and other things on the floor that can make you trip. What can I do in the kitchen?  Clean up any spills right away.  Avoid walking on wet floors.  Keep items that you use a lot in easy-to-reach places.  If you need to reach something above you, use a strong step stool that has a grab bar.  Keep electrical cords out of the way.  Do not use floor polish or wax that makes floors slippery. If you must use wax, use non-skid floor wax.  Do not have throw rugs and other things on the floor that can make you trip. What can I do with my stairs?  Do not leave any items on the stairs.  Make sure that there are  handrails on both sides of the stairs and use them. Fix handrails that are broken or loose. Make sure that handrails are as long as the stairways.  Check any carpeting to make sure that it is firmly attached to the stairs. Fix any carpet that is loose or worn.  Avoid having throw rugs at the top or bottom of the stairs. If you do have throw rugs, attach them to the floor with carpet tape.  Make sure that you have a light switch at the top of the stairs and the bottom of the stairs. If you do not have them, ask someone to add them for you. What else can I do to help prevent falls?  Wear shoes that:  Do not have high heels.  Have rubber bottoms.  Are comfortable and fit you well.  Are closed at the toe. Do not wear sandals.  If you use a stepladder:  Make sure that it is fully opened. Do not climb a closed stepladder.  Make sure that both sides of the stepladder are locked into place.  Ask someone to hold it for you, if possible.  Clearly mark and  make sure that you can see:  Any grab bars or handrails.  First and last steps.  Where the edge of each step is.  Use tools that help you move around (mobility aids) if they are needed. These include:  Canes.  Walkers.  Scooters.  Crutches.  Turn on the lights when you go into a dark area. Replace any light bulbs as soon as they burn out.  Set up your furniture so you have a clear path. Avoid moving your furniture around.  If any of your floors are uneven, fix them.  If there are any pets around you, be aware of where they are.  Review your medicines with your doctor. Some medicines can make you feel dizzy. This can increase your chance of falling. Ask your doctor what other things that you can do to help prevent falls. This information is not intended to replace advice given to you by your health care provider. Make sure you discuss any questions you have with your health care provider. Document Released: 09/15/2009 Document Revised: 04/26/2016 Document Reviewed: 12/24/2014 Elsevier Interactive Patient Education  2017 [employer].

## 2021-02-10 NOTE — Telephone Encounter (Signed)
I have given Manuela Schwartz, the nurse manager for [facility], an order through the hipaa-compliant tiger text system just now.

## 2021-02-10 NOTE — Telephone Encounter (Signed)
Patient's son states he confirmed with [facility] that they can administer Shingrix but need an order sent to [facility]. To Dr. Mariea Clonts.

## 2021-02-10 NOTE — Telephone Encounter (Signed)
Ms. grayce, budden are scheduled for a virtual visit with your provider today.    Just as we do with appointments in the office, we must obtain your consent to participate.  Your consent will be active for this visit and any virtual visit you may have with one of our providers in the next 365 days.    If you have a MyChart account, I can also send a copy of this consent to you electronically.  All virtual visits are billed to your insurance company just like a traditional visit in the office.  As this is a virtual visit, video technology does not allow for your provider to perform a traditional examination.  This may limit your provider's ability to fully assess your condition.  If your provider identifies any concerns that need to be evaluated in person or the need to arrange testing such as labs, EKG, etc, we will make arrangements to do so.    Although advances in technology are sophisticated, we cannot ensure that it will always work on either your end or our end.  If the connection with a video visit is poor, we may have to switch to a telephone visit.  With either a video or telephone visit, we are not always able to ensure that we have a secure connection.   I need to obtain your verbal consent now.   Are you willing to proceed with your visit today?   Cniyah Sproull Boxwell has provided verbal consent on 02/10/2021 for a virtual visit (video or telephone).   Carroll Kinds, CMA 02/10/2021  11:16 AM

## 2021-02-10 NOTE — Progress Notes (Signed)
Subjective:   Tracy Nielsen is a 87 y.o. female who presents for Medicare Annual (Subsequent) preventive examination.  Review of Systems     Cardiac Risk Factors include: advanced age (>13men, >63 women);hypertension     Objective:    There were no vitals filed for this visit. There is no height or weight on file to calculate BMI.  Advanced Directives 02/10/2021 11/09/2020 05/04/2020 03/02/2020 01/13/2020 12/01/2019 09/09/2019  Does Patient Have a Medical Advance Directive? Yes Yes Yes Yes Yes Yes Yes  Type of Paramedic of [place];Out of facility DNR (pink MOST or yellow form) [place];Out of facility DNR (pink MOST or yellow form) [place];Out of facility DNR (pink MOST or yellow form) [place];Out of facility DNR (pink MOST or yellow form) [place];Living will;Out of facility DNR (pink MOST or yellow form) [place];Living will;Out of facility DNR (pink MOST or yellow form) Healthcare Power of Attorney  Does patient want to make changes to medical advance directive? No - Patient declined No - Patient declined No - Patient declined No - Patient declined No - Patient declined No - Patient declined No - Patient declined  Copy of [place] in Chart? Yes - validated most recent copy scanned in chart (See row information) Yes - validated most recent copy scanned in chart (See row information) Yes - validated most recent copy scanned in chart (See row information) Yes - validated most recent copy scanned in chart (See row information) - Yes - validated most recent copy scanned in chart (See row information) Yes - validated most recent copy scanned in chart (See row information)  Pre-existing out of facility DNR order (yellow form or pink MOST form) Yellow form placed in chart (order not valid for inpatient use) Pink MOST/Yellow Form most recent copy in chart -  Physician notified to receive inpatient order Pink MOST/Yellow Form most recent copy in chart - Physician notified to receive inpatient order Pink MOST/Yellow Form most recent copy in chart - Physician notified to receive inpatient order Pink MOST/Yellow Form most recent copy in chart - Physician notified to receive inpatient order - -    Current Medications (verified) Outpatient Encounter Medications as of 02/10/2021  Medication Sig  . acetaminophen (TYLENOL) 325 MG tablet Take 650 mg by mouth every 8 (eight) hours as needed.  . brimonidine (ALPHAGAN) 0.2 % ophthalmic solution Place 1 drop into the right eye 3 (three) times daily. Wait 10 minutes between drops. (Keep eyes closed for 2 minutes after each drop)  . chlorhexidine (PERIDEX) 0.12 % solution Use as directed 15 mLs in the mouth or throat 2 (two) times daily.  . Cholecalciferol (VITAMIN D3) 50 MCG (2000 UT) TABS Take by mouth.  . diclofenac Sodium (VOLTAREN) 1 % GEL Apply 2 g topically 2 (two) times daily.  Marland Kitchen donepezil (ARICEPT) 10 MG tablet Take 10 mg by mouth at bedtime.  . dorzolamide-timolol (COSOPT) 22.3-6.8 MG/ML ophthalmic solution Place 1 drop into the right eye 2 (two) times daily. Wait 10 minutes between drops. (Keep eyes closed for 2 minutes after each drop)  . lamoTRIgine (LAMICTAL) 100 MG tablet Take 1 tablet in morning, 1 and 1/2 tablets at night  . LUMIGAN 0.01 % SOLN Place 1 drop into the right eye at bedtime. Wait 10 minutes between drops. (Keep eyes closed for 2 minutes after each drop)  . Multiple Vitamin (MULTIVITAMIN) tablet Take 1 tablet by  mouth daily.  Marland Kitchen olmesartan (BENICAR) 20 MG tablet Take 20 mg by mouth daily.  . pilocarpine (PILOCAR) 4 % ophthalmic solution Place 1 drop into the right eye 4 (four) times daily. Wait 10 minutes between drops. (Keep eyes closed for 2 minutes after each drop)  . Polyethyl Glycol-Propyl Glycol 0.4-0.3 % SOLN Place 1 drop into the left eye as needed. Wait 10 minutes between drops.  (Keep eyes closed for 2 minutes after each drop)   No facility-administered encounter medications on file as of 02/10/2021.    Allergies (verified) Hctz [hydrochlorothiazide] and Other   History: Past Medical History:  Diagnosis Date  . Arthritis    Per [facility] new patient packet   . Epilepsy ([place])    Per [place] new patient packet   . Glaucoma   . Hearing deficit    Hearing aids  . High blood pressure    Per [facility] new patient packet   . History of abnormal weight loss   . Low back pain   . Memory difficulty 05/09/2016  . MGUS (monoclonal gammopathy of unknown significance)   . Neuropathy associated with MGUS ([place]) 11/09/2015  . Radiculopathy of sacral and sacrococcygeal region    S1  . Seizures ([place])    Past Surgical History:  Procedure Laterality Date  . APPENDECTOMY    . BACK SURGERY    . CATARACT EXTRACTION Left   . OVARY SURGERY    . PARATHYROIDECTOMY  9/  . TRABECULECTOMY Left    Family History  Problem Relation Age of Onset  . Congestive Heart Failure Mother   . Parkinsonism Father   . Heart attack Father   . Colon cancer Sister   . Multiple sclerosis Daughter   . Muscular dystrophy Grandchild   . Muscular dystrophy Grandchild    Social History   Socioeconomic History  . Marital status: Married    Spouse name: Not on file  . Number of children: 4  . Years of education: 46  . Highest education level: Not on file  Occupational History  . Occupation: Retired  Tobacco Use  . Smoking status: Former Smoker    Packs/day: 0.50    Years: 25.00    Pack years: 12.50    Types: Cigarettes    Quit date: 12/03/1973    Years since quitting: 47.2  . Smokeless tobacco: Never Used  Vaping Use  . Vaping Use: Never used  Substance and Sexual Activity  . Alcohol use: Yes    Comment: Consumes 2 glasses of wine per day  . Drug use: No  . Sexual activity: Not Currently  Other Topics Concern  . Not on file  Social History Narrative   Lives at home w/ her husband.   Patient  drinks about 2 cups of caffeine daily.   Patient is right handed.       Per [facility] Patient Packet, Abstracted 08/26/2019   Diet:Normal      Caffeine:Yes      Married, if yes what year: Widow 4 (div 38), remarried 1988      Do you live in a house, apartment, assisted living, condo, trailer, ect: Assisted Living ([place]), 1 stories, 1 person      Pets: No      Current/Past profession: Academic librarian, organize affairs for elderly      Exercise: Yes, walking daily       Living Will: Yes   DNR: No, would like to discuss one--did discuss on  10/9 and DNR form completed, copy made for vynca, and original placed in paper chart; HCPOA, [name], also present and agreeable with his mother-in-law decision      POA/HPOA: Yes      Functional Status:   Do you have difficulty bathing or dressing yourself? no   Do you have difficulty preparing food or eating? Prep yes, eating no   Do you have difficulty managing your medications? yes   Do you have difficulty managing your finances? SIL helps   Do you have difficulty affording your medications?  no   Social Determinants of Health   Financial Resource Strain: Not on file  Food Insecurity: Not on file  Transportation Needs: Not on file  Physical Activity: Not on file  Stress: Not on file  Social Connections: Not on file    Tobacco Counseling Counseling given: Not Answered   Clinical Intake:     Pain : No/denies pain     BMI - recorded: 20.7 Nutritional Status: BMI of 19-24  Normal Nutritional Risks: None Diabetes: No  How often do you need to have someone help you when you read instructions, pamphlets, or other written materials from your doctor or pharmacy?: 3 - Sometimes  Diabetic?no         Activities of Daily Living In your present state of health, do you have any difficulty performing the following activities: 02/10/2021  Hearing? N  Vision? N  Difficulty  concentrating or making decisions? N  Walking or climbing stairs? N  Dressing or bathing? N  Doing errands, shopping? Y  Preparing Food and eating ? N  Using the Toilet? N  In the past six months, have you accidently leaked urine? N  Do you have problems with loss of bowel control? N  Managing your Medications? N  Comment in assisting living at [place] your Finances? N  Comment family helps  Housekeeping or managing your Housekeeping? N  Comment [facility]  Some recent data might be hidden    Patient Care Team: Gayland Curry, DO as PCP - General (Geriatric Medicine) [name], MD as Consulting Physician (Neurology)  Indicate any recent Medical Services you may have received from other than [facility] providers in the past year (date may be approximate).     Assessment:   This is a routine wellness examination for [place].  Hearing/Vision screen  Hearing Screening   125Hz  250Hz  500Hz  1000Hz  2000Hz  3000Hz  4000Hz  6000Hz  8000Hz   Right ear:           Left ear:           Comments: Patient has no hearing problems.  Vision Screening Comments: Patient has glaucoma. Patient goes to [facility]. Patient doesn't see out of right eye.   Dietary issues and exercise activities discussed: Current Exercise Habits: Home exercise routine, Type of exercise: walking, Time (Minutes): 20, Frequency (Times/Week): 7, Weekly Exercise (Minutes/Week): 140  Goals    . DIET - EAT MORE FRUITS AND VEGETABLES      Depression Screen PHQ 2/9 Scores 02/10/2021 11/09/2020 05/04/2020 12/01/2019  PHQ - 2 Score 0 0 0 0    Fall Risk Fall Risk  02/10/2021 11/09/2020 05/04/2020 01/13/2020 12/01/2019  Falls in the past year? 0 0 0 0 0  Number falls in past yr: 0 0 0 0 0  Injury with Fall? 0 0 0 0 0  Follow up - - - - Falls evaluation completed    FALL RISK PREVENTION PERTAINING TO THE HOME:  Any  stairs in or around the home? No  If so, are there any without handrails? No  Home free of  loose throw rugs in walkways, pet beds, electrical cords, etc? Yes  Adequate lighting in your home to reduce risk of falls? Yes   ASSISTIVE DEVICES UTILIZED TO PREVENT FALLS:  Life alert? No  Use of a cane, walker or w/c? No  Grab bars in the bathroom? Yes  Shower chair or bench in shower? Yes  Elevated toilet seat or a handicapped toilet? No   TIMED UP AND GO:  Was the test performed? No .    Cognitive Function: MMSE - Mini Mental State Exam 12/01/2019 01/14/2018 01/09/2017 05/09/2016 11/09/2015  Orientation to time 2 4 4 4 5   Orientation to Place 5 5 5 5 5   Registration 3 3 3 3 3   Attention/ Calculation 5 5 5 5 5   Recall 0 1 1 2 2   Language- name 2 objects 2 2 2 2 2   Language- repeat 1 1 1 1 1   Language- follow 3 step command 3 3 3 3 3   Language- read & follow direction 1 1 1 1 1   Write a sentence 1 1 1 1 1   Copy design 1 1 1 1 1   Total score 24 27 27 28 29      6CIT Screen 02/10/2021  What Year? 0 points  What month? 0 points  What time? 0 points  Count back from 20 0 points  Months in reverse 4 points  Repeat phrase 10 points  Total Score 14    Immunizations Immunization History  Administered Date(s) Administered  . Influenza, High Dose Seasonal PF 09/26/2015, 09/03/2017, 08/28/2018, 09/23/2020  . Influenza,inj,Quad PF,6+ Mos 10/01/2019  . Influenza-Unspecified 09/22/2009, 08/27/2011, 10/08/2012, 12/08/2014, 02/10/2018  . Moderna Sars-Covid-2 Vaccination 12/14/2019, 01/12/2020, 09/20/2020  . Pneumococcal Conjugate-13 12/31/2013  . Pneumococcal Polysaccharide-23 12/04/1999, 02/02/2016  . Td 12/04/2000  . Tdap 11/07/2012    TDAP status: Up to date  Flu Vaccine status: Up to date  Pneumococcal vaccine status: Up to date  Covid-19 vaccine status: Completed vaccines  Qualifies for Shingles Vaccine? Yes   Zostavax completed No   Shingrix Completed?: No.    Education has been provided regarding the importance of this vaccine. Patient has been advised to call  insurance company to determine out of pocket expense if they have not yet received this vaccine. Advised may also receive vaccine at local pharmacy or Health Dept. Verbalized acceptance and understanding.  Screening Tests Health Maintenance  Topic Date Due  . TETANUS/TDAP  11/07/2022  . INFLUENZA VACCINE  Completed  . DEXA SCAN  Completed  . COVID-19 Vaccine  Completed  . PNA vac Low Risk Adult  Completed  . HPV VACCINES  Aged Out    Health Maintenance  There are no preventive care reminders to display for this patient.  Colorectal cancer screening: No longer required.   Mammogram status: No longer required due to aged out.  Bone Density status: Completed 12/21. Results reflect: Bone density results: OSTEOPOROSIS. Repeat every 2 years.  Lung Cancer Screening: (Low Dose CT Chest recommended if Age 70-80 years, 30 pack-year currently smoking OR have quit w/in 15years.) does not qualify.    Additional Screening:  Hepatitis C Screening: does not qualify  Vision Screening: Recommended annual ophthalmology exams for early detection of glaucoma and other disorders of the eye. Is the patient up to date with their annual eye exam?  Yes  Who is the provider or what is the  name of the office in which the patient attends annual eye exams? [facility] If pt is not established with a provider, would they like to be referred to a provider to establish care? No .   Dental Screening: Recommended annual dental exams for proper oral hygiene  Community Resource Referral / Chronic Care Management: CRR required this visit?  No   CCM required this visit?  No      Plan:     I have personally reviewed and noted the following in the patient's chart:   . Medical and social history . Use of alcohol, tobacco or illicit drugs  . Current medications and supplements . Functional ability and status . Nutritional status . Physical activity . Advanced directives . List of other  physicians . Hospitalizations, surgeries, and ER visits in previous 12 months . Vitals . Screenings to include cognitive, depression, and falls . Referrals and appointments  In addition, I have reviewed and discussed with patient certain preventive protocols, quality metrics, and best practice recommendations. A written personalized care plan for preventive services as well as general preventive health recommendations were provided to patient.     Lauree Chandler, NP   02/10/2021    Virtual Visit via Telephone Note  I connected with@ on 02/10/21 at 11:00 AM EST by telephone and verified that I am speaking with the correct person using two identifiers.  Location: Patient: home Provider: [facility] clinic   I discussed the limitations, risks, security and privacy concerns of performing an evaluation and management service by telephone and the availability of in person appointments. I also discussed with the patient that there may be a patient responsible charge related to this service. The patient expressed understanding and agreed to proceed.   I discussed the assessment and treatment plan with the patient. The patient was provided an opportunity to ask questions and all were answered. The patient agreed with the plan and demonstrated an understanding of the instructions.   The patient was advised to call back or seek an in-person evaluation if the symptoms worsen or if the condition fails to improve as anticipated.  I provided 18 minutes of non-face-to-face time during this encounter.  Carlos American. Harle Battiest Avs printed and mailed

## 2021-02-10 NOTE — Progress Notes (Signed)
This service is provided via telemedicine  No vital signs collected/recorded due to the encounter was a telemedicine visit.   Location of patient (ex: home, work): Home  Patient consents to a telephone visit:  Yes, see encounter dated 02/10/2021  Location of the provider (ex: office, home): [facility]  Name of any referring provider: Hollace Kinnier, DO  Names of all persons participating in the telemedicine service and their role in the encounter:  Sherrie Mustache, Nurse Practitioner, Carroll Kinds, CMA, patient and Jeneen Montgomery.  Time spent on call:  14 minutes with medical assistant

## 2021-02-15 DIAGNOSIS — L814 Other melanin hyperpigmentation: Secondary | ICD-10-CM | POA: Diagnosis not present

## 2021-02-15 DIAGNOSIS — L570 Actinic keratosis: Secondary | ICD-10-CM | POA: Diagnosis not present

## 2021-02-15 DIAGNOSIS — L821 Other seborrheic keratosis: Secondary | ICD-10-CM | POA: Diagnosis not present

## 2021-02-21 DIAGNOSIS — L84 Corns and callosities: Secondary | ICD-10-CM | POA: Diagnosis not present

## 2021-02-21 DIAGNOSIS — M79672 Pain in left foot: Secondary | ICD-10-CM | POA: Diagnosis not present

## 2021-02-21 DIAGNOSIS — M79671 Pain in right foot: Secondary | ICD-10-CM | POA: Diagnosis not present

## 2021-02-21 DIAGNOSIS — B351 Tinea unguium: Secondary | ICD-10-CM | POA: Diagnosis not present

## 2021-05-03 ENCOUNTER — Other Ambulatory Visit: Payer: Self-pay

## 2021-05-03 ENCOUNTER — Non-Acute Institutional Stay: Payer: Medicare Other | Admitting: Internal Medicine

## 2021-05-03 ENCOUNTER — Encounter: Payer: Self-pay | Admitting: Internal Medicine

## 2021-05-03 VITALS — BP 94/58 | HR 73 | Temp 96.5°F | Ht 65.0 in | Wt 125.4 lb

## 2021-05-03 DIAGNOSIS — I10 Essential (primary) hypertension: Secondary | ICD-10-CM | POA: Diagnosis not present

## 2021-05-03 DIAGNOSIS — M8949 Other hypertrophic osteoarthropathy, multiple sites: Secondary | ICD-10-CM | POA: Diagnosis not present

## 2021-05-03 DIAGNOSIS — M810 Age-related osteoporosis without current pathological fracture: Secondary | ICD-10-CM

## 2021-05-03 DIAGNOSIS — F0150 Vascular dementia without behavioral disturbance: Secondary | ICD-10-CM

## 2021-05-03 DIAGNOSIS — G309 Alzheimer's disease, unspecified: Secondary | ICD-10-CM | POA: Diagnosis not present

## 2021-05-03 DIAGNOSIS — G40309 Generalized idiopathic epilepsy and epileptic syndromes, not intractable, without status epilepticus: Secondary | ICD-10-CM

## 2021-05-03 DIAGNOSIS — F0280 Dementia in other diseases classified elsewhere without behavioral disturbance: Secondary | ICD-10-CM

## 2021-05-03 DIAGNOSIS — M159 Polyosteoarthritis, unspecified: Secondary | ICD-10-CM

## 2021-05-03 NOTE — Progress Notes (Addendum)
Location:  [place] of Service:  Clinic (12)  Provider:   Code Status:  Goals of Care:  Advanced Directives 05/03/2021  Does Patient Have a Medical Advance Directive? Yes  Type of Advance Directive [place]  Does patient want to make changes to medical advance directive? No - Patient declined  Copy of [place] in Chart? Yes - validated most recent copy scanned in chart (See row information)  Pre-existing out of facility DNR order (yellow form or pink MOST form) -     Chief Complaint  Patient presents with  . Medical Management of Chronic Issues    Patient returns to the clinic for her 6 month follow up.     HPI: Patient is a 87 y.o. female seen today for medical management of chronic diseases.    Has h/o Alzheimer Dementia, h/o Seizure disorder and Osteoarthritis and Hypertension H/o Peripheral Neuropathy  She lives in [place] are taken care of by Nurses Staying independent in her [place] with no assist  No falls. No Behavior Issues Weight stable No Nursing issues    Past Medical History:  Diagnosis Date  . Arthritis    Per [place] new patient packet   . Epilepsy ([place])    Per [place] new patient packet   . Glaucoma   . Hearing deficit    Hearing aids  . High blood pressure    Per [facility] new patient packet   . History of abnormal weight loss   . Low back pain   . Memory difficulty 05/09/2016  . MGUS (monoclonal gammopathy of unknown significance)   . Neuropathy associated with MGUS ([place]) 11/09/2015  . Radiculopathy of sacral and sacrococcygeal region    S1  . Seizures ([place])     Past Surgical History:  Procedure Laterality Date  . APPENDECTOMY    . BACK SURGERY    . CATARACT EXTRACTION Left   . OVARY SURGERY    . PARATHYROIDECTOMY  9/  . TRABECULECTOMY Left     Allergies  Allergen Reactions  . Hctz [Hydrochlorothiazide]   . Other Other (See Comments)    Caused sodium to drop     Outpatient Encounter Medications as of 05/03/2021  Medication Sig  . acetaminophen (TYLENOL) 325 MG tablet Take 650 mg by mouth every 8 (eight) hours as needed.  . brimonidine (ALPHAGAN) 0.2 % ophthalmic solution Place 1 drop into the right eye 3 (three) times daily. Wait 10 minutes between drops. (Keep eyes closed for 2 minutes after each drop)  . chlorhexidine (PERIDEX) 0.12 % solution Use as directed 15 mLs in the mouth or throat 2 (two) times daily.  . Cholecalciferol (VITAMIN D3) 50 MCG (2000 UT) TABS Take by mouth.  . diclofenac Sodium (VOLTAREN) 1 % GEL Apply 2 g topically 2 (two) times daily.  Marland Kitchen donepezil (ARICEPT) 10 MG tablet Take 10 mg by mouth at bedtime.  . dorzolamide-timolol (COSOPT) 22.3-6.8 MG/ML ophthalmic solution Place 1 drop into the right eye 2 (two) times daily. Wait 10 minutes between drops. (Keep eyes closed for 2 minutes after each drop)  . Eyelid Cleansers (OCUSOFT EYELID CLEANSING) PADS Apply topically. Massage LEFT eyelid/lash line for 3 mins BID As Needed  . lamoTRIgine (LAMICTAL) 100 MG tablet Take 1 tablet in morning, 1 and 1/2 tablets at night  . LUMIGAN 0.01 % SOLN Place 1 drop into the right eye at bedtime. Wait 10 minutes between drops. (Keep eyes closed for  2 minutes after each drop)  . Multiple Vitamin (MULTIVITAMIN) tablet Take 1 tablet by mouth daily.  Marland Kitchen olmesartan (BENICAR) 20 MG tablet Take 20 mg by mouth daily.  . pilocarpine (PILOCAR) 4 % ophthalmic solution Place 1 drop into the right eye 4 (four) times daily. Wait 10 minutes between drops. (Keep eyes closed for 2 minutes after each drop)  . Polyethyl Glycol-Propyl Glycol 0.4-0.3 % SOLN Place 1 drop into the left eye as needed. Wait 10 minutes between drops. (Keep eyes closed for 2 minutes after each drop)   No facility-administered encounter medications on file as of 05/03/2021.    Review of Systems:  Review of Systems Review of Systems  Constitutional: Negative for activity change, appetite  change, chills, diaphoresis, fatigue and fever.  HENT: Negative for mouth sores, postnasal drip, rhinorrhea, sinus pain and sore throat.   Respiratory: Negative for apnea, cough, chest tightness, shortness of breath and wheezing.   Cardiovascular: Negative for chest pain, palpitations and leg swelling.  Gastrointestinal: Negative for abdominal distention, abdominal pain, constipation, diarrhea, nausea and vomiting.  Genitourinary: Negative for dysuria and frequency.  Musculoskeletal: Negative for arthralgias, joint swelling and myalgias.  Skin: Negative for rash.  Neurological: Negative for dizziness, syncope, weakness, light-headedness and numbness.  Psychiatric/Behavioral: Negative for behavioral problems, confusion and sleep disturbance.    Health Maintenance  Topic Date Due  . Zoster Vaccines- Shingrix (1 of 2) Never done  . INFLUENZA VACCINE  07/03/2021  . TETANUS/TDAP  11/07/2022  . DEXA SCAN  Completed  . COVID-19 Vaccine  Completed  . PNA vac Low Risk Adult  Completed  . HPV VACCINES  Aged Out    Physical Exam: Vitals:   05/03/21 1013  BP: (!) 94/58  Pulse: 73  Temp: (!) 96.5 F (35.8 C)  SpO2: 97%  Weight: 125 lb 6.4 oz (56.9 kg)  Height: 5\' 5"  (1.651 m)   Body mass index is 20.87 kg/m. Physical Exam Constitutional:. Well-developed and well-nourished.  HENT:  Head: Normocephalic.  Ear Wax in Right Ear Mouth/Throat: Oropharynx is clear and moist.  Eyes: Pupils are equal, round, and reactive to light.  Neck: Neck supple.  Cardiovascular: Normal rate and normal heart sounds.  No murmur heard. Pulmonary/Chest: Effort normal and breath sounds normal. No respiratory distress. No wheezes. She has no rales.  Abdominal: Soft. Bowel sounds are normal. No distension. There is no tenderness. There is no rebound.  Musculoskeletal: Mild Edema Bilateral Lymphadenopathy: none Neurological:  No Focal Deficits Gait stable No Dizziness Skin: Skin is warm and dry.   Psychiatric: Normal mood and affect. Behavior is normal. Thought content normal.   Labs reviewed: Basic Metabolic Panel: Recent Labs    11/07/20 0000  NA 140  K 4.4  CL 106  CO2 23*  BUN 16  CREATININE 0.9  CALCIUM 9.0   Liver Function Tests: Recent Labs    11/07/20 0000  ALBUMIN 4.0   No results for input(s): LIPASE, AMYLASE in the last 8760 hours. No results for input(s): AMMONIA in the last 8760 hours. CBC: Recent Labs    11/07/20 0000  WBC 4.5  HGB 12.6  HCT 34*  PLT 184   Lipid Panel: No results for input(s): CHOL, HDL, LDLCALC, TRIG, CHOLHDL, LDLDIRECT in the last 8760 hours. No results found for: HGBA1C  Procedures since last visit: No results found.  Assessment/Plan 1. Senile osteoporosis Started on Fosamax T score -5.  Already on Vit d  2. Primary hypertension Discontinue Olmesartan Check BP QD for  2 weeks and Follow  3. Mixed Alzheimer's and vascular dementia ([place]) Check MMSE before Next visit On Aricept Consider adding Namenda next visit  4. Primary osteoarthritis involving multiple joints Tylenol PRN  5. Generalized convulsive epilepsy without intractable epilepsy ([place]) On Lamictal Follows with Neurology 6 Mild Macrocytosis Check B12 level  Labs/tests ordered: CBC.CMP,B12 level and Vit D level  Total time spent in this patient care encounter was  45_  minutes; greater than 50% of the visit spent counseling patient and staff, reviewing records , Labs and coordinating care for problems addressed at this encounter.

## 2021-05-10 ENCOUNTER — Encounter: Payer: Self-pay | Admitting: Internal Medicine

## 2021-07-13 ENCOUNTER — Non-Acute Institutional Stay: Payer: Medicare Other | Admitting: Adult Health

## 2021-07-13 ENCOUNTER — Encounter: Payer: Self-pay | Admitting: Adult Health

## 2021-07-13 DIAGNOSIS — M1120 Other chondrocalcinosis, unspecified site: Secondary | ICD-10-CM | POA: Diagnosis not present

## 2021-07-13 DIAGNOSIS — M79642 Pain in left hand: Secondary | ICD-10-CM | POA: Diagnosis not present

## 2021-07-13 NOTE — Progress Notes (Signed)
Location:   [place] Room Number: [id number] Place of Service:  ALF [phone]) Provider:  Royal Hawthorn, NP  [name], MD  Patient Care Team: [name], MD as PCP - General (Internal Medicine) [name], MD as Consulting Physician (Neurology)  Extended Emergency Contact Information Primary Emergency Contact: [place] Phone: [phone] Mobile Phone: [phone] Relation: Relative  Code Status:  DNR Goals of care: Advanced Directive information Advanced Directives 05/03/2021  Does Patient Have a Medical Advance Directive? Yes  Type of Advance Directive [place]  Does patient want to make changes to medical advance directive? No - Patient declined  Copy of [place] in Chart? Yes - validated most recent copy scanned in chart (See row information)  Pre-existing out of facility DNR order (yellow form or pink MOST form) -     Chief Complaint  Patient presents with   Acute Visit    Left thumb swelling    HPI:  Pt is a 87 y.o. female seen today for an acute visit for left thumb and index finger swelling and pain. There is no report of injury to the hand but the pt has memory loss. There are no open areas. The swelling and pain have been present for a week but worse today. Very tender and warm. She has had this before in a wrist and was treated with prednisone which helped.    Past Medical History:  Diagnosis Date   Arthritis    Per [place] new patient packet    Epilepsy [facility])    Per [place] new patient packet    Glaucoma    Hearing deficit    Hearing aids   High blood pressure    Per [facility] new patient packet    History of abnormal weight loss    Low back pain    Memory difficulty 05/09/2016   MGUS (monoclonal gammopathy of unknown significance)    Neuropathy associated with MGUS ([place]) 11/09/2015   Radiculopathy of sacral and sacrococcygeal region    S1   Seizures ([place])    Past  Surgical History:  Procedure Laterality Date   APPENDECTOMY     BACK SURGERY     CATARACT EXTRACTION Left    OVARY SURGERY     PARATHYROIDECTOMY  9/   TRABECULECTOMY Left     Allergies  Allergen Reactions   Hctz [Hydrochlorothiazide]    Other Other (See Comments)    Caused sodium to drop    Allergies as of 07/13/2021       Reactions   Hctz [hydrochlorothiazide]    Other Other (See Comments)   Caused sodium to drop        Medication List        Accurate as of July 13, 2021  3:59 PM. If you have any questions, ask your nurse or doctor.          STOP taking these medications    Lumigan 0.01 % Soln Generic drug: bimatoprost Stopped by: Royal Hawthorn, NP   olmesartan 20 MG tablet Commonly known as: BENICAR Stopped by: Royal Hawthorn, NP       TAKE these medications    acetaminophen 325 MG tablet Commonly known as: TYLENOL Take 650 mg by mouth every 8 (eight) hours as needed.   alendronate 70 MG tablet Commonly known as: FOSAMAX Take 70 mg by mouth once a week. Take with a full glass of water on an empty stomach.   brimonidine  0.2 % ophthalmic solution Commonly known as: ALPHAGAN Place 1 drop into the right eye 3 (three) times daily. Wait 10 minutes between drops. (Keep eyes closed for 2 minutes after each drop)   chlorhexidine 0.12 % solution Commonly known as: PERIDEX Use as directed 15 mLs in the mouth or throat 2 (two) times daily.   diclofenac Sodium 1 % Gel Commonly known as: VOLTAREN Apply 2 g topically 2 (two) times daily.   donepezil 10 MG tablet Commonly known as: ARICEPT Take 10 mg by mouth at bedtime.   dorzolamide-timolol 22.3-6.8 MG/ML ophthalmic solution Commonly known as: COSOPT Place 1 drop into the right eye 2 (two) times daily. Wait 10 minutes between drops. (Keep eyes closed for 2 minutes after each drop)   lamoTRIgine 100 MG tablet Commonly known as: LAMICTAL Take 1 tablet in morning, 1 and 1/2 tablets at night    multivitamin tablet Take 1 tablet by mouth daily.   OcuSoft Eyelid Cleansing Pads Apply topically. Massage LEFT eyelid/lash line for 3 mins BID As Needed   pilocarpine 4 % ophthalmic solution Commonly known as: PILOCAR Place 1 drop into the right eye 4 (four) times daily. Wait 10 minutes between drops. (Keep eyes closed for 2 minutes after each drop)   Polyethyl Glycol-Propyl Glycol 0.4-0.3 % Soln Place 1 drop into the left eye as needed. Wait 10 minutes between drops. (Keep eyes closed for 2 minutes after each drop)   predniSONE 20 MG tablet Commonly known as: DELTASONE Take 40 mg by mouth daily with breakfast.   predniSONE 10 MG tablet Commonly known as: DELTASONE Take 30 mg by mouth daily with breakfast. Start taking on: July 17, 2021   predniSONE 20 MG tablet Commonly known as: DELTASONE Take 20 mg by mouth daily with breakfast. Start taking on: July 20, 2021   predniSONE 10 MG tablet Commonly known as: DELTASONE Take 10 mg by mouth daily with breakfast. Start taking on: July 22, 2021   Vitamin D3 50 MCG (2000 UT) Tabs Take by mouth.        Review of Systems  Constitutional:  Negative for activity change, appetite change, chills, diaphoresis, fatigue, fever and unexpected weight change.  HENT:  Negative for congestion.   Respiratory:  Negative for cough, shortness of breath and wheezing.   Cardiovascular:  Negative for chest pain, palpitations and leg swelling.  Gastrointestinal:  Negative for abdominal distention, abdominal pain, constipation and diarrhea.  Genitourinary:  Negative for difficulty urinating and dysuria.  Musculoskeletal:  Positive for arthralgias and joint swelling. Negative for back pain, gait problem and myalgias.  Neurological:  Negative for dizziness, tremors, seizures, syncope, facial asymmetry, speech difficulty, weakness, light-headedness, numbness and headaches.  Psychiatric/Behavioral:  Positive for confusion. Negative for  agitation and behavioral problems.    Immunization History  Administered Date(s) Administered   Influenza, High Dose Seasonal PF 09/26/2015, 09/03/2017, 08/28/2018, 09/23/2020   Influenza,inj,Quad PF,6+ Mos 10/01/2019   Influenza-Unspecified 09/22/2009, 08/27/2011, 10/08/2012, 12/08/2014, 02/10/2018   Moderna Sars-Covid-2 Vaccination 12/14/2019, 01/12/2020, 09/20/2020   Pneumococcal Conjugate-13 12/31/2013   Pneumococcal Polysaccharide-23 12/04/1999, 02/02/2016   Td 12/04/2000   Tdap 11/07/2012   Pertinent  Health Maintenance Due  Topic Date Due   INFLUENZA VACCINE  07/03/2021   DEXA SCAN  Completed   PNA vac Low Risk Adult  Completed   Fall Risk  05/03/2021 02/10/2021 11/09/2020 05/04/2020 01/13/2020  Falls in the past year? 0 0 0 0 0  Number falls in past yr: 0 0 0 0 0  Injury with Fall? - 0 0 0 0  Follow up - - - - -   Functional Status Survey:    Vitals:   07/13/21 1548  BP: 119/74  Pulse: 71  Resp: 20  Temp: (!) 97.3 F (36.3 C)  SpO2: 93%  Weight: 123 lb 9.6 oz (56.1 kg)  Height: '5\' 5"'$  (1.651 m)   Body mass index is 20.57 kg/m. Physical Exam Constitutional:      Appearance: Normal appearance.  Musculoskeletal:        General: Swelling (left thumb and index finger.) and tenderness (left thumb and index finger very tender) present.  Skin:    General: Skin is warm and dry.     Findings: Erythema (left thumb and index finger, no open areas) present.  Neurological:     Mental Status: She is alert. Mental status is at baseline.    Labs reviewed: Recent Labs    11/07/20 0000  NA 140  K 4.4  CL 106  CO2 23*  BUN 16  CREATININE 0.9  CALCIUM 9.0   Recent Labs    11/07/20 0000  ALBUMIN 4.0   Recent Labs    11/07/20 0000  WBC 4.5  HGB 12.6  HCT 34*  PLT 184   Lab Results  Component Value Date   TSH 1.76 09/10/2019   No results found for: HGBA1C Lab Results  Component Value Date   CHOL 241 (A) 09/10/2019   HDL 99 (A) 09/10/2019   LDLCALC 128  09/10/2019   TRIG 72 09/10/2019    Significant Diagnostic Results in last 30 days:  No results found.  Assessment/Plan  1. Pseudogout Could be gout but due to the location and hx would favor pseudogout Not likely an injury but given her memory loss will check xray  Begin Prednisone 40 mg qd x 4 days, 30 mg qd x 3 days, 20 mg qd x 2 days, 10 mg qd x 1 then d/c take with food    Family/ staff Communication: resident   Labs/tests ordered:   uric acid and CBC, left hand xray

## 2021-07-14 DIAGNOSIS — R2232 Localized swelling, mass and lump, left upper limb: Secondary | ICD-10-CM | POA: Diagnosis not present

## 2021-07-14 LAB — CBC AND DIFFERENTIAL
HCT: 29 — AB (ref 36–46)
Hemoglobin: 11.3 — AB (ref 12.0–16.0)
Platelets: 196 (ref 150–399)
WBC: 5.1

## 2021-07-14 LAB — CBC: RBC: 2.91 — AB (ref 3.87–5.11)

## 2021-07-17 LAB — URIC ACID: Uric Acid: 4

## 2021-07-25 DIAGNOSIS — H401133 Primary open-angle glaucoma, bilateral, severe stage: Secondary | ICD-10-CM | POA: Diagnosis not present

## 2021-10-07 DIAGNOSIS — M79641 Pain in right hand: Secondary | ICD-10-CM | POA: Diagnosis not present

## 2021-11-02 DIAGNOSIS — H401133 Primary open-angle glaucoma, bilateral, severe stage: Secondary | ICD-10-CM | POA: Diagnosis not present

## 2021-11-06 ENCOUNTER — Non-Acute Institutional Stay: Payer: Medicare Other | Admitting: Adult Health

## 2021-11-06 ENCOUNTER — Other Ambulatory Visit: Payer: Self-pay

## 2021-11-06 ENCOUNTER — Encounter: Payer: Self-pay | Admitting: Adult Health

## 2021-11-06 VITALS — BP 142/76 | HR 81 | Temp 98.2°F | Ht 65.0 in | Wt 122.8 lb

## 2021-11-06 DIAGNOSIS — G309 Alzheimer's disease, unspecified: Secondary | ICD-10-CM | POA: Diagnosis not present

## 2021-11-06 DIAGNOSIS — I10 Essential (primary) hypertension: Secondary | ICD-10-CM

## 2021-11-06 DIAGNOSIS — M810 Age-related osteoporosis without current pathological fracture: Secondary | ICD-10-CM | POA: Diagnosis not present

## 2021-11-06 DIAGNOSIS — F0280 Dementia in other diseases classified elsewhere without behavioral disturbance: Secondary | ICD-10-CM

## 2021-11-06 DIAGNOSIS — M1120 Other chondrocalcinosis, unspecified site: Secondary | ICD-10-CM

## 2021-11-06 DIAGNOSIS — F0150 Vascular dementia without behavioral disturbance: Secondary | ICD-10-CM

## 2021-11-06 DIAGNOSIS — E042 Nontoxic multinodular goiter: Secondary | ICD-10-CM | POA: Diagnosis not present

## 2021-11-06 DIAGNOSIS — G40309 Generalized idiopathic epilepsy and epileptic syndromes, not intractable, without status epilepticus: Secondary | ICD-10-CM | POA: Diagnosis not present

## 2021-11-06 NOTE — Progress Notes (Signed)
Location:  [facility]  Provider:  Cindi Carbon, [place] [phone]   Code Status: DNR Goals of Care:  Advanced Directives 05/03/2021  Does Patient Have a Medical Advance Directive? Yes  Type of Advance Directive [place]  Does patient want to make changes to medical advance directive? No - Patient declined  Copy of [place] in Chart? Yes - validated most recent copy scanned in chart (See row information)  Pre-existing out of facility DNR order (yellow form or pink MOST form) -     Chief Complaint  Patient presents with   Medical Management of Chronic Issues    Patient returns to the clinic for follow up.     HPI: Patient is a 87 y.o. female seen today for medical management of chronic diseases.    Resident with Mixed Alz and vascular dementia, some noted confusion but continues to be appropriate for AL and participates in activities.   She has no acute complaints Had pseudogout of the right hand and was treated with prednisone in August which resolved.   11/30/20 bone density -5.2, started on Fosamax in June without s/e  Has a hx of parathyroidectomy Thyroid nodules noted on U/S in 2019. No issues with difficulty swallowing or pain.  Hx of seizures on lamictal, no new  Past Medical History:  Diagnosis Date   Arthritis    Per [place] new patient packet    Epilepsy ([place])    Per [place] new patient packet    Glaucoma    Hearing deficit    Hearing aids   High blood pressure    Per [facility] new patient packet    History of abnormal weight loss    Low back pain    Memory difficulty 05/09/2016   MGUS (monoclonal gammopathy of unknown significance)    Neuropathy associated with MGUS ([place]) 11/09/2015   Radiculopathy of sacral and sacrococcygeal region    S1   Seizures ([place])     Past Surgical History:  Procedure Laterality Date   APPENDECTOMY     BACK SURGERY     CATARACT EXTRACTION Left    OVARY SURGERY      PARATHYROIDECTOMY  9/   TRABECULECTOMY Left     Allergies  Allergen Reactions   Hctz [Hydrochlorothiazide]    Other Other (See Comments)    Caused sodium to drop    Outpatient Encounter Medications as of 11/06/2021  Medication Sig   acetaminophen (TYLENOL) 325 MG tablet Take 650 mg by mouth every 8 (eight) hours as needed.   alendronate (FOSAMAX) 70 MG tablet Take 70 mg by mouth once a week. Take with a full glass of water on an empty stomach.   bimatoprost (LUMIGAN) 0.03 % ophthalmic solution 1 drop at bedtime.   brimonidine (ALPHAGAN) 0.2 % ophthalmic solution Place 1 drop into the right eye 3 (three) times daily. Wait 10 minutes between drops. (Keep eyes closed for 2 minutes after each drop)   chlorhexidine (PERIDEX) 0.12 % solution Use as directed 15 mLs in the mouth or throat 2 (two) times daily.   Cholecalciferol (VITAMIN D3) 50 MCG (2000 UT) TABS Take by mouth.   diclofenac Sodium (VOLTAREN) 1 % GEL Apply 2 g topically 2 (two) times daily.   donepezil (ARICEPT) 10 MG tablet Take 10 mg by mouth at bedtime.   dorzolamide-timolol (COSOPT) 22.3-6.8 MG/ML ophthalmic solution Place 1 drop into the right eye 2 (two) times daily. Wait 10 minutes between drops. (  Keep eyes closed for 2 minutes after each drop)   Eyelid Cleansers (OCUSOFT EYELID CLEANSING) PADS Apply topically. Massage LEFT eyelid/lash line for 3 mins BID As Needed   lamoTRIgine (LAMICTAL) 100 MG tablet Take 1 tablet in morning, 1 and 1/2 tablets at night   Multiple Vitamin (MULTIVITAMIN) tablet Take 1 tablet by mouth daily.   pilocarpine (PILOCAR) 4 % ophthalmic solution Place 1 drop into the right eye 4 (four) times daily. Wait 10 minutes between drops. (Keep eyes closed for 2 minutes after each drop)   Polyethyl Glycol-Propyl Glycol 0.4-0.3 % SOLN Place 1 drop into the left eye as needed. Wait 10 minutes between drops. (Keep eyes closed for 2 minutes after each drop)   No facility-administered encounter medications on file  as of 11/06/2021.    Review of Systems:  Review of Systems  Constitutional:  Negative for activity change, appetite change, chills, diaphoresis, fatigue, fever and unexpected weight change.  HENT:  Negative for congestion.   Respiratory:  Negative for cough, shortness of breath and wheezing.   Cardiovascular:  Negative for chest pain, palpitations and leg swelling.  Gastrointestinal:  Negative for abdominal distention, abdominal pain, constipation and diarrhea.  Genitourinary:  Negative for difficulty urinating and dysuria.  Musculoskeletal:  Positive for arthralgias and gait problem. Negative for back pain, joint swelling and myalgias.  Neurological:  Negative for dizziness, tremors, seizures, syncope, facial asymmetry, speech difficulty, weakness, light-headedness, numbness and headaches.  Psychiatric/Behavioral:  Negative for agitation, behavioral problems and confusion.    Health Maintenance  Topic Date Due   Zoster Vaccines- Shingrix (1 of 2) Never done   COVID-19 Vaccine (4 - Booster for Moderna series) 11/15/2020   INFLUENZA VACCINE  07/03/2021   TETANUS/TDAP  11/07/2022   Pneumonia Vaccine 36+ Years old  Completed   DEXA SCAN  Completed   HPV VACCINES  Aged Out    Physical Exam: Vitals:   11/06/21 1521  BP: (!) 142/76  Pulse: 81  Temp: 98.2 F (36.8 C)  SpO2: 96%  Weight: 122 lb 12.8 oz (55.7 kg)  Height: 5\' 5"  (1.651 m)   Body mass index is 20.43 kg/m. Wt Readings from Last 3 Encounters:  11/06/21 122 lb 12.8 oz (55.7 kg)  07/13/21 123 lb 9.6 oz (56.1 kg)  05/03/21 125 lb 6.4 oz (56.9 kg)    Physical Exam Vitals and nursing note reviewed.  Constitutional:      General: She is not in acute distress.    Appearance: She is not diaphoretic.  HENT:     Head: Normocephalic and atraumatic.     Right Ear: Tympanic membrane normal.     Left Ear: Tympanic membrane normal.     Nose: Nose normal.     Mouth/Throat:     Mouth: Mucous membranes are moist.      Pharynx: Oropharynx is clear.  Eyes:     Conjunctiva/sclera: Conjunctivae normal.     Pupils: Pupils are equal, round, and reactive to light.  Neck:     Vascular: No JVD.  Cardiovascular:     Rate and Rhythm: Normal rate and regular rhythm.     Heart sounds: No murmur heard. Pulmonary:     Effort: Pulmonary effort is normal. No respiratory distress.     Breath sounds: Wheezing present.  Abdominal:     General: Abdomen is flat. Bowel sounds are normal. There is no distension.     Palpations: Abdomen is soft.     Tenderness: There is no abdominal tenderness.  Musculoskeletal:        General: No swelling, tenderness, deformity or signs of injury.     Cervical back: No rigidity or tenderness.     Right lower leg: No edema.     Left lower leg: No edema.  Lymphadenopathy:     Cervical: No cervical adenopathy.  Skin:    General: Skin is warm and dry.  Neurological:     General: No focal deficit present.     Mental Status: She is alert. Mental status is at baseline.  Psychiatric:        Mood and Affect: Mood normal.    Labs reviewed: Basic Metabolic Panel: Recent Labs    11/07/20 0000  NA 140  K 4.4  CL 106  CO2 23*  BUN 16  CREATININE 0.9  CALCIUM 9.0   Liver Function Tests: Recent Labs    11/07/20 0000  ALBUMIN 4.0   No results for input(s): LIPASE, AMYLASE in the last 8760 hours. No results for input(s): AMMONIA in the last 8760 hours. CBC: Recent Labs    11/07/20 0000 07/14/21 0000  WBC 4.5 5.1  HGB 12.6 11.3*  HCT 34* 29*  PLT 184 196   Lipid Panel: No results for input(s): CHOL, HDL, LDLCALC, TRIG, CHOLHDL, LDLDIRECT in the last 8760 hours. No results found for: HGBA1C  Procedures since last visit: No results found.  Assessment/Plan   1. Mixed Alzheimer's and vascular dementia ([place]) Needs repeat MMSE Remains appropriate for AL Has mild to moderate symptoms Continue Aricept.   2. Essential hypertension Controlled without meds Goal  <150/90  3. Multiple thyroid nodules Ordered thyroid ultrsound In 2019 scan results were to monitor by U/S annually for 5 years Also ordered TSH and free T4  4. Generalized convulsive epilepsy without intractable epilepsy ([place]) No new  On lamictal  5. Senile osteoporosis Continues on fosamax started 2022 Continue Vit D 2000 units qd  6. Pseudogout Resolved  Shingrix vaccine has been ordered in the past. I have asked the staff document whether it has been given as it is not documented. Pt can't remember   Labs/tests ordered:  * No order type specified * TSH free T4 to labs ordered for tomorrow  Next appt: 3 months with Dr. Lyndel Safe    Total time 32min:  time greater than 50% of total time spent doing pt counseling and coordination of care

## 2021-11-07 DIAGNOSIS — R229 Localized swelling, mass and lump, unspecified: Secondary | ICD-10-CM | POA: Diagnosis not present

## 2021-11-07 DIAGNOSIS — E039 Hypothyroidism, unspecified: Secondary | ICD-10-CM | POA: Diagnosis not present

## 2021-11-07 DIAGNOSIS — R413 Other amnesia: Secondary | ICD-10-CM | POA: Diagnosis not present

## 2021-11-07 DIAGNOSIS — I10 Essential (primary) hypertension: Secondary | ICD-10-CM | POA: Diagnosis not present

## 2021-11-07 LAB — BASIC METABOLIC PANEL
BUN: 15 (ref 4–21)
CO2: 21 (ref 13–22)
Chloride: 104 (ref 99–108)
Creatinine: 0.9 (ref 0.5–1.1)
Glucose: 89
Potassium: 3.6 (ref 3.4–5.3)
Sodium: 140 (ref 137–147)

## 2021-11-07 LAB — COMPREHENSIVE METABOLIC PANEL
Albumin: 3.9 (ref 3.5–5.0)
Calcium: 8.5 — AB (ref 8.7–10.7)
Globulin: 2.2

## 2021-11-07 LAB — CBC AND DIFFERENTIAL
HCT: 35 — AB (ref 36–46)
Hemoglobin: 12.1 (ref 12.0–16.0)
Platelets: 138 — AB (ref 150–399)
WBC: 4.7

## 2021-11-07 LAB — HEPATIC FUNCTION PANEL
ALT: 15 (ref 7–35)
AST: 24 (ref 13–35)
Alkaline Phosphatase: 97 (ref 25–125)
Bilirubin, Total: 0.3

## 2021-11-07 LAB — CBC: RBC: 3.48 — AB (ref 3.87–5.11)

## 2021-11-07 LAB — VITAMIN B12: Vitamin B-12: 295

## 2021-11-07 LAB — TSH: TSH: 1.38 (ref 0.41–5.90)

## 2021-11-10 LAB — T4, FREE: Free T4: 5.6

## 2021-11-21 LAB — TSH: TSH: 1.91 (ref 0.41–5.90)

## 2021-12-01 LAB — T4, FREE: Free T4: 5.8

## 2021-12-15 DIAGNOSIS — H401133 Primary open-angle glaucoma, bilateral, severe stage: Secondary | ICD-10-CM | POA: Diagnosis not present

## 2022-01-04 ENCOUNTER — Encounter: Payer: Self-pay | Admitting: Adult Health

## 2022-01-04 ENCOUNTER — Non-Acute Institutional Stay: Payer: Medicare Other | Admitting: Adult Health

## 2022-01-04 DIAGNOSIS — L989 Disorder of the skin and subcutaneous tissue, unspecified: Secondary | ICD-10-CM

## 2022-01-04 DIAGNOSIS — M79672 Pain in left foot: Secondary | ICD-10-CM | POA: Diagnosis not present

## 2022-01-04 DIAGNOSIS — M79675 Pain in left toe(s): Secondary | ICD-10-CM

## 2022-01-04 NOTE — Progress Notes (Signed)
Location:   [facility] Room Number: 269 Place of Service:  SNF [phone]) Provider:  Royal Hawthorn, NP   Tracy Dad, MD  Patient Care Team: Tracy Dad, MD as PCP - General (Internal Medicine) [name], MD (Inactive) as Consulting Physician (Neurology)  Extended Emergency Contact Information Primary Emergency Contact: [place] Phone: [phone] Mobile Phone: [phone] Relation: Relative  Code Status:  DNR Goals of care: Advanced Directive information Advanced Directives 05/03/2021  Does Patient Have a Medical Advance Directive? Yes  Type of Advance Directive [place]  Does patient want to make changes to medical advance directive? No - Patient declined  Copy of [place] in Chart? Yes - validated most recent copy scanned in chart (See row information)  Pre-existing out of facility DNR order (yellow form or pink MOST form) -     Chief Complaint  Patient presents with   Acute Visit    Toe pain     HPI:  Pt is a 88 y.o. female seen today for an acute visit for    Past Medical History:  Diagnosis Date   Arthritis    Per [place] new patient packet    Epilepsy ([place])    Per [place] new patient packet    Glaucoma    Hearing deficit    Hearing aids   High blood pressure    Per [place] new patient packet    History of abnormal weight loss    Low back pain    Memory difficulty 05/09/2016   MGUS (monoclonal gammopathy of unknown significance)    Neuropathy associated with MGUS ([place]) 11/09/2015   Radiculopathy of sacral and sacrococcygeal region    S1   Seizures ([place])    Past Surgical History:  Procedure Laterality Date   APPENDECTOMY     BACK SURGERY     CATARACT EXTRACTION Left    OVARY SURGERY     PARATHYROIDECTOMY  9/   TRABECULECTOMY Left     Allergies  Allergen Reactions   Hctz [Hydrochlorothiazide]    Other Other (See Comments)    Caused sodium to drop     Allergies as of 01/04/2022       Reactions   Hctz [hydrochlorothiazide]    Other Other (See Comments)   Caused sodium to drop        Medication List        Accurate as of January 04, 2022  3:52 PM. If you have any questions, ask your nurse or doctor.          acetaminophen 325 MG tablet Commonly known as: TYLENOL Take 650 mg by mouth every 8 (eight) hours as needed.   alendronate 70 MG tablet Commonly known as: FOSAMAX Take 70 mg by mouth once a week. Take with a full glass of water on an empty stomach.   bimatoprost 0.03 % ophthalmic solution Commonly known as: LUMIGAN 1 drop at bedtime.   brimonidine 0.2 % ophthalmic solution Commonly known as: ALPHAGAN Place 1 drop into the right eye 3 (three) times daily. Wait 10 minutes between drops. (Keep eyes closed for 2 minutes after each drop)   chlorhexidine 0.12 % solution Commonly known as: PERIDEX Use as directed 15 mLs in the mouth or throat 2 (two) times daily.   cyanocobalamin 1000 MCG tablet Take 1,000 mcg by mouth in the morning.   diclofenac Sodium 1 % Gel Commonly known as: VOLTAREN Apply 2 g topically 2 (two) times  daily.   donepezil 10 MG tablet Commonly known as: ARICEPT Take 10 mg by mouth at bedtime.   dorzolamide-timolol 22.3-6.8 MG/ML ophthalmic solution Commonly known as: COSOPT Place 1 drop into the right eye 2 (two) times daily. Wait 10 minutes between drops. (Keep eyes closed for 2 minutes after each drop)   lamoTRIgine 100 MG tablet Commonly known as: LAMICTAL Take 1 tablet in morning, 1 and 1/2 tablets at night   multivitamin tablet Take 1 tablet by mouth daily.   OcuSoft Eyelid Cleansing Pads Apply topically. Massage LEFT eyelid/lash line for 3 mins BID As Needed   pilocarpine 4 % ophthalmic solution Commonly known as: PILOCAR Place 1 drop into the right eye 4 (four) times daily. Wait 10 minutes between drops. (Keep eyes closed for 2 minutes after each drop)   Polyethyl  Glycol-Propyl Glycol 0.4-0.3 % Soln Place 1 drop into the left eye as needed. Wait 10 minutes between drops. (Keep eyes closed for 2 minutes after each drop)   Vitamin D3 50 MCG (2000 UT) Tabs Take by mouth.        Review of Systems  Immunization History  Administered Date(s) Administered   Influenza, High Dose Seasonal PF 09/26/2015, 09/03/2017, 08/28/2018, 09/23/2020   Influenza,inj,Quad PF,6+ Mos 10/01/2019   Influenza-Unspecified 09/22/2009, 08/27/2011, 10/08/2012, 12/08/2014, 02/10/2018   Moderna Sars-Covid-2 Vaccination 12/14/2019, 01/12/2020, 09/20/2020   Pneumococcal Conjugate-13 12/31/2013   Pneumococcal Polysaccharide-23 12/04/1999, 02/02/2016   Td 12/04/2000   Tdap 11/07/2012   Pertinent  Health Maintenance Due  Topic Date Due   INFLUENZA VACCINE  07/03/2021   DEXA SCAN  Completed   Fall Risk 01/13/2020 05/04/2020 11/09/2020 02/10/2021 05/03/2021  Falls in the past year? 0 0 0 0 0  Was there an injury with Fall? 0 0 0 0 -  Fall Risk Category Calculator 0 0 0 0 -  Fall Risk Category Low Low Low Low -  Patient Fall Risk Level Low fall risk Low fall risk Low fall risk Low fall risk -  Fall risk Follow up - - - - -   Functional Status Survey:    Vitals:   01/04/22 1540  BP: (!) 93/57  Pulse: 67  Resp: 17  Temp: (!) 96.9 F (36.1 C)  SpO2: 96%  Weight: 123 lb 3.2 oz (55.9 kg)  Height: 5\' 5"  (1.651 m)   Body mass index is 20.5 kg/m. Physical Exam  Labs reviewed: Recent Labs    11/07/21 0000  NA 140  K 3.6  CL 104  CO2 21  BUN 15  CREATININE 0.9  CALCIUM 8.5*   Recent Labs    11/07/21 0000  AST 24  ALT 15  ALKPHOS 97  ALBUMIN 3.9   Recent Labs    07/14/21 0000 11/07/21 0000  WBC 5.1 4.7  HGB 11.3* 12.1  HCT 29* 35*  PLT 196 138*   Lab Results  Component Value Date   TSH 1.91 11/21/2021   No results found for: HGBA1C Lab Results  Component Value Date   CHOL 241 (A) 09/10/2019   HDL 99 (A) 09/10/2019   LDLCALC 128 09/10/2019    TRIG 72 09/10/2019    Significant Diagnostic Results in last 30 days:  No results found.  Assessment/Plan There are no diagnoses linked to this encounter.   Family/ staff Communication:   Labs/tests ordered:

## 2022-01-04 NOTE — Progress Notes (Signed)
This encounter was created in error - please disregard.

## 2022-01-04 NOTE — Progress Notes (Signed)
Location:  Occupational psychologist of Service:  ALF (13) Provider:   Cindi Carbon, [place] [phone]   [name], MD  Patient Care Team: [name], MD as PCP - General (Internal Medicine) [name], MD (Inactive) as Consulting Physician (Neurology)  Extended Emergency Contact Information Primary Emergency Contact: [place] Phone: [phone] Mobile Phone: [phone] Relation: Relative  Code Status:  DNR Goals of care: Advanced Directive information Advanced Directives 05/03/2021  Does Patient Have a Medical Advance Directive? Yes  Type of Advance Directive [place]  Does patient want to make changes to medical advance directive? No - Patient declined  Copy of [place] in Chart? Yes - validated most recent copy scanned in chart (See row information)  Pre-existing out of facility DNR order (yellow form or pink MOST form) -     Chief Complaint  Patient presents with   Acute Visit    Toe pain    HPI:  Pt is a 88 y.o. female seen today for an acute visit for left toe pain 2nd toe.  Pain present for two days. She is having some pain with walking. When I enter the room she has the foot elevated. She has memory loss and can not remember falling or hitting her toe. She is not having any numbness or tingling. She is able to bear weight. She reports there is a skin lesion on her left leg that is painful. She keeps picking at it. She has seen the dermatologist in the past about this issue. When the nurse tries to cover the are she removes the bandaid.    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   alendronate (FOSAMAX) 70 MG tablet Take 70 mg by mouth once a week. Take with a full glass of water on an empty stomach.   bimatoprost (LUMIGAN) 0.03 % ophthalmic solution 1 drop at bedtime.   brimonidine (ALPHAGAN) 0.2 % ophthalmic solution Place 1 drop into the right eye 3 (three) times daily. Wait 10 minutes between drops. (Keep eyes closed for 2 minutes after each drop)   chlorhexidine (PERIDEX) 0.12 % solution Use as directed 15 mLs in the mouth or throat 2 (two) times daily.   Cholecalciferol (VITAMIN D3) 50 MCG (2000 UT) TABS  Take by mouth.   diclofenac Sodium (VOLTAREN) 1 % GEL Apply 2 g topically 2 (two) times daily.   donepezil (ARICEPT) 10 MG tablet Take 10 mg by mouth at bedtime.   dorzolamide-timolol (COSOPT) 22.3-6.8 MG/ML ophthalmic solution Place 1 drop into the right eye 2 (two) times daily. Wait 10 minutes between drops. (Keep eyes closed for 2 minutes after each drop)   Eyelid Cleansers (OCUSOFT EYELID CLEANSING) PADS Apply topically. Massage LEFT eyelid/lash line for 3 mins BID As Needed   lamoTRIgine (LAMICTAL) 100 MG tablet Take 1 tablet in morning, 1 and 1/2 tablets at night   Multiple Vitamin (MULTIVITAMIN) tablet Take 1 tablet by mouth daily.   pilocarpine (PILOCAR) 4 % ophthalmic solution Place 1 drop into the right eye 4  (four) times daily. Wait 10 minutes between drops. (Keep eyes closed for 2 minutes after each drop)   Polyethyl Glycol-Propyl Glycol 0.4-0.3 % SOLN Place 1 drop into the left eye as needed. Wait 10 minutes between drops. (Keep eyes closed for 2 minutes after each drop)   No facility-administered encounter medications on file as of 01/04/2022.    Review of Systems  Constitutional: Negative.   Musculoskeletal:  Positive for gait problem and joint swelling. Negative for arthralgias, back pain, myalgias, neck pain and neck stiffness.  Skin:  Positive for color change. Negative for pallor, rash and wound.       Left second toe bruise Left lower leg lesion  Psychiatric/Behavioral:  Positive for confusion.    Immunization History  Administered Date(s) Administered   Influenza, High Dose Seasonal PF 09/26/2015, 09/03/2017, 08/28/2018, 09/23/2020   Influenza,inj,Quad PF,6+ Mos 10/01/2019   Influenza-Unspecified 09/22/2009, 08/27/2011, 10/08/2012, 12/08/2014, 02/10/2018   Moderna Sars-Covid-2 Vaccination 12/14/2019, 01/12/2020, 09/20/2020   Pneumococcal Conjugate-13 12/31/2013   Pneumococcal Polysaccharide-23 12/04/1999, 02/02/2016   Td 12/04/2000   Tdap 11/07/2012   Pertinent  Health Maintenance Due  Topic Date Due   INFLUENZA VACCINE  07/03/2021   DEXA SCAN  Completed   Fall Risk 01/13/2020 05/04/2020 11/09/2020 02/10/2021 05/03/2021  Falls in the past year? 0 0 0 0 0  Was there an injury with Fall? 0 0 0 0 -  Fall Risk Category Calculator 0 0 0 0 -  Fall Risk Category Low Low Low Low -  Patient Fall Risk Level Low fall risk Low fall risk Low fall risk Low fall risk -  Fall risk Follow up - - - - -   Functional Status Survey:    There were no vitals filed for this visit. There is no height or weight on file to calculate BMI. Physical Exam Cardiovascular:     Pulses:          Dorsalis pedis pulses are 2+ on the right side and 2+ on the left side.  Musculoskeletal:        General:  Swelling and tenderness (left second toe) present. No deformity.  Skin:    General: Skin is warm and dry.     Findings: Bruising (left second toe) present.     Comments: Left lower anterior shin with raise ski lesion. Surrounding erythema. Tender to touch. Variation in color.     Labs reviewed: Recent Labs    11/07/21 0000  NA 140  K 3.6  CL 104  CO2 21  BUN 15  CREATININE 0.9  CALCIUM 8.5*   Recent Labs    11/07/21 0000  AST 24  ALT 15  ALKPHOS 97  ALBUMIN 3.9   Recent Labs  07/14/21 0000 11/07/21 0000  WBC 5.1 4.7  HGB 11.3* 12.1  HCT 29* 35*  PLT 196 138*   Lab Results  Component Value Date   TSH 1.91 11/21/2021   No results found for: HGBA1C Lab Results  Component Value Date   CHOL 241 (A) 09/10/2019   HDL 99 (A) 09/10/2019   LDLCALC 128 09/10/2019   TRIG 72 09/10/2019    Significant Diagnostic Results in last 30 days:  No results found.  Assessment/Plan 1. Pain of toe of left foot Xray left foot two view to rule out fracture of 2nd toe Recommend ice but she refused She did not want any tylenol Did agree to elevate it  2. Skin lesion She is picking at this area which is tender with variation in color Recommend f/u with dermatology    Family/ staff Communication: nurse  Labs/tests ordered:  xray left foot    Total time 55min:  time greater than 50% of total time spent doing pt counseling and coordination of care

## 2022-01-17 DIAGNOSIS — D229 Melanocytic nevi, unspecified: Secondary | ICD-10-CM | POA: Diagnosis not present

## 2022-01-17 DIAGNOSIS — D1800 Hemangioma unspecified site: Secondary | ICD-10-CM | POA: Diagnosis not present

## 2022-01-17 DIAGNOSIS — L820 Inflamed seborrheic keratosis: Secondary | ICD-10-CM | POA: Diagnosis not present

## 2022-01-17 DIAGNOSIS — D485 Neoplasm of uncertain behavior of skin: Secondary | ICD-10-CM | POA: Diagnosis not present

## 2022-02-06 DIAGNOSIS — M79672 Pain in left foot: Secondary | ICD-10-CM | POA: Diagnosis not present

## 2022-02-06 DIAGNOSIS — B351 Tinea unguium: Secondary | ICD-10-CM | POA: Diagnosis not present

## 2022-02-06 DIAGNOSIS — M79671 Pain in right foot: Secondary | ICD-10-CM | POA: Diagnosis not present

## 2022-02-06 DIAGNOSIS — L84 Corns and callosities: Secondary | ICD-10-CM | POA: Diagnosis not present

## 2022-02-07 ENCOUNTER — Encounter: Payer: Self-pay | Admitting: Internal Medicine

## 2022-02-07 ENCOUNTER — Other Ambulatory Visit: Payer: Self-pay

## 2022-02-07 ENCOUNTER — Non-Acute Institutional Stay: Payer: Medicare Other | Admitting: Internal Medicine

## 2022-02-07 VITALS — BP 132/68 | HR 86 | Temp 97.7°F | Ht 65.0 in | Wt 124.0 lb

## 2022-02-07 DIAGNOSIS — I10 Essential (primary) hypertension: Secondary | ICD-10-CM

## 2022-02-07 DIAGNOSIS — R21 Rash and other nonspecific skin eruption: Secondary | ICD-10-CM

## 2022-02-07 DIAGNOSIS — E538 Deficiency of other specified B group vitamins: Secondary | ICD-10-CM | POA: Diagnosis not present

## 2022-02-07 DIAGNOSIS — F0150 Vascular dementia without behavioral disturbance: Secondary | ICD-10-CM

## 2022-02-07 DIAGNOSIS — H6123 Impacted cerumen, bilateral: Secondary | ICD-10-CM

## 2022-02-07 DIAGNOSIS — M810 Age-related osteoporosis without current pathological fracture: Secondary | ICD-10-CM

## 2022-02-07 DIAGNOSIS — G309 Alzheimer's disease, unspecified: Secondary | ICD-10-CM

## 2022-02-07 DIAGNOSIS — F0280 Dementia in other diseases classified elsewhere without behavioral disturbance: Secondary | ICD-10-CM

## 2022-02-07 NOTE — Progress Notes (Signed)
Location:  Occupational psychologist of Service:  Clinic (12)  Provider: [name]   Code Status: DNR Goals of Care:  Advanced Directives 05/03/2021  Does Patient Have a Medical Advance Directive? Yes  Type of Advance Directive [place]  Does patient want to make changes to medical advance directive? No - Patient declined  Copy of [place] in Chart? Yes - validated most recent copy scanned in chart (See row information)  Pre-existing out of facility DNR order (yellow form or pink MOST form) -     Chief Complaint  Patient presents with   Medical Management of Chronic Issues    Patient returns to the clinic for follow up. She would like to discuss her eye issues.    Quality Metric Gaps    Verified [facility] and [facility] patient is due for Zoster Vaccines- Shingrix (1 of 2) and COVID-19 Vaccine      HPI: Patient is a 88 y.o. female seen today for medical management of chronic diseases.     Has h/o Alzheimer Dementia, h/o Seizure disorder and Osteoarthritis and Hypertension H/o Peripheral Neuropathy  [place] Continues to walk with no assist No Falls No Behavior issues Needs reminders for her Continent care She had no complains She says she cant see from her Right Eye. 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   alendronate (FOSAMAX) 70 MG tablet Take 70 mg by mouth once a week. Take with a full glass of water on an empty stomach.   bimatoprost (LUMIGAN) 0.03 % ophthalmic solution 1 drop at bedtime.   brimonidine (ALPHAGAN) 0.2 % ophthalmic solution Place 1 drop into the right eye 3 (three) times daily. Wait 10 minutes between drops. (Keep eyes closed for 2 minutes after each drop)   chlorhexidine (PERIDEX) 0.12 % solution Use as directed 15 mLs in the mouth or throat 2 (two) times daily.   Cholecalciferol (VITAMIN D3) 50 MCG (2000 UT) TABS Take by mouth.   cyanocobalamin 1000 MCG  tablet Take 1,000 mcg by mouth in the morning.   diclofenac Sodium (VOLTAREN) 1 % GEL Apply 2 g topically 2 (two) times daily.   donepezil (ARICEPT) 10 MG tablet Take 10 mg by mouth at bedtime.   dorzolamide-timolol (COSOPT) 22.3-6.8 MG/ML ophthalmic solution Place 1 drop into the right eye 2 (two) times daily. Wait 10 minutes between drops. (Keep eyes closed for 2 minutes after each drop)   Eyelid Cleansers (OCUSOFT EYELID CLEANSING) PADS Apply topically. Massage LEFT eyelid/lash line for 3 mins BID As Needed   lamoTRIgine (LAMICTAL) 100 MG tablet Take 1 tablet in morning, 1 and 1/2 tablets at night   Multiple Vitamin (MULTIVITAMIN) tablet Take 1 tablet by mouth daily.   pilocarpine (PILOCAR) 4 % ophthalmic solution Place 1 drop into the right eye 4 (four) times daily. Wait 10 minutes between drops.  (Keep eyes closed for 2 minutes after each drop)   Polyethyl Glycol-Propyl Glycol 0.4-0.3 % SOLN Place 1 drop into the left eye as needed. Wait 10 minutes between drops. (Keep eyes closed for 2 minutes after each drop)   No facility-administered encounter medications on file as of 02/07/2022.    Review of Systems:  Review of Systems  Constitutional:  Negative for activity change and appetite change.  HENT: Negative.    Respiratory:  Negative for cough and shortness of breath.   Cardiovascular:  Negative for leg swelling.  Gastrointestinal:  Negative for constipation.  Genitourinary:  Positive for frequency.  Musculoskeletal:  Negative for arthralgias, gait problem and myalgias.  Skin: Negative.   Neurological:  Negative for dizziness and weakness.  Psychiatric/Behavioral:  Positive for confusion. Negative for dysphoric mood and sleep disturbance.    Health Maintenance  Topic Date Due   Zoster Vaccines- Shingrix (1 of 2) Never done   COVID-19 Vaccine (5 - Booster for Moderna series) 12/08/2020   TETANUS/TDAP  11/07/2022   Pneumonia Vaccine 4+ Years old  Completed   INFLUENZA VACCINE  Completed   DEXA SCAN  Completed   HPV VACCINES  Aged Out    Physical Exam: There were no vitals filed for this visit. There is no height or weight on file to calculate BMI. Physical Exam Vitals reviewed.  Constitutional:      Appearance: Normal appearance.  HENT:     Head: Normocephalic.     Right Ear: Tympanic membrane normal.     Left Ear: Tympanic membrane normal.     Ears:     Comments: Wax in both ears    Nose: Nose normal.     Mouth/Throat:     Mouth: Mucous membranes are moist.     Pharynx: Oropharynx is clear.  Eyes:     Pupils: Pupils are equal, round, and reactive to light.  Cardiovascular:     Rate and Rhythm: Normal rate and regular rhythm.     Pulses: Normal pulses.     Heart sounds: Normal heart sounds. No murmur heard. Pulmonary:     Effort: Pulmonary effort is normal.      Breath sounds: Normal breath sounds.  Abdominal:     General: Abdomen is flat. Bowel sounds are normal.     Palpations: Abdomen is soft.  Musculoskeletal:        General: No swelling.     Cervical back: Neck supple.  Skin:    General: Skin is warm.  Neurological:     General: No focal deficit present.     Mental Status: She is alert.  Comments: Has mild Aphasia   Psychiatric:        Mood and Affect: Mood normal.        Thought Content: Thought content normal.   MMSE - Mini Mental State Exam 02/07/2022 12/01/2019 01/14/2018  Orientation to time '4 2 4  '$ Orientation to Place '4 5 5  '$ Registration '3 3 3  '$ Attention/ Calculation '5 5 5  '$ Recall 1 0 1  Language- name 2 objects '2 2 2  '$ Language- repeat '1 1 1  '$ Language- follow 3 step command '3 3 3  '$ Language- read & follow direction '1 1 1  '$ Write a sentence '1 1 1  '$ Copy design '1 1 1  '$ Total score '26 24 27     '$ Labs reviewed: Basic Metabolic Panel: Recent Labs    11/07/21 0000 11/21/21 0000  NA 140  --   K 3.6  --   CL 104  --   CO2 21  --   BUN 15  --   CREATININE 0.9  --   CALCIUM 8.5*  --   TSH 1.38 1.91   Liver Function Tests: Recent Labs    11/07/21 0000  AST 24  ALT 15  ALKPHOS 97  ALBUMIN 3.9   No results for input(s): LIPASE, AMYLASE in the last 8760 hours. No results for input(s): AMMONIA in the last 8760 hours. CBC: Recent Labs    07/14/21 0000 11/07/21 0000  WBC 5.1 4.7  HGB 11.3* 12.1  HCT 29* 35*  PLT 196 138*   Lipid Panel: No results for input(s): CHOL, HDL, LDLCALC, TRIG, CHOLHDL, LDLDIRECT in the last 8760 hours. No results found for: HGBA1C  Procedures since last visit: No results found.  Assessment/Plan 1. Mixed Alzheimer's and vascular dementia ([place]) ON Aricept Will add Namenda 5 mg QD Will slowly increase the dose Continues to manage in AL with help of Nurses MMSE 26 Failed her Clock 2. Essential hypertension Off All her Meds BP in good range for her  3. Senile  osteoporosis Due to her age decided to do just Fosamax Hard to get Prolia in AL T score -5.  Already on Vit d  4. Rash ? Picking on her Skin Try Hydrocortisone PRN  5. Vitamin B 12 deficiency Now on B 12  6. Bilateral impacted cerumen Removal per nurses 7 Generalized convulsive epilepsy without intractable epilepsy ([place]) On Lamictal Follows with Neurology   Labs/tests ordered:  * No order type specified *  [name], MD

## 2022-02-14 DIAGNOSIS — D229 Melanocytic nevi, unspecified: Secondary | ICD-10-CM | POA: Diagnosis not present

## 2022-02-14 DIAGNOSIS — L821 Other seborrheic keratosis: Secondary | ICD-10-CM | POA: Diagnosis not present

## 2022-02-14 DIAGNOSIS — L814 Other melanin hyperpigmentation: Secondary | ICD-10-CM | POA: Diagnosis not present

## 2022-02-14 DIAGNOSIS — L820 Inflamed seborrheic keratosis: Secondary | ICD-10-CM | POA: Diagnosis not present

## 2022-03-16 DIAGNOSIS — H401133 Primary open-angle glaucoma, bilateral, severe stage: Secondary | ICD-10-CM | POA: Diagnosis not present

## 2022-03-20 ENCOUNTER — Non-Acute Institutional Stay: Payer: Medicare Other | Admitting: Orthopedic Surgery

## 2022-03-20 ENCOUNTER — Encounter: Payer: Self-pay | Admitting: Orthopedic Surgery

## 2022-03-20 DIAGNOSIS — M810 Age-related osteoporosis without current pathological fracture: Secondary | ICD-10-CM | POA: Diagnosis not present

## 2022-03-20 DIAGNOSIS — R32 Unspecified urinary incontinence: Secondary | ICD-10-CM

## 2022-03-20 DIAGNOSIS — I10 Essential (primary) hypertension: Secondary | ICD-10-CM

## 2022-03-20 DIAGNOSIS — F0150 Vascular dementia without behavioral disturbance: Secondary | ICD-10-CM

## 2022-03-20 DIAGNOSIS — G309 Alzheimer's disease, unspecified: Secondary | ICD-10-CM

## 2022-03-20 DIAGNOSIS — R21 Rash and other nonspecific skin eruption: Secondary | ICD-10-CM | POA: Diagnosis not present

## 2022-03-20 DIAGNOSIS — G40309 Generalized idiopathic epilepsy and epileptic syndromes, not intractable, without status epilepticus: Secondary | ICD-10-CM | POA: Diagnosis not present

## 2022-03-20 DIAGNOSIS — F0280 Dementia in other diseases classified elsewhere without behavioral disturbance: Secondary | ICD-10-CM

## 2022-03-20 NOTE — Progress Notes (Addendum)
?Location:  [facility] ?[facility] Room Number: 027/O ?Place of Service:  ALF (13) ?Provider: Yvonna Alanis, NP ? ? ?Patient Care Team: ?[name], MD as PCP - General (Internal Medicine) ?[name], MD (Inactive) as Consulting Physician (Neurology) ? ?Extended Emergency Contact Information ?Primary Emergency Contact: Tracy Nielsen ?Home Phone: [phone] ?Mobile Phone: [phone] ?Relation: Relative ? ?Code Status:  DNR ?Goals of care: Advanced Directive information ? ?  03/20/2022  ?  9:15 AM  ?Advanced Directives  ?Does Patient Have a Medical Advance Directive? Yes  ?Type of Paramedic of [place];Out of facility DNR (pink MOST or yellow form)  ?Does patient want to make changes to medical advance directive? No - Patient declined  ?Copy of [place] in Chart? Yes - validated most recent copy scanned in chart (See row information)  ? ? ? ?Chief Complaint  ?Patient presents with  ? Medical Management of Chronic Issues  ?  Routine visit.  ? Quality Metric Gaps  ?  Discuss the need for Shingrix vaccine and additional Covid booster, or post pone if patient refuses.   ? ? ?HPI:  ?Pt is a 88 y.o. female seen today for medical management of chronic diseases.   ? ?Alzheimers- MMSE 26/30 11/2021, needing more instruction and care in past 2 months, she will make bed with soiled sheets/linens, getting agitated with staff more, Namenda added last month, remains on Aricept ?HTN- BUN/creat 15/0.9 11/07/2021, off medication at this time, see pressures below ?Epilepsy- followed by neurology, no recent seizures, remains on Lamictal ?Osteoporosis- DEXA 2021, t score -5.2, remains on Fosamax ?Rash- resolved with hydrocortisone prn ? ?She kept receiving calls from solicitors during our encounter, 4 in total. She gave her name and age during these calls. Educated patient about identity theft. Advised her to only talk with family or friends. Concerns  given to nursing staff.  ? ?Blood pressures: ? 04/16- 115/68 ? 04/09- 129/75 ? 04/02- 119/78 ? ?Recent weights: ? 03/01- 124.2 lbs ? 02/01- 123.2 lbs ? 01/07- 122.2 lbs ? ? ? ?Past Medical History:  ?Diagnosis Date  ? Arthritis   ? Per [place] new patient packet   ? Epilepsy ([place])   ? Per [place] new patient packet   ? Glaucoma   ? Hearing deficit   ? Hearing aids  ? High blood pressure   ? Per [place] new patient packet   ? History of abnormal weight loss   ? Low back pain   ? Memory difficulty 05/09/2016  ? MGUS (monoclonal gammopathy of unknown significance)   ? Neuropathy associated with MGUS ([place]) 11/09/2015  ? Radiculopathy of sacral and sacrococcygeal region   ? S1  ? Seizures ([place])   ? ?Past Surgical History:  ?Procedure Laterality Date  ? APPENDECTOMY    ? BACK SURGERY    ? CATARACT EXTRACTION Left   ? OVARY SURGERY    ? PARATHYROIDECTOMY  9/  ? TRABECULECTOMY Left   ? ? ?Allergies  ?Allergen Reactions  ? Hctz [Hydrochlorothiazide]   ? Other Other (See Comments)  ?  Caused sodium to drop  ? ? ?Outpatient Encounter Medications as of 03/20/2022  ?Medication Sig  ? acetaminophen (TYLENOL) 325 MG tablet Take 650 mg by mouth every 8 (eight) hours as needed.  ? alendronate (FOSAMAX) 70 MG tablet Take 70 mg by mouth once a week. Take with a full glass of water on an empty stomach.  ? bimatoprost (LUMIGAN) 0.03 % ophthalmic solution 1  drop at bedtime.  ? brimonidine (ALPHAGAN) 0.2 % ophthalmic solution Place 1 drop into the right eye 3 (three) times daily. Wait 10 minutes between drops. (Keep eyes closed for 2 minutes after each drop)  ? chlorhexidine (PERIDEX) 0.12 % solution Use as directed 15 mLs in the mouth or throat at bedtime.  ? Cholecalciferol (VITAMIN D3) 50 MCG (2000 UT) TABS Take by mouth.  ? cyanocobalamin 1000 MCG tablet Take 1,000 mcg by mouth in the morning.  ? diclofenac Sodium (VOLTAREN) 1 % GEL Apply 2 g topically as needed (Apply to left hip prn for pain or legft thumb pain).  ? donepezil (ARICEPT) 10 MG tablet  Take 10 mg by mouth at bedtime.  ? dorzolamide-timolol (COSOPT) 22.3-6.8 MG/ML ophthalmic solution Place 1 drop into the right eye 2 (two) times daily. Wait 10 minutes between drops. (Keep eyes closed for 2 minutes after each drop)  ? Eyelid Cleansers (OCUSOFT EYELID CLEANSING) PADS Apply topically. Massage LEFT eyelid/lash line for 3 mins BID ?As Needed  ? lamoTRIgine (LAMICTAL) 100 MG tablet Take 1 tablet in morning, 1 and 1/2 tablets at night  ? memantine (NAMENDA) 5 MG tablet Take 5 mg by mouth daily.  ? Multiple Vitamin (MULTIVITAMIN) tablet Take 1 tablet by mouth daily.  ? pilocarpine (PILOCAR) 4 % ophthalmic solution Place 1 drop into the right eye 4 (four) times daily. Wait 10 minutes between drops. (Keep eyes closed for 2 minutes after each drop)  ? Polyethyl Glycol-Propyl Glycol 0.4-0.3 % SOLN Place 1 drop into the left eye as needed. Wait 10 minutes between drops. (Keep eyes closed for 2 minutes after each drop)  ? ?No facility-administered encounter medications on file as of 03/20/2022.  ? ? ?Review of Systems  ?Constitutional:  Negative for activity change, appetite change, chills, fatigue and fever.  ?HENT:  Negative for congestion and trouble swallowing.   ?Eyes:  Negative for visual disturbance.  ?Respiratory:  Negative for cough, shortness of breath and wheezing.   ?Cardiovascular:  Negative for chest pain and leg swelling.  ?Gastrointestinal:  Negative for abdominal distention, abdominal pain, constipation, diarrhea, nausea and vomiting.  ?Genitourinary:  Positive for frequency. Negative for dysuria and hematuria.  ?Musculoskeletal:  Positive for gait problem. Negative for arthralgias and back pain.  ?Skin:  Negative for rash and wound.  ?Neurological:  Positive for weakness. Negative for dizziness and headaches.  ?Psychiatric/Behavioral:  Positive for confusion. Negative for dysphoric mood and sleep disturbance. The patient is not nervous/anxious.   ? ?Immunization History  ?Administered Date(s)  Administered  ? Influenza, High Dose Seasonal PF 09/26/2015, 09/03/2017, 08/28/2018, 09/23/2020  ? Influenza,inj,Quad PF,6+ Mos 10/01/2019  ? Influenza-Unspecified 09/22/2009, 08/27/2011, 10/08/2012, 12/08/2014, 02/10/2018, 09/06/2021  ? Moderna Sars-Covid-2 Vaccination 12/14/2019, 01/12/2020, 09/20/2020, 10/13/2020  ? Pneumococcal Conjugate-13 12/31/2013  ? Pneumococcal Polysaccharide-23 12/04/1999, 02/02/2016  ? Td 12/04/2000  ? Tdap 11/07/2012  ? ?Pertinent  Health Maintenance Due  ?Topic Date Due  ? INFLUENZA VACCINE  07/03/2022  ? DEXA SCAN  Completed  ? ? ?  05/04/2020  ? 11:05 AM 11/09/2020  ? 10:15 AM 02/10/2021  ? 11:01 AM 05/03/2021  ? 10:14 AM 02/07/2022  ? 11:26 AM  ?Fall Risk  ?Falls in the past year? 0 0 0 0 0  ?Was there an injury with Fall? 0 0 0  0  ?Fall Risk Category Calculator 0 0 0  0  ?Fall Risk Category Low Low Low  Low  ?Patient Fall Risk Level Low fall risk Low fall risk Low  fall risk  Low fall risk  ?Patient at Risk for Falls Due to     No Fall Risks  ?Fall risk Follow up     Falls evaluation completed  ? ?Functional Status Survey: ?  ? ?Vitals:  ? 03/20/22 0909  ?BP: 115/68  ?Pulse: 95  ?Resp: 15  ?Temp: (!) 97 ?F (36.1 ?C)  ?SpO2: 94%  ?Weight: 124 lb 3.2 oz (56.3 kg)  ?Height: '5\' 5"'$  (1.651 m)  ? ?Body mass index is 20.67 kg/m?Marland Kitchen ?Physical Exam ?Vitals reviewed.  ?Constitutional:   ?   General: She is not in acute distress. ?HENT:  ?   Head: Normocephalic.  ?   Right Ear: Tympanic membrane normal. There is no impacted cerumen.  ?   Left Ear: Tympanic membrane normal. There is no impacted cerumen.  ?   Nose: Nose normal.  ?   Mouth/Throat:  ?   Mouth: Mucous membranes are moist.  ?Eyes:  ?   General:     ?   Right eye: No discharge.     ?   Left eye: No discharge.  ?Neck:  ?   Vascular: No carotid bruit.  ?Cardiovascular:  ?   Rate and Rhythm: Normal rate and regular rhythm.  ?   Pulses: Normal pulses.  ?   Heart sounds: Normal heart sounds.  ?Pulmonary:  ?   Effort: Pulmonary effort is normal. No  respiratory distress.  ?   Breath sounds: Normal breath sounds. No wheezing.  ?Abdominal:  ?   General: Bowel sounds are normal. There is no distension.  ?   Palpations: Abdomen is soft.  ?   Tenderness:

## 2022-03-21 ENCOUNTER — Encounter: Payer: Self-pay | Admitting: Orthopedic Surgery

## 2022-03-22 DIAGNOSIS — G308 Other Alzheimer's disease: Secondary | ICD-10-CM | POA: Diagnosis not present

## 2022-03-22 DIAGNOSIS — F01A0 Vascular dementia, mild, without behavioral disturbance, psychotic disturbance, mood disturbance, and anxiety: Secondary | ICD-10-CM | POA: Diagnosis not present

## 2022-03-26 DIAGNOSIS — F01A0 Vascular dementia, mild, without behavioral disturbance, psychotic disturbance, mood disturbance, and anxiety: Secondary | ICD-10-CM | POA: Diagnosis not present

## 2022-03-26 DIAGNOSIS — G308 Other Alzheimer's disease: Secondary | ICD-10-CM | POA: Diagnosis not present

## 2022-03-27 DIAGNOSIS — F01A0 Vascular dementia, mild, without behavioral disturbance, psychotic disturbance, mood disturbance, and anxiety: Secondary | ICD-10-CM | POA: Diagnosis not present

## 2022-03-27 DIAGNOSIS — R278 Other lack of coordination: Secondary | ICD-10-CM | POA: Diagnosis not present

## 2022-03-27 DIAGNOSIS — M6389 Disorders of muscle in diseases classified elsewhere, multiple sites: Secondary | ICD-10-CM | POA: Diagnosis not present

## 2022-03-27 DIAGNOSIS — G308 Other Alzheimer's disease: Secondary | ICD-10-CM | POA: Diagnosis not present

## 2022-03-28 DIAGNOSIS — F01A0 Vascular dementia, mild, without behavioral disturbance, psychotic disturbance, mood disturbance, and anxiety: Secondary | ICD-10-CM | POA: Diagnosis not present

## 2022-03-28 DIAGNOSIS — G308 Other Alzheimer's disease: Secondary | ICD-10-CM | POA: Diagnosis not present

## 2022-03-29 DIAGNOSIS — G308 Other Alzheimer's disease: Secondary | ICD-10-CM | POA: Diagnosis not present

## 2022-03-29 DIAGNOSIS — F01A0 Vascular dementia, mild, without behavioral disturbance, psychotic disturbance, mood disturbance, and anxiety: Secondary | ICD-10-CM | POA: Diagnosis not present

## 2022-03-30 ENCOUNTER — Non-Acute Institutional Stay: Payer: Medicare Other | Admitting: Adult Health

## 2022-03-30 ENCOUNTER — Encounter: Payer: Self-pay | Admitting: Adult Health

## 2022-03-30 DIAGNOSIS — Z0000 Encounter for general adult medical examination without abnormal findings: Secondary | ICD-10-CM | POA: Diagnosis not present

## 2022-03-30 NOTE — Progress Notes (Signed)
? ?Subjective:  ? Tracy Nielsen is a 88 y.o. female who presents for Medicare Annual (Subsequent) preventive examination at [facility].  ? ?Review of Systems    ? ?Cardiac Risk Factors include: advanced age (>83mn, >>21women);sedentary lifestyle ? ?   ?Objective:  ?  ?Today's Vitals  ? 03/30/22 0931  ?Weight: 124 lb 1.6 oz (56.3 kg)  ? ?Body mass index is 20.65 kg/m?. ? ? ?  03/30/2022  ?  9:34 AM 03/20/2022  ?  9:15 AM 02/07/2022  ? 11:27 AM 05/03/2021  ? 10:14 AM 02/10/2021  ? 11:02 AM 11/09/2020  ? 10:16 AM 05/04/2020  ? 11:05 AM  ?Advanced Directives  ?Does Patient Have a Medical Advance Directive? Yes Yes Yes Yes Yes Yes Yes  ?Type of AParamedicof [place]un[place] ResortsLiving will;Out of facility DNR (pink MOST or yellow form) [place]ace[place]NR (pink MOST or yellow form) Healthcare Power of [place] CenterOut of facility DNR (pink MOST or yellow form) H[place] ValleyOut of facility DNR (pink MOST or yellow form) HW[place]aOut of facility DNR (pink MOST or yellow form)  ?Does patient want to make changes to medical advance directive?  No - Patient declined No - Patient declined No - Patient declined No - Patient declined No - Patient declined No - Patient declined  ?Copy of [place] Chart? Yes - validated most recent copy scanned in chart (See row information) Yes - validated most recent copy scanned in chart (See row information)  Yes - validated most recent copy scanned in chart (See row information) Yes - validated most recent copy scanned in chart (See row information) Yes - validated most recent copy scanned in chart (See row information) Yes - validated most recent copy scanned in chart (See row information)  ?Pre-existing out of facility DNR order (yellow form or pink MOST form) Yellow form placed in chart (order not valid  for inpatient use)    Yellow form placed in chart (order not valid for inpatient use) Pink MOST/Yellow Form most recent copy in chart - Physician notified to receive inpatient order Pink MOST/Yellow Form most recent copy in chart - Physician notified to receive inpatient order  ? ? ?Current Medications (verified) ?Outpatient Encounter Medications as of 03/30/2022  ?Medication Sig  ? acetaminophen (TYLENOL) 325 MG tablet Take 650 mg by mouth every 8 (eight) hours as needed.  ? alendronate (FOSAMAX) 70 MG tablet Take 70 mg by mouth once a week. Take with a full glass of water on an empty stomach.  ? bimatoprost (LUMIGAN) 0.03 % ophthalmic solution 1 drop at bedtime.  ? brimonidine (ALPHAGAN) 0.2 % ophthalmic solution Place 1 drop into the right eye 3 (three) times daily. Wait 10 minutes between drops. (Keep eyes closed for 2 minutes after each drop)  ? chlorhexidine (PERIDEX) 0.12 % solution Use as directed 15 mLs in the mouth or throat at bedtime.  ? Cholecalciferol (VITAMIN D3) 50 MCG (2000 UT) TABS Take by mouth.  ? cyanocobalamin 1000 MCG tablet Take 1,000 mcg by mouth in the morning.  ? diclofenac Sodium (VOLTAREN) 1 % GEL Apply 2 g topically as needed (Apply to left hip prn for pain or legft thumb pain).  ? donepezil (ARICEPT) 10 MG tablet Take 10 mg by mouth at bedtime.  ? dorzolamide-timolol (COSOPT) 22.3-6.8 MG/ML ophthalmic solution Place 1 drop into the right eye 2 (  two) times daily. Wait 10 minutes between drops. (Keep eyes closed for 2 minutes after each drop)  ? Eyelid Cleansers (OCUSOFT EYELID CLEANSING) PADS Apply topically. Massage LEFT eyelid/lash line for 3 mins BID ?As Needed  ? lamoTRIgine (LAMICTAL) 100 MG tablet Take 1 tablet in morning, 1 and 1/2 tablets at night  ? memantine (NAMENDA) 5 MG tablet Take 5 mg by mouth daily.  ? Multiple Vitamin (MULTIVITAMIN) tablet Take 1 tablet by mouth daily.  ? pilocarpine (PILOCAR) 4 % ophthalmic solution Place 1 drop into the right eye 4 (four) times daily.  Wait 10 minutes between drops. (Keep eyes closed for 2 minutes after each drop)  ? Polyethyl Glycol-Propyl Glycol 0.4-0.3 % SOLN Place 1 drop into the left eye as needed. Wait 10 minutes between drops. (Keep eyes closed for 2 minutes after each drop)  ? ?No facility-administered encounter medications on file as of 03/30/2022.  ? ? ?Allergies (verified) ?Hctz [hydrochlorothiazide] and Other  ? ?History: ?Past Medical History:  ?Diagnosis Date  ? Arthritis   ? Per [place] new patient packet   ? Epilepsy ([place])   ? Per [place] new patient packet   ? Glaucoma   ? Hearing deficit   ? Hearing aids  ? High blood pressure   ? Per [place] new patient packet   ? History of abnormal weight loss   ? Low back pain   ? Memory difficulty 05/09/2016  ? MGUS (monoclonal gammopathy of unknown significance)   ? 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 Radiculopathy of sacral and sacrococcygeal region   ? S1  ? Seizures ([place])   ? ?Past Surgical History:  ?Procedure Laterality Date  ? APPENDECTOMY    ? BACK SURGERY    ? CATARACT EXTRACTION Left   ? OVARY SURGERY    ? PARATHYROIDECTOMY  9/  ? TRABECULECTOMY Left   ? ?Family History  ?Problem Relation Age of Onset  ? Congestive Heart Failure Mother   ? Parkinsonism Father   ? Heart attack Father   ? Colon cancer Sister   ? Multiple sclerosis Daughter   ? Muscular dystrophy Grandchild   ? Muscular dystrophy Grandchild   ? ?Social History  ? ?Socioeconomic History  ? Marital status: Married  ?  Spouse name: Not on file  ? Number of children: 4  ? Years of education: 87  ? Highest education level: Not on file  ?Occupational History  ? Occupation: Retired  ?Tobacco Use  ? Smoking status: Former  ?  Packs/day: 0.50  ?  Years: 25.00  ?  Pack years: 12.50  ?  Types: Cigarettes  ?  Quit date: 12/03/1973  ?  Years since quitting: 48.3  ? Smokeless tobacco: Never  ?Vaping Use  ? Vaping Use: Never used  ?Substance and Sexual Activity  ? Alcohol use: Yes  ?  Comment: Consumes 2 glasses of wine per day  ?  Drug use: No  ? Sexual activity: Not Currently  ?Other Topics Concern  ? Not on file  ?Social History Narrative  ? Lives at home w/ her husband.  ? Patient drinks about 2 cups of caffeine daily.  ? Patient is right handed.   ?   ? Per [place] Patient Packet, Abstracted 08/26/2019  ? Diet:Normal  ?   ? Caffeine:Yes  ?   ? Married, if yes what year: Widow [phone] 57), remarried 32  ?   ? Do you live in a house, apartment, assisted  living, condo, trailer, ect: Assisted Living ([place]), 1 stories, 1 person  ?   ? Pets: No  ?   ? Current/Past profession: Academic librarian, organize affairs for elderly  ?   ? Exercise: Yes, walking daily   ?   ? Living Will: Yes  ? DNR: No, would like to discuss one--did discuss on 10/9 and DNR form completed, copy made for vynca, and original placed in paper chart; HCPOA, [name], also present and agreeable with his mother-in-law decision  ?   ? POA/HPOA: Yes  ?   ? Functional Status:  ? Do you have difficulty bathing or dressing yourself? no  ? Do you have difficulty preparing food or eating? Prep yes, eating no  ? Do you have difficulty managing your medications? yes  ? Do you have difficulty managing your finances? SIL helps  ? Do you have difficulty affording your medications?  no  ? ?Social Determinants of Health  ? ?Financial Resource Strain: Not on file  ?Food Insecurity: Not on file  ?Transportation Needs: Not on file  ?Physical Activity: Not on file  ?Stress: Not on file  ?Social Connections: Not on file  ? ? ?Tobacco Counseling ?Counseling given: Not Answered ? ? ?Clinical Intake: ? ?  ? ?Pain : No/denies pain ? ?  ? ?BMI - recorded: 20.6 ?Nutritional Status: BMI of 19-24  Normal ?Diabetes: No ? ?How often do you need to have someone help you when you read instructions, pamphlets, or other written materials from your doctor or pharmacy?: 3 - Sometimes ?What is the last grade level you completed in school?:  college ? ?Diabetic?no ? ?Interpreter Needed?: No ? ?Information entered by :: Nasier Thumm NP ? ? ?Activities of Daily Living ? ?  03/30/2022  ?  9:35 AM  ?In your present state of health, do you have any difficulty performin

## 2022-03-30 NOTE — Patient Instructions (Signed)
Tracy Nielsen , ?Thank you for taking time to come for your Medicare Wellness Visit. I appreciate your ongoing commitment to your health goals. Please review the following plan we discussed and let me know if I can assist you in the future.  ? ?Screening recommendations/referrals: ?Colonoscopy aged out  ?Mammogram aged out ?Bone Density due Dec 2023 ?Recommended yearly ophthalmology/optometry visit for glaucoma screening and checkup ?Recommended yearly dental visit for hygiene and checkup ? ?Vaccinations: ?Influenza vaccine up to date ?Pneumococcal vaccine up to date ?Tdap vaccine up to date ?Shingles vaccine recommended and ordered ? ?Covid vaccine recommended, WS to initiate   ? ?Advanced directives: reviewed ? ?Conditions/risks identified: na ? ?Next appointment: 1 year ? ? ?Preventive Care 38 Years and Older, Female ?Preventive care refers to lifestyle choices and visits with your health care provider that can promote health and wellness. ?What does preventive care include? ?A yearly physical exam. This is also called an annual well check. ?Dental exams once or twice a year. ?Routine eye exams. Ask your health care provider how often you should have your eyes checked. ?Personal lifestyle choices, including: ?Daily care of your teeth and gums. ?Regular physical activity. ?Eating a healthy diet. ?Avoiding tobacco and drug use. ?Limiting alcohol use. ?Practicing safe sex. ?Taking low-dose aspirin every day. ?Taking vitamin and mineral supplements as recommended by your health care provider. ?What happens during an annual well check? ?The services and screenings done by your health care provider during your annual well check will depend on your age, overall health, lifestyle risk factors, and family history of disease. ?Counseling  ?Your health care provider may ask you questions about your: ?Alcohol use. ?Tobacco use. ?Drug use. ?Emotional well-being. ?Home and relationship well-being. ?Sexual activity. ?Eating  habits. ?History of falls. ?Memory and ability to understand (cognition). ?Work and work Statistician. ?Reproductive health. ?Screening  ?You may have the following tests or measurements: ?Height, weight, and BMI. ?Blood pressure. ?Lipid and cholesterol levels. These may be checked every 5 years, or more frequently if you are over 89 years old. ?Skin check. ?Lung cancer screening. You may have this screening every year starting at age 20 if you have a 30-pack-year history of smoking and currently smoke or have quit within the past 15 years. ?Fecal occult blood test (FOBT) of the stool. You may have this test every year starting at age 21. ?Flexible sigmoidoscopy or colonoscopy. You may have a sigmoidoscopy every 5 years or a colonoscopy every 10 years starting at age 72. ?Hepatitis C blood test. ?Hepatitis B blood test. ?Sexually transmitted disease (STD) testing. ?Diabetes screening. This is done by checking your blood sugar (glucose) after you have not eaten for a while (fasting). You may have this done every 1-3 years. ?Bone density scan. This is done to screen for osteoporosis. You may have this done starting at age 2. ?Mammogram. This may be done every 1-2 years. Talk to your health care provider about how often you should have regular mammograms. ?Talk with your health care provider about your test results, treatment options, and if necessary, the need for more tests. ?Vaccines  ?Your health care provider may recommend certain vaccines, such as: ?Influenza vaccine. This is recommended every year. ?Tetanus, diphtheria, and acellular pertussis (Tdap, Td) vaccine. You may need a Td booster every 10 years. ?Zoster vaccine. You may need this after age 55. ?Pneumococcal 13-valent conjugate (PCV13) vaccine. One dose is recommended after age 24. ?Pneumococcal polysaccharide (PPSV23) vaccine. One dose is recommended after age 23. ?Talk to  your health care provider about which screenings and vaccines you need and how  often you need them. ?This information is not intended to replace advice given to you by your health care provider. Make sure you discuss any questions you have with your health care provider. ?Document Released: 12/16/2015 Document Revised: 08/08/2016 Document Reviewed: 09/20/2015 ?Elsevier Interactive Patient Education ? 2017 [employer]. ? ?Fall Prevention in the Home ?Falls can cause injuries. They can happen to people of all ages. There are many things you can do to make your home safe and to help prevent falls. ?What can I do on the outside of my home? ?Regularly fix the edges of walkways and driveways and fix any cracks. ?Remove anything that might make you trip as you walk through a door, such as a raised step or threshold. ?Trim any bushes or trees on the path to your home. ?Use bright outdoor lighting. ?Clear any walking paths of anything that might make someone trip, such as rocks or tools. ?Regularly check to see if handrails are loose or broken. Make sure that both sides of any steps have handrails. ?Any raised decks and porches should have guardrails on the edges. ?Have any leaves, snow, or ice cleared regularly. ?Use sand or salt on walking paths during winter. ?Clean up any spills in your garage right away. This includes oil or grease spills. ?What can I do in the bathroom? ?Use night lights. ?Install grab bars by the toilet and in the tub and shower. Do not use towel bars as grab bars. ?Use non-skid mats or decals in the tub or shower. ?If you need to sit down in the shower, use a plastic, non-slip stool. ?Keep the floor dry. Clean up any water that spills on the floor as soon as it happens. ?Remove soap buildup in the tub or shower regularly. ?Attach bath mats securely with double-sided non-slip rug tape. ?Do not have throw rugs and other things on the floor that can make you trip. ?What can I do in the bedroom? ?Use night lights. ?Make sure that you have a light by your bed that is easy to  reach. ?Do not use any sheets or blankets that are too big for your bed. They should not hang down onto the floor. ?Have a firm chair that has side arms. You can use this for support while you get dressed. ?Do not have throw rugs and other things on the floor that can make you trip. ?What can I do in the kitchen? ?Clean up any spills right away. ?Avoid walking on wet floors. ?Keep items that you use a lot in easy-to-reach places. ?If you need to reach something above you, use a strong step stool that has a grab bar. ?Keep electrical cords out of the way. ?Do not use floor polish or wax that makes floors slippery. If you must use wax, use non-skid floor wax. ?Do not have throw rugs and other things on the floor that can make you trip. ?What can I do with my stairs? ?Do not leave any items on the stairs. ?Make sure that there are handrails on both sides of the stairs and use them. Fix handrails that are broken or loose. Make sure that handrails are as long as the stairways. ?Check any carpeting to make sure that it is firmly attached to the stairs. Fix any carpet that is loose or worn. ?Avoid having throw rugs at the top or bottom of the stairs. If you do have throw rugs, attach  them to the floor with carpet tape. ?Make sure that you have a light switch at the top of the stairs and the bottom of the stairs. If you do not have them, ask someone to add them for you. ?What else can I do to help prevent falls? ?Wear shoes that: ?Do not have high heels. ?Have rubber bottoms. ?Are comfortable and fit you well. ?Are closed at the toe. Do not wear sandals. ?If you use a stepladder: ?Make sure that it is fully opened. Do not climb a closed stepladder. ?Make sure that both sides of the stepladder are locked into place. ?Ask someone to hold it for you, if possible. ?Clearly mark and make sure that you can see: ?Any grab bars or handrails. ?First and last steps. ?Where the edge of each step is. ?Use tools that help you move  around (mobility aids) if they are needed. These include: ?Canes. ?Walkers. ?Scooters. ?Crutches. ?Turn on the lights when you go into a dark area. Replace any light bulbs as soon as they burn out. ?Set up your furnitur

## 2022-04-02 DIAGNOSIS — G308 Other Alzheimer's disease: Secondary | ICD-10-CM | POA: Diagnosis not present

## 2022-04-02 DIAGNOSIS — F01A0 Vascular dementia, mild, without behavioral disturbance, psychotic disturbance, mood disturbance, and anxiety: Secondary | ICD-10-CM | POA: Diagnosis not present

## 2022-04-05 DIAGNOSIS — R278 Other lack of coordination: Secondary | ICD-10-CM | POA: Diagnosis not present

## 2022-04-05 DIAGNOSIS — M6389 Disorders of muscle in diseases classified elsewhere, multiple sites: Secondary | ICD-10-CM | POA: Diagnosis not present

## 2022-04-05 DIAGNOSIS — G308 Other Alzheimer's disease: Secondary | ICD-10-CM | POA: Diagnosis not present

## 2022-04-05 DIAGNOSIS — F01A0 Vascular dementia, mild, without behavioral disturbance, psychotic disturbance, mood disturbance, and anxiety: Secondary | ICD-10-CM | POA: Diagnosis not present

## 2022-04-06 DIAGNOSIS — M6389 Disorders of muscle in diseases classified elsewhere, multiple sites: Secondary | ICD-10-CM | POA: Diagnosis not present

## 2022-04-06 DIAGNOSIS — F01A0 Vascular dementia, mild, without behavioral disturbance, psychotic disturbance, mood disturbance, and anxiety: Secondary | ICD-10-CM | POA: Diagnosis not present

## 2022-04-06 DIAGNOSIS — R278 Other lack of coordination: Secondary | ICD-10-CM | POA: Diagnosis not present

## 2022-04-06 DIAGNOSIS — G308 Other Alzheimer's disease: Secondary | ICD-10-CM | POA: Diagnosis not present

## 2022-04-09 DIAGNOSIS — M6389 Disorders of muscle in diseases classified elsewhere, multiple sites: Secondary | ICD-10-CM | POA: Diagnosis not present

## 2022-04-09 DIAGNOSIS — G308 Other Alzheimer's disease: Secondary | ICD-10-CM | POA: Diagnosis not present

## 2022-04-09 DIAGNOSIS — F01A0 Vascular dementia, mild, without behavioral disturbance, psychotic disturbance, mood disturbance, and anxiety: Secondary | ICD-10-CM | POA: Diagnosis not present

## 2022-04-09 DIAGNOSIS — R278 Other lack of coordination: Secondary | ICD-10-CM | POA: Diagnosis not present

## 2022-04-10 DIAGNOSIS — F01A0 Vascular dementia, mild, without behavioral disturbance, psychotic disturbance, mood disturbance, and anxiety: Secondary | ICD-10-CM | POA: Diagnosis not present

## 2022-04-10 DIAGNOSIS — R278 Other lack of coordination: Secondary | ICD-10-CM | POA: Diagnosis not present

## 2022-04-10 DIAGNOSIS — G308 Other Alzheimer's disease: Secondary | ICD-10-CM | POA: Diagnosis not present

## 2022-04-10 DIAGNOSIS — M6389 Disorders of muscle in diseases classified elsewhere, multiple sites: Secondary | ICD-10-CM | POA: Diagnosis not present

## 2022-04-11 DIAGNOSIS — F01A0 Vascular dementia, mild, without behavioral disturbance, psychotic disturbance, mood disturbance, and anxiety: Secondary | ICD-10-CM | POA: Diagnosis not present

## 2022-04-11 DIAGNOSIS — G308 Other Alzheimer's disease: Secondary | ICD-10-CM | POA: Diagnosis not present

## 2022-04-13 DIAGNOSIS — F01A0 Vascular dementia, mild, without behavioral disturbance, psychotic disturbance, mood disturbance, and anxiety: Secondary | ICD-10-CM | POA: Diagnosis not present

## 2022-04-13 DIAGNOSIS — G308 Other Alzheimer's disease: Secondary | ICD-10-CM | POA: Diagnosis not present

## 2022-04-17 DIAGNOSIS — F01A0 Vascular dementia, mild, without behavioral disturbance, psychotic disturbance, mood disturbance, and anxiety: Secondary | ICD-10-CM | POA: Diagnosis not present

## 2022-04-17 DIAGNOSIS — G308 Other Alzheimer's disease: Secondary | ICD-10-CM | POA: Diagnosis not present

## 2022-04-18 DIAGNOSIS — R278 Other lack of coordination: Secondary | ICD-10-CM | POA: Diagnosis not present

## 2022-04-18 DIAGNOSIS — F01A0 Vascular dementia, mild, without behavioral disturbance, psychotic disturbance, mood disturbance, and anxiety: Secondary | ICD-10-CM | POA: Diagnosis not present

## 2022-04-18 DIAGNOSIS — M6389 Disorders of muscle in diseases classified elsewhere, multiple sites: Secondary | ICD-10-CM | POA: Diagnosis not present

## 2022-04-18 DIAGNOSIS — G308 Other Alzheimer's disease: Secondary | ICD-10-CM | POA: Diagnosis not present

## 2022-04-20 DIAGNOSIS — R278 Other lack of coordination: Secondary | ICD-10-CM | POA: Diagnosis not present

## 2022-04-20 DIAGNOSIS — F01A0 Vascular dementia, mild, without behavioral disturbance, psychotic disturbance, mood disturbance, and anxiety: Secondary | ICD-10-CM | POA: Diagnosis not present

## 2022-04-20 DIAGNOSIS — M6389 Disorders of muscle in diseases classified elsewhere, multiple sites: Secondary | ICD-10-CM | POA: Diagnosis not present

## 2022-04-20 DIAGNOSIS — G308 Other Alzheimer's disease: Secondary | ICD-10-CM | POA: Diagnosis not present

## 2022-04-22 DIAGNOSIS — M6389 Disorders of muscle in diseases classified elsewhere, multiple sites: Secondary | ICD-10-CM | POA: Diagnosis not present

## 2022-04-22 DIAGNOSIS — F01A0 Vascular dementia, mild, without behavioral disturbance, psychotic disturbance, mood disturbance, and anxiety: Secondary | ICD-10-CM | POA: Diagnosis not present

## 2022-04-22 DIAGNOSIS — G308 Other Alzheimer's disease: Secondary | ICD-10-CM | POA: Diagnosis not present

## 2022-04-22 DIAGNOSIS — R278 Other lack of coordination: Secondary | ICD-10-CM | POA: Diagnosis not present

## 2022-04-23 DIAGNOSIS — M6389 Disorders of muscle in diseases classified elsewhere, multiple sites: Secondary | ICD-10-CM | POA: Diagnosis not present

## 2022-04-23 DIAGNOSIS — F01A0 Vascular dementia, mild, without behavioral disturbance, psychotic disturbance, mood disturbance, and anxiety: Secondary | ICD-10-CM | POA: Diagnosis not present

## 2022-04-23 DIAGNOSIS — R278 Other lack of coordination: Secondary | ICD-10-CM | POA: Diagnosis not present

## 2022-04-23 DIAGNOSIS — G308 Other Alzheimer's disease: Secondary | ICD-10-CM | POA: Diagnosis not present

## 2022-04-25 DIAGNOSIS — G308 Other Alzheimer's disease: Secondary | ICD-10-CM | POA: Diagnosis not present

## 2022-04-25 DIAGNOSIS — F01A0 Vascular dementia, mild, without behavioral disturbance, psychotic disturbance, mood disturbance, and anxiety: Secondary | ICD-10-CM | POA: Diagnosis not present

## 2022-04-27 DIAGNOSIS — G308 Other Alzheimer's disease: Secondary | ICD-10-CM | POA: Diagnosis not present

## 2022-04-27 DIAGNOSIS — F01A0 Vascular dementia, mild, without behavioral disturbance, psychotic disturbance, mood disturbance, and anxiety: Secondary | ICD-10-CM | POA: Diagnosis not present

## 2022-05-01 DIAGNOSIS — F01A0 Vascular dementia, mild, without behavioral disturbance, psychotic disturbance, mood disturbance, and anxiety: Secondary | ICD-10-CM | POA: Diagnosis not present

## 2022-05-01 DIAGNOSIS — G308 Other Alzheimer's disease: Secondary | ICD-10-CM | POA: Diagnosis not present

## 2022-05-02 DIAGNOSIS — G308 Other Alzheimer's disease: Secondary | ICD-10-CM | POA: Diagnosis not present

## 2022-05-02 DIAGNOSIS — R278 Other lack of coordination: Secondary | ICD-10-CM | POA: Diagnosis not present

## 2022-05-02 DIAGNOSIS — F01A0 Vascular dementia, mild, without behavioral disturbance, psychotic disturbance, mood disturbance, and anxiety: Secondary | ICD-10-CM | POA: Diagnosis not present

## 2022-05-02 DIAGNOSIS — M6389 Disorders of muscle in diseases classified elsewhere, multiple sites: Secondary | ICD-10-CM | POA: Diagnosis not present

## 2022-05-04 DIAGNOSIS — G308 Other Alzheimer's disease: Secondary | ICD-10-CM | POA: Diagnosis not present

## 2022-05-04 DIAGNOSIS — F01A0 Vascular dementia, mild, without behavioral disturbance, psychotic disturbance, mood disturbance, and anxiety: Secondary | ICD-10-CM | POA: Diagnosis not present

## 2022-05-07 DIAGNOSIS — G308 Other Alzheimer's disease: Secondary | ICD-10-CM | POA: Diagnosis not present

## 2022-05-07 DIAGNOSIS — F01A0 Vascular dementia, mild, without behavioral disturbance, psychotic disturbance, mood disturbance, and anxiety: Secondary | ICD-10-CM | POA: Diagnosis not present

## 2022-05-08 DIAGNOSIS — G308 Other Alzheimer's disease: Secondary | ICD-10-CM | POA: Diagnosis not present

## 2022-05-08 DIAGNOSIS — R278 Other lack of coordination: Secondary | ICD-10-CM | POA: Diagnosis not present

## 2022-05-08 DIAGNOSIS — F01A0 Vascular dementia, mild, without behavioral disturbance, psychotic disturbance, mood disturbance, and anxiety: Secondary | ICD-10-CM | POA: Diagnosis not present

## 2022-05-08 DIAGNOSIS — M6389 Disorders of muscle in diseases classified elsewhere, multiple sites: Secondary | ICD-10-CM | POA: Diagnosis not present

## 2022-05-11 DIAGNOSIS — G308 Other Alzheimer's disease: Secondary | ICD-10-CM | POA: Diagnosis not present

## 2022-05-11 DIAGNOSIS — F01A0 Vascular dementia, mild, without behavioral disturbance, psychotic disturbance, mood disturbance, and anxiety: Secondary | ICD-10-CM | POA: Diagnosis not present

## 2022-05-14 ENCOUNTER — Encounter: Payer: Self-pay | Admitting: Internal Medicine

## 2022-05-14 ENCOUNTER — Non-Acute Institutional Stay (SKILLED_NURSING_FACILITY): Payer: Medicare Other | Admitting: Internal Medicine

## 2022-05-14 DIAGNOSIS — E538 Deficiency of other specified B group vitamins: Secondary | ICD-10-CM | POA: Diagnosis not present

## 2022-05-14 DIAGNOSIS — I10 Essential (primary) hypertension: Secondary | ICD-10-CM | POA: Diagnosis not present

## 2022-05-14 DIAGNOSIS — M6389 Disorders of muscle in diseases classified elsewhere, multiple sites: Secondary | ICD-10-CM | POA: Diagnosis not present

## 2022-05-14 DIAGNOSIS — G40309 Generalized idiopathic epilepsy and epileptic syndromes, not intractable, without status epilepticus: Secondary | ICD-10-CM

## 2022-05-14 DIAGNOSIS — R278 Other lack of coordination: Secondary | ICD-10-CM | POA: Diagnosis not present

## 2022-05-14 DIAGNOSIS — F01A0 Vascular dementia, mild, without behavioral disturbance, psychotic disturbance, mood disturbance, and anxiety: Secondary | ICD-10-CM | POA: Diagnosis not present

## 2022-05-14 DIAGNOSIS — M810 Age-related osteoporosis without current pathological fracture: Secondary | ICD-10-CM

## 2022-05-14 DIAGNOSIS — F0280 Dementia in other diseases classified elsewhere without behavioral disturbance: Secondary | ICD-10-CM

## 2022-05-14 DIAGNOSIS — G309 Alzheimer's disease, unspecified: Secondary | ICD-10-CM | POA: Diagnosis not present

## 2022-05-14 DIAGNOSIS — G308 Other Alzheimer's disease: Secondary | ICD-10-CM | POA: Diagnosis not present

## 2022-05-14 DIAGNOSIS — F0150 Vascular dementia without behavioral disturbance: Secondary | ICD-10-CM

## 2022-05-14 NOTE — Progress Notes (Signed)
Provider:  Veleta Miners MD Location:    [place] Room Number: 505 Place of Service:  SNF ([phone])  PCP: [name], MD Patient Care Team: [name], MD as PCP - General (Internal Medicine) [name], MD (Inactive) as Consulting Physician (Neurology)  Extended Emergency Contact Information Primary Emergency Contact: Lynn Phone: [phone] Mobile Phone: [phone] Relation: Relative  Code Status: DNR Goals of Care: Advanced Directive information    05/14/2022   11:08 AM  Advanced Directives  Does Patient Have a Medical Advance Directive? Yes  Type of Paramedic of [place];Living will;Out of facility DNR (pink MOST or yellow form)  Does patient want to make changes to medical advance directive? No - Patient declined  Copy of [place] in Chart? Yes - validated most recent copy scanned in chart (See row information)  Pre-existing out of facility DNR order (yellow form or pink MOST form) Yellow form placed in chart (order not valid for inpatient use)      Chief Complaint  Patient presents with   New Admit To SNF    Admission to SNF    HPI: Patient is a 88 y.o. female seen today for admission to Memory Unit  Has h/o Alzheimer Dementia, h/o Seizure disorder and Osteoarthritis and Hypertension H/o Peripheral Neuropathy   Patient was living in [place] but was having issues with her ADLS  Getting agitated with staff And now has moved to Memory unit Per nurses she is doing well Walks with no assist She had no New complains  Past Medical History:  Diagnosis Date   Arthritis    Per [place] new patient packet    Epilepsy ([place])    Per [place] new patient packet    Glaucoma    Hearing deficit    Hearing aids   High blood pressure    Per [place] new patient packet    History of abnormal weight loss    Low back pain    Memory difficulty 05/09/2016   MGUS (monoclonal  gammopathy of unknown significance)    Neuropathy associated with MGUS ([place]) 11/09/2015   Radiculopathy of sacral and sacrococcygeal region    S1   Seizures ([place])    Past Surgical History:  Procedure Laterality Date   APPENDECTOMY     BACK SURGERY     CATARACT EXTRACTION Left    OVARY SURGERY     PARATHYROIDECTOMY  9/   TRABECULECTOMY Left     reports that she quit smoking about 48 years ago. Her smoking use included cigarettes. She has a 12.50 pack-year smoking history. She has never used smokeless tobacco. She reports current alcohol use. She reports that she does not use drugs. Social History   Socioeconomic History   Marital status: Married    Spouse name: Not on file   Number of children: 4   Years of education: 14   Highest education level: Not on file  Occupational History   Occupation: Retired  Tobacco Use   Smoking status: Former    Packs/day: 0.50    Years: 25.00    Total pack years: 12.50    Types: Cigarettes    Quit date: 12/03/1973    Years since quitting: 48.4   Smokeless tobacco: Never  Vaping Use   Vaping Use: Never used  Substance and Sexual Activity   Alcohol use: Yes    Comment: Consumes 2 glasses of wine per day   Drug use: No  Sexual activity: Not Currently  Other Topics Concern   Not on file  Social History Narrative   Lives at home w/ her husband.   Patient drinks about 2 cups of caffeine daily.   Patient is right handed.       Per [place] Patient Packet, Abstracted 08/26/2019   Diet:Normal      Caffeine:Yes      Married, if yes what year: Widow 70 (div 80), remarried 1988      Do you live in a house, apartment, assisted living, condo, trailer, ect: Assisted Living ([facility]), 1 stories, 1 person      Pets: No      Current/Past profession: Academic librarian, organize affairs for elderly      Exercise: Yes, walking daily       Living Will: Yes   DNR: No, would like to discuss one--did  discuss on 10/9 and DNR form completed, copy made for vynca, and original placed in paper chart; HCPOA, [name], also present and agreeable with his mother-in-law decision      POA/HPOA: Yes      Functional Status:   Do you have difficulty bathing or dressing yourself? no   Do you have difficulty preparing food or eating? Prep yes, eating no   Do you have difficulty managing your medications? yes   Do you have difficulty managing your finances? SIL helps   Do you have difficulty affording your medications?  no   Social Determinants of Health   Financial Resource Strain: Not on file  Food Insecurity: Not on file  Transportation Needs: Not on file  Physical Activity: Not on file  Stress: Not on file  Social Connections: Not on file  Intimate Partner Violence: Not on file    Functional Status Survey:    Family History  Problem Relation Age of Onset   Congestive Heart Failure Mother    Parkinsonism Father    Heart attack Father    Colon cancer Sister    Multiple sclerosis Daughter    Muscular dystrophy Grandchild    Muscular dystrophy Grandchild     Health Maintenance  Topic Date Due   Zoster Vaccines- Shingrix (1 of 2) Never done   COVID-19 Vaccine (5 - Moderna series) 12/08/2020   INFLUENZA VACCINE  07/03/2022   TETANUS/TDAP  11/07/2022   Pneumonia Vaccine 36+ Years old  Completed   DEXA SCAN  Completed   HPV VACCINES  Aged Out    Allergies  Allergen Reactions   Hctz [Hydrochlorothiazide]    Other Other (See Comments)    Caused sodium to drop    Allergies as of 05/14/2022       Reactions   Hctz [hydrochlorothiazide]    Other Other (See Comments)   Caused sodium to drop        Medication List        Accurate as of May 14, 2022 11:08 AM. If you have any questions, ask your nurse or doctor.          acetaminophen 325 MG tablet Commonly known as: TYLENOL Take 650 mg by mouth every 8 (eight) hours as needed.   alendronate 70 MG tablet Commonly  known as: FOSAMAX Take 70 mg by mouth once a week. Take with a full glass of water on an empty stomach.   bimatoprost 0.03 % ophthalmic solution Commonly known as: LUMIGAN 1 drop at bedtime.   brimonidine 0.2 % ophthalmic solution Commonly known as: ALPHAGAN Place 1  drop into the right eye 3 (three) times daily. Wait 10 minutes between drops. (Keep eyes closed for 2 minutes after each drop)   chlorhexidine 0.12 % solution Commonly known as: PERIDEX Use as directed 15 mLs in the mouth or throat at bedtime.   cyanocobalamin 1000 MCG tablet Take 1,000 mcg by mouth in the morning.   diclofenac Sodium 1 % Gel Commonly known as: VOLTAREN Apply 2 g topically as needed (Apply to left hip prn for pain or legft thumb pain).   donepezil 10 MG tablet Commonly known as: ARICEPT Take 10 mg by mouth at bedtime.   dorzolamide-timolol 22.3-6.8 MG/ML ophthalmic solution Commonly known as: COSOPT Place 1 drop into the right eye 2 (two) times daily. Wait 10 minutes between drops. (Keep eyes closed for 2 minutes after each drop)   lamoTRIgine 100 MG tablet Commonly known as: LAMICTAL Take 1 tablet in morning, 1 and 1/2 tablets at night   memantine 5 MG tablet Commonly known as: NAMENDA Take 5 mg by mouth daily.   multivitamin tablet Take 1 tablet by mouth daily.   OcuSoft Eyelid Cleansing Pads Apply topically. Massage LEFT eyelid/lash line for 3 mins BID As Needed   pilocarpine 4 % ophthalmic solution Commonly known as: PILOCAR Place 1 drop into the right eye 4 (four) times daily. Wait 10 minutes between drops. (Keep eyes closed for 2 minutes after each drop)   Polyethyl Glycol-Propyl Glycol 0.4-0.3 % Soln Place 1 drop into the left eye as needed. Wait 10 minutes between drops. (Keep eyes closed for 2 minutes after each drop)   Vitamin D3 50 MCG (2000 UT) Tabs Take by mouth.        Review of Systems  Unable to perform ROS: Dementia    Vitals:   05/14/22 1049  BP: 108/76   Pulse: 84  Resp: 16  Temp: (!) 97.5 F (36.4 C)  SpO2: 98%  Weight: 127 lb 12.8 oz (58 kg)  Height: '5\' 5"'$  (1.651 m)   Body mass index is 21.27 kg/m. Physical Exam Vitals reviewed.  Constitutional:      Appearance: Normal appearance.  HENT:     Head: Normocephalic.     Nose: Nose normal.     Mouth/Throat:     Mouth: Mucous membranes are moist.     Pharynx: Oropharynx is clear.  Eyes:     Pupils: Pupils are equal, round, and reactive to light.  Cardiovascular:     Rate and Rhythm: Normal rate and regular rhythm.     Pulses: Normal pulses.     Heart sounds: Normal heart sounds. No murmur heard. Pulmonary:     Effort: Pulmonary effort is normal.     Breath sounds: Normal breath sounds.  Abdominal:     General: Abdomen is flat. Bowel sounds are normal.     Palpations: Abdomen is soft.  Musculoskeletal:        General: No swelling.     Cervical back: Neck supple.  Skin:    General: Skin is warm.  Neurological:     General: No focal deficit present.     Mental Status: She is alert.  Psychiatric:        Mood and Affect: Mood normal.        Thought Content: Thought content normal.       03/30/2022    9:37 AM 02/07/2022   11:16 AM 12/01/2019    9:58 AM  MMSE - Mini Mental State Exam  Orientation to time 1  4 2  Orientation to Place '5 4 5  '$ Registration '3 3 3  '$ Attention/ Calculation 0 5 5  Recall 0 1 0  Language- name 2 objects '2 2 2  '$ Language- repeat '1 1 1  '$ Language- follow 3 step command '3 3 3  '$ Language- read & follow direction '1 1 1  '$ Write a sentence '1 1 1  '$ Copy design '1 1 1  '$ Total score '18 26 24      '$ Labs reviewed: Basic Metabolic Panel: Recent Labs    11/07/21 0000  NA 140  K 3.6  CL 104  CO2 21  BUN 15  CREATININE 0.9  CALCIUM 8.5*   Liver Function Tests: Recent Labs    11/07/21 0000  AST 24  ALT 15  ALKPHOS 97  ALBUMIN 3.9   No results for input(s): "LIPASE", "AMYLASE" in the last 8760 hours. No results for input(s): "AMMONIA" in the  last 8760 hours. CBC: Recent Labs    07/14/21 0000 11/07/21 0000  WBC 5.1 4.7  HGB 11.3* 12.1  HCT 29* 35*  PLT 196 138*   Cardiac Enzymes: No results for input(s): "CKTOTAL", "CKMB", "CKMBINDEX", "TROPONINI" in the last 8760 hours. BNP: Invalid input(s): "POCBNP" No results found for: "HGBA1C" Lab Results  Component Value Date   TSH 1.91 11/21/2021   Lab Results  Component Value Date   VITAMINB12 295 11/07/2021   Lab Results  Component Value Date   FOLATE > 20.0 ng/mL 10/13/2007   Lab Results  Component Value Date   IRON 172 (H) 10/13/2007   FERRITIN 31.5 10/13/2007    Imaging and Procedures obtained prior to SNF admission: DG BONE DENSITY (DXA)  Result Date: 11/29/2020 EXAM: DUAL X-RAY ABSORPTIOMETRY (DXA) FOR BONE MINERAL DENSITY IMPRESSION: Referring Physician:  [place] Your patient completed a BMD test using Lunar IDXA DXA system ( analysis version: 16 ) manufactured by [employer]. Technologist: AD PATIENT: Name: Kristyne, Woodring Patient ID: [id number] Birth Date: [birth year] Height: 64.5 in. Sex: Female Measured: 11/29/2020 Weight: 123.0 lbs. Indications: Advanced Age, Bilateral Ovariectomy (65.51), Caucasian, Estrogen Deficient, Hysterectomy, Osteoporosis (733) Fractures: Lt forearm Treatments: Calcium (E943.0), Vitamin D (E933.5) ASSESSMENT: The BMD measured at Forearm Radius 33% is 0.428 g/cm2 with a T-score of -5.2. This patient is considered osteoporotic according to [facility]) criteria. The scan quality is limited by patient unable to remove jewelry for exam. Lumbar spine was not utilized due to degenerative changes. Site Region Measured Date Measured Age YA BMD Significant CHANGE T-score Right Forearm Radius 33% 11/29/2020 88.8 -5.2 0.428 g/cm2 DualFemur Total Right 11/29/2020 88.8 -3.0 0.630 g/cm2 DualFemur Total Mean 11/29/2020 88.8 -2.9 0.637 g/cm2 World Health Organization [facility]) criteria for post-menopausal, Caucasian Women:  Normal       T-score at or above -1 SD Osteopenia   T-score between -1 and -2.5 SD Osteoporosis T-score at or below -2.5 SD RECOMMENDATION: 1. All patients should optimize calcium and vitamin D intake. 2. Consider FDA approved medical therapies in postmenopausal women and men aged 18 years and older, based on the following: a. A hip or vertebral (clinical or morphometric) fracture b. T- score < or = -2.5 at the femoral neck or spine after appropriate evaluation to exclude secondary causes c. Low bone mass (T-score between -1.0 and -2.5 at the femoral neck or spine) and a 10 year probability of a hip fracture > or = 3% or a 10 year probability of a major osteoporosis-related fracture > or = 20% based on  the [place]-adapted WHO algorithm d. Clinician judgment and/or patient preferences may indicate treatment for people with 10-year fracture probabilities above or below these levels FOLLOW-UP: Patients with diagnosis of osteoporosis or at high risk for fracture should have regular bone mineral density tests. For patients eligible for Medicare, routine testing is allowed once every 2 years. The testing frequency can be increased to one year for patients who have rapidly progressing disease, those who are receiving or discontinuing medical therapy to restore bone mass, or have additional risk factors. I have reviewed this report and agree with the above findings. [facility] Radiology Electronically Signed   By: Lowella Grip III M.D.   On: 11/29/2020 11:29    Assessment/Plan 1. Mixed Alzheimer's and vascular dementia ([place]) MMSE Dropped from 26 to 18 /30  Which is steep drop She is already on Aricept Increase Namenda to '5mg'$  BID  2. Essential hypertension Off all meds BP good 3. Generalized convulsive epilepsy without intractable epilepsy ([place]) On Lamictal  4. Senile osteoporosis Started on Fosamax T score -5.  Already on Vit d   5. Vitamin B 12 deficiency On Supplement    Family/ staff  Communication:   Labs/tests ordered:

## 2022-05-16 ENCOUNTER — Non-Acute Institutional Stay (SKILLED_NURSING_FACILITY): Payer: Medicare Other | Admitting: Orthopedic Surgery

## 2022-05-16 ENCOUNTER — Encounter: Payer: Self-pay | Admitting: Orthopedic Surgery

## 2022-05-16 DIAGNOSIS — F0280 Dementia in other diseases classified elsewhere without behavioral disturbance: Secondary | ICD-10-CM

## 2022-05-16 DIAGNOSIS — G308 Other Alzheimer's disease: Secondary | ICD-10-CM | POA: Diagnosis not present

## 2022-05-16 DIAGNOSIS — G309 Alzheimer's disease, unspecified: Secondary | ICD-10-CM | POA: Diagnosis not present

## 2022-05-16 DIAGNOSIS — W19XXXA Unspecified fall, initial encounter: Secondary | ICD-10-CM

## 2022-05-16 DIAGNOSIS — S0083XA Contusion of other part of head, initial encounter: Secondary | ICD-10-CM

## 2022-05-16 DIAGNOSIS — S99921A Unspecified injury of right foot, initial encounter: Secondary | ICD-10-CM

## 2022-05-16 DIAGNOSIS — F0150 Vascular dementia without behavioral disturbance: Secondary | ICD-10-CM

## 2022-05-16 DIAGNOSIS — F01A0 Vascular dementia, mild, without behavioral disturbance, psychotic disturbance, mood disturbance, and anxiety: Secondary | ICD-10-CM | POA: Diagnosis not present

## 2022-05-16 NOTE — Progress Notes (Signed)
Location:  [place] Room Number: 315/A Place of Service:  SNF [phone]) Provider: Yvonna Alanis, NP  Patient Care Team: [name], MD as PCP - General (Internal Medicine) [name], MD (Inactive) as Consulting Physician (Neurology)  Extended Emergency Contact Information Primary Emergency Contact: [place] Phone: [phone] Mobile Phone: [phone] Relation: Relative  Code Status:  DNR Goals of care: Advanced Directive information    05/16/2022    1:41 PM  Advanced Directives  Does Patient Have a Medical Advance Directive? Yes  Type of Paramedic of [place];Living will;Out of facility DNR (pink MOST or yellow form)  Does patient want to make changes to medical advance directive? No - Patient declined  Copy of [place] in Chart? Yes - validated most recent copy scanned in chart (See row information)  Pre-existing out of facility DNR order (yellow form or pink MOST form) Yellow form placed in chart (order not valid for inpatient use)     Chief Complaint  Patient presents with   Acute Visit    Fall    HPI:  Pt is a 88 y.o. female seen today for an acute visit due to fall.   She currently resides on the memory care unit at [facility] due to [place]. 06/13 suspected unwitnessed fall. Evening nurse noticed a bruise to right forehead. She also had increased bruising to 2nd and 3rd toes on right foot. Neuro checks have been stable. She is able to move all extremities without difficulty. Denies pain to right foot, able to move toes. She was given tylenol 650 mg last night, but has not needed any today. Vitals stable.    Past Medical History:  Diagnosis Date   Arthritis    Per [place] new patient packet    Epilepsy [facility])    Per [place] new patient packet    Glaucoma    Hearing deficit    Hearing aids   High blood pressure    Per [facility] new patient packet    History of abnormal weight  loss    Low back pain    Memory difficulty 05/09/2016   MGUS (monoclonal gammopathy of unknown significance)    Neuropathy associated with MGUS ([place]) 11/09/2015   Radiculopathy of sacral and sacrococcygeal region    S1   Seizures ([place])    Past Surgical History:  Procedure Laterality Date   APPENDECTOMY     BACK SURGERY     CATARACT EXTRACTION Left    OVARY SURGERY     PARATHYROIDECTOMY  9/   TRABECULECTOMY Left     Allergies  Allergen Reactions   Hctz [Hydrochlorothiazide]    Other Other (See Comments)    Caused sodium to drop    Outpatient Encounter Medications as of 05/16/2022  Medication Sig   acetaminophen (TYLENOL) 325 MG tablet Take 650 mg by mouth every 8 (eight) hours as needed.   alendronate (FOSAMAX) 70 MG tablet Take 70 mg by mouth once a week. Take with a full glass of water on an empty stomach.   bimatoprost (LUMIGAN) 0.03 % ophthalmic solution 1 drop at bedtime.   brimonidine (ALPHAGAN) 0.2 % ophthalmic solution Place 1 drop into the right eye 3 (three) times daily. Wait 10 minutes between drops. (Keep eyes closed for 2 minutes after each drop)   chlorhexidine (PERIDEX) 0.12 % solution Use as directed 15 mLs in the mouth or throat at bedtime.   Cholecalciferol (VITAMIN D3) 50 MCG (2000 UT) TABS  Take by mouth.   cyanocobalamin 1000 MCG tablet Take 1,000 mcg by mouth in the morning.   diclofenac Sodium (VOLTAREN) 1 % GEL Apply 2 g topically as needed (Apply to left hip prn for pain or legft thumb pain).   donepezil (ARICEPT) 10 MG tablet Take 10 mg by mouth at bedtime.   dorzolamide-timolol (COSOPT) 22.3-6.8 MG/ML ophthalmic solution Place 1 drop into the right eye 2 (two) times daily. Wait 10 minutes between drops. (Keep eyes closed for 2 minutes after each drop)   Eyelid Cleansers (OCUSOFT EYELID CLEANSING) PADS Apply topically. Massage LEFT eyelid/lash line for 3 mins BID As Needed   lamoTRIgine (LAMICTAL) 100 MG tablet Take 1 tablet in morning, 1 and 1/2 tablets at  night   memantine (NAMENDA) 5 MG tablet Take 5 mg by mouth daily.   Multiple Vitamin (MULTIVITAMIN) tablet Take 1 tablet by mouth daily.   pilocarpine (PILOCAR) 4 % ophthalmic solution Place 1 drop into the right eye 4 (four) times daily. Wait 10 minutes between drops. (Keep eyes closed for 2 minutes after each drop)   Polyethyl Glycol-Propyl Glycol 0.4-0.3 % SOLN Place 1 drop into the left eye as needed. Wait 10 minutes between drops. (Keep eyes closed for 2 minutes after each drop)   No facility-administered encounter medications on file as of 05/16/2022.    Review of Systems  Unable to perform ROS: Dementia    Immunization History  Administered Date(s) Administered   Influenza, High Dose Seasonal PF 09/26/2015, 09/03/2017, 08/28/2018, 09/23/2020   Influenza,inj,Quad PF,6+ Mos 10/01/2019   Influenza-Unspecified 09/22/2009, 08/27/2011, 10/08/2012, 12/08/2014, 02/10/2018, 09/06/2021   Moderna Sars-Covid-2 Vaccination 12/14/2019, 01/12/2020, 09/20/2020, 10/13/2020   Pneumococcal Conjugate-13 12/31/2013   Pneumococcal Polysaccharide-23 12/04/1999, 02/02/2016   Td 12/04/2000   Tdap 11/07/2012   Pertinent  Health Maintenance Due  Topic Date Due   INFLUENZA VACCINE  07/03/2022   DEXA SCAN  Completed      11/09/2020   10:15 AM 02/10/2021   11:01 AM 05/03/2021   10:14 AM 02/07/2022   11:26 AM 03/30/2022    9:35 AM  Fall Risk  Falls in the past year? 0 0 0 0 0  Was there an injury with Fall? 0 0  0 0  Fall Risk Category Calculator 0 0  0 0  Fall Risk Category Low Low  Low Low  Patient Fall Risk Level Low fall risk Low fall risk  Low fall risk Low fall risk  Patient at Risk for Falls Due to    No Fall Risks   Fall risk Follow up    Falls evaluation completed    Functional Status Survey:    Vitals:   05/16/22 1335  BP: 116/74  Pulse: 86  Resp: 16  Temp: 97.8 F (36.6 C)  SpO2: 95%  Weight: 127 lb 12.8 oz (58 kg)  Height: '5\' 5"'$  (1.651 m)   Body mass index is 21.27  kg/m. Physical Exam Vitals reviewed.  Constitutional:      General: She is not in acute distress. HENT:     Head: Normocephalic. No raccoon eyes or Battle's sign.     Comments: Quarter sized sized bruise to right upper forehead, purple/bluish in color, mild swelling, tender to touch, no skin breakdown    Right Ear: Tympanic membrane normal.     Left Ear: Tympanic membrane normal.     Nose: Nose normal.     Mouth/Throat:     Mouth: Mucous membranes are moist.  Eyes:  General:        Right eye: No discharge.        Left eye: No discharge.     Extraocular Movements: Extraocular movements intact.     Right eye: Normal extraocular motion and no nystagmus.     Left eye: Normal extraocular motion and no nystagmus.     Pupils: Pupils are equal, round, and reactive to light.  Cardiovascular:     Rate and Rhythm: Normal rate and regular rhythm.     Pulses: Normal pulses.     Heart sounds: Normal heart sounds.  Pulmonary:     Effort: Pulmonary effort is normal. No respiratory distress.     Breath sounds: Normal breath sounds. No wheezing.  Abdominal:     General: Bowel sounds are normal. There is no distension.     Palpations: Abdomen is soft.     Tenderness: There is no abdominal tenderness.  Musculoskeletal:     Cervical back: Neck supple.     Right lower leg: No edema.     Left lower leg: No edema.     Right foot: Normal range of motion. No swelling, deformity or crepitus.     Comments: Able to bear weight on both feet, able to move all extremities without difficulty  Skin:    General: Skin is warm and dry.     Capillary Refill: Capillary refill takes less than 2 seconds.     Comments: 2nd and 3rd toes to right foot blue/purple in color, no skin breakdown  Neurological:     General: No focal deficit present.     Mental Status: She is alert. Mental status is at baseline.     Motor: Weakness present.     Gait: Gait abnormal.     Comments: walker  Psychiatric:        Mood  and Affect: Mood normal.        Behavior: Behavior normal.        Cognition and Memory: Cognition is impaired. Memory is impaired.     Comments: Follows commands, very pleasant, alert to self and familiar face     Labs reviewed: Recent Labs    11/07/21 0000  NA 140  K 3.6  CL 104  CO2 21  BUN 15  CREATININE 0.9  CALCIUM 8.5*   Recent Labs    11/07/21 0000  AST 24  ALT 15  ALKPHOS 97  ALBUMIN 3.9   Recent Labs    07/14/21 0000 11/07/21 0000  WBC 5.1 4.7  HGB 11.3* 12.1  HCT 29* 35*  PLT 196 138*   Lab Results  Component Value Date   TSH 1.91 11/21/2021   No results found for: "HGBA1C" Lab Results  Component Value Date   CHOL 241 (A) 09/10/2019   HDL 99 (A) 09/10/2019   LDLCALC 128 09/10/2019   TRIG 72 09/10/2019    Significant Diagnostic Results in last 30 days:  No results found.  Assessment/Plan 1. Fall, initial encounter - suspected unwitnessed fall 06/13 - forehead contusion and bruising to 2nd/3rd toes on right foot - stable neuro checks - cont memory care - cont falls safety precautions  2. Forehead contusion, initial encounter - quarter sized bruise to right upper forehead - exam unremarkable - cont neuro checks  3. Toe injury, right, initial encounter - involved 2nd and 3rd toes - able to bear weight on right foot, move toes - no pain today, ambulating well - consider xray if right foot pain/toe pain occurs -  cont tylenol prn  4. Mixed Alzheimer's and vascular dementia ([place]) - no behavioral outbursts - MMSE 26/30 11/2021 - cont Namenda - cont memory care    Family/ staff Communication: plan discussed with patient and nurse  Labs/tests ordered:  none

## 2022-05-21 DIAGNOSIS — G308 Other Alzheimer's disease: Secondary | ICD-10-CM | POA: Diagnosis not present

## 2022-05-21 DIAGNOSIS — F01A0 Vascular dementia, mild, without behavioral disturbance, psychotic disturbance, mood disturbance, and anxiety: Secondary | ICD-10-CM | POA: Diagnosis not present

## 2022-05-22 DIAGNOSIS — G308 Other Alzheimer's disease: Secondary | ICD-10-CM | POA: Diagnosis not present

## 2022-05-22 DIAGNOSIS — M6389 Disorders of muscle in diseases classified elsewhere, multiple sites: Secondary | ICD-10-CM | POA: Diagnosis not present

## 2022-05-22 DIAGNOSIS — R278 Other lack of coordination: Secondary | ICD-10-CM | POA: Diagnosis not present

## 2022-05-22 DIAGNOSIS — F01A0 Vascular dementia, mild, without behavioral disturbance, psychotic disturbance, mood disturbance, and anxiety: Secondary | ICD-10-CM | POA: Diagnosis not present

## 2022-05-23 DIAGNOSIS — F01A0 Vascular dementia, mild, without behavioral disturbance, psychotic disturbance, mood disturbance, and anxiety: Secondary | ICD-10-CM | POA: Diagnosis not present

## 2022-05-23 DIAGNOSIS — G308 Other Alzheimer's disease: Secondary | ICD-10-CM | POA: Diagnosis not present

## 2022-05-28 DIAGNOSIS — F01A0 Vascular dementia, mild, without behavioral disturbance, psychotic disturbance, mood disturbance, and anxiety: Secondary | ICD-10-CM | POA: Diagnosis not present

## 2022-05-28 DIAGNOSIS — G308 Other Alzheimer's disease: Secondary | ICD-10-CM | POA: Diagnosis not present

## 2022-05-30 DIAGNOSIS — F01A0 Vascular dementia, mild, without behavioral disturbance, psychotic disturbance, mood disturbance, and anxiety: Secondary | ICD-10-CM | POA: Diagnosis not present

## 2022-05-30 DIAGNOSIS — R278 Other lack of coordination: Secondary | ICD-10-CM | POA: Diagnosis not present

## 2022-05-30 DIAGNOSIS — M6389 Disorders of muscle in diseases classified elsewhere, multiple sites: Secondary | ICD-10-CM | POA: Diagnosis not present

## 2022-05-30 DIAGNOSIS — G308 Other Alzheimer's disease: Secondary | ICD-10-CM | POA: Diagnosis not present

## 2022-06-04 ENCOUNTER — Encounter: Payer: Medicare Other | Admitting: Adult Health

## 2022-06-07 DIAGNOSIS — G308 Other Alzheimer's disease: Secondary | ICD-10-CM | POA: Diagnosis not present

## 2022-06-07 DIAGNOSIS — F01A0 Vascular dementia, mild, without behavioral disturbance, psychotic disturbance, mood disturbance, and anxiety: Secondary | ICD-10-CM | POA: Diagnosis not present

## 2022-06-07 DIAGNOSIS — R278 Other lack of coordination: Secondary | ICD-10-CM | POA: Diagnosis not present

## 2022-06-07 DIAGNOSIS — M6389 Disorders of muscle in diseases classified elsewhere, multiple sites: Secondary | ICD-10-CM | POA: Diagnosis not present

## 2022-06-08 ENCOUNTER — Encounter: Payer: Self-pay | Admitting: Adult Health

## 2022-06-08 ENCOUNTER — Non-Acute Institutional Stay (SKILLED_NURSING_FACILITY): Payer: Medicare Other | Admitting: Adult Health

## 2022-06-08 DIAGNOSIS — M810 Age-related osteoporosis without current pathological fracture: Secondary | ICD-10-CM | POA: Diagnosis not present

## 2022-06-08 DIAGNOSIS — H401122 Primary open-angle glaucoma, left eye, moderate stage: Secondary | ICD-10-CM | POA: Diagnosis not present

## 2022-06-08 DIAGNOSIS — E042 Nontoxic multinodular goiter: Secondary | ICD-10-CM | POA: Diagnosis not present

## 2022-06-08 DIAGNOSIS — F0150 Vascular dementia without behavioral disturbance: Secondary | ICD-10-CM

## 2022-06-08 DIAGNOSIS — G309 Alzheimer's disease, unspecified: Secondary | ICD-10-CM

## 2022-06-08 DIAGNOSIS — F0280 Dementia in other diseases classified elsewhere without behavioral disturbance: Secondary | ICD-10-CM

## 2022-06-08 DIAGNOSIS — G40309 Generalized idiopathic epilepsy and epileptic syndromes, not intractable, without status epilepticus: Secondary | ICD-10-CM

## 2022-06-08 NOTE — Progress Notes (Unsigned)
Location:  [facility] Room Number: 315/A Place of Service:  SNF [phone]) Provider: Royal Hawthorn, NP   Patient Care Team: [name], MD as PCP - General (Internal Medicine) [name], MD (Inactive) as Consulting Physician (Neurology)  Extended Emergency Contact Information Primary Emergency Contact: [place] Phone: [phone] Mobile Phone: [phone] Relation: Relative  Code Status:  DNR Goals of care: Advanced Directive information    06/08/2022   10:36 AM  Advanced Directives  Does Patient Have a Medical Advance Directive? Yes  Type of Paramedic of [place];Living will;Out of facility DNR (pink MOST or yellow form)  Does patient want to make changes to medical advance directive? No - Patient declined  Copy of [place] in Chart? Yes - validated most recent copy scanned in chart (See row information)  Pre-existing out of facility DNR order (yellow form or pink MOST form) Yellow form placed in chart (order not valid for inpatient use)     Chief Complaint  Patient presents with   Medical Management of Chronic Issues    Routine visit.     HPI:  Tracy Nielsen is a 88 y.o. female seen today for medical management of chronic diseases.   PMH significant for glaucoma, seizures, arthritis, HTN, low back pain, dementia, MGUS, h/x of parathyroidectomy, osteoporosis, and peripheral neuropathy.   She moved to memory care last month and seems to be adjusting well. She denies any issues with appetite or sleep and expresses gratitude for [facility] and [facility] care. Remains ambulatory. MMSE 18/30 03/30/22  On lamcital for hx of seizures. No reported episodes. No sure time of last seizure.   Bps reviewed  Blood Pressure: 134 / 67 mmHg  Blood Pressure: 126 / 69 mmHg  Blood Pressure: 146 / 83 mmHg   Blood Pressure: 138 / 74 mmHg  Wt Readings from Last 3 Encounters:  06/08/22 126 lb 3.2 oz  (57.2 kg)  05/16/22 127 lb 12.8 oz (58 kg)  05/14/22 127 lb 12.8 oz (58 kg)   OP: fosamax started 05/2021 T score -5.2 (11/29/20)  Hx of thyroid nodules: US done 11/2021,  enlarged thyroid lobe, nodule 1.6 x 1.1 x 1 TR 3 recommend repeat in 1 year.   Bps reviewed:  Blood Pressure: 126 / 69 mmHg  Blood Pressure: 146 / 83 mmHg Blood Pressure: 138 / 74 mmHg Past Medical History:  Diagnosis Date   Arthritis    Per [facility] new patient packet    Epilepsy ([place])    Per [facility] new patient packet    Glaucoma    Hearing deficit    Hearing aids   High blood pressure    Per [facility] new patient packet    History of abnormal weight loss    Low back pain    Memory difficulty 05/09/2016   MGUS (monoclonal gammopathy of unknown significance)    Neuropathy associated with MGUS ([place]) 11/09/2015   Radiculopathy of sacral and sacrococcygeal region    S1   Seizures (HCC)    Past Surgical History:  Procedure Laterality Date   APPENDECTOMY     BACK SURGERY     CATARACT EXTRACTION Left    OVARY SURGERY     PARATHYROIDECTOMY  9/   TRABECULECTOMY Left     Allergies  Allergen Reactions   Hctz [Hydrochlorothiazide]    Other Other (See Comments)    Caused sodium to drop    Outpatient Encounter Medications as of 06/08/2022  Medication Sig  acetaminophen (TYLENOL) 325 MG tablet Take 650 mg by mouth every 8 (eight) hours as needed.   alendronate (FOSAMAX) 70 MG tablet Take 70 mg by mouth once a week. Take with a full glass of water on an empty stomach.   bimatoprost (LUMIGAN) 0.03 % ophthalmic solution 1 drop at bedtime.   brimonidine (ALPHAGAN) 0.2 % ophthalmic solution Place 1 drop into the right eye 3 (three) times daily. Wait 10 minutes between drops. (Keep eyes closed for 2 minutes after each drop)   chlorhexidine (PERIDEX) 0.12 % solution Use as directed 15 mLs in the mouth or throat at bedtime.   Cholecalciferol (VITAMIN D3) 50 MCG (2000 UT) TABS Take by mouth.   cyanocobalamin 1000 MCG tablet Take  1,000 mcg by mouth in the morning.   diclofenac Sodium (VOLTAREN) 1 % GEL Apply 2 g topically as needed (Apply to left hip prn for pain or legft thumb pain).   donepezil (ARICEPT) 10 MG tablet Take 10 mg by mouth at bedtime.   dorzolamide-timolol (COSOPT) 22.3-6.8 MG/ML ophthalmic solution Place 1 drop into the right eye 2 (two) times daily. Wait 10 minutes between drops. (Keep eyes closed for 2 minutes after each drop)   Eyelid Cleansers (OCUSOFT EYELID CLEANSING) PADS Apply topically. Massage LEFT eyelid/lash line for 3 mins BID As Needed   lamoTRIgine (LAMICTAL) 100 MG tablet Take 1 tablet in morning, 1 and 1/2 tablets at night   memantine (NAMENDA) 5 MG tablet Take 5 mg by mouth daily.   Multiple Vitamin (MULTIVITAMIN) tablet Take 1 tablet by mouth daily.   pilocarpine (PILOCAR) 4 % ophthalmic solution Place 1 drop into the right eye 4 (four) times daily. Wait 10 minutes between drops. (Keep eyes closed for 2 minutes after each drop)   Polyethyl Glycol-Propyl Glycol 0.4-0.3 % SOLN Place 1 drop into the left eye as needed. Wait 10 minutes between drops. (Keep eyes closed for 2 minutes after each drop)   No facility-administered encounter medications on file as of 06/08/2022.    Review of Systems  Constitutional:  Negative for activity change, appetite change, chills, diaphoresis, fatigue, fever and unexpected weight change.  HENT:  Negative for congestion.   Respiratory:  Negative for cough, shortness of breath and wheezing.   Cardiovascular:  Negative for chest pain, palpitations and leg swelling.  Gastrointestinal:  Negative for abdominal distention, abdominal pain, constipation and diarrhea.  Genitourinary:  Negative for difficulty urinating and dysuria.  Musculoskeletal:  Positive for gait problem. Negative for arthralgias, back pain, joint swelling and myalgias.  Neurological:  Negative for dizziness, tremors, seizures, syncope, facial asymmetry, speech difficulty, weakness,  light-headedness, numbness and headaches.  Psychiatric/Behavioral:  Positive for confusion. Negative for agitation and behavioral problems.     Immunization History  Administered Date(s) Administered   Influenza, High Dose Seasonal PF 09/26/2015, 09/03/2017, 08/28/2018, 09/23/2020   Influenza,inj,Quad PF,6+ Mos 10/01/2019   Influenza-Unspecified 09/22/2009, 08/27/2011, 10/08/2012, 12/08/2014, 02/10/2018, 09/06/2021   Moderna Covid-19 Vaccine Bivalent Booster 62yr & up 04/19/2022   Moderna Sars-Covid-2 Vaccination 12/14/2019, 01/12/2020, 09/20/2020, 10/13/2020   Pneumococcal Conjugate-13 12/31/2013   Pneumococcal Polysaccharide-23 12/04/1999, 02/02/2016   Td 12/04/2000   Tdap 11/07/2012   Zoster Recombinat (Shingrix) 06/01/2022   Pertinent  Health Maintenance Due  Topic Date Due   INFLUENZA VACCINE  07/03/2022   DEXA SCAN  Completed      11/09/2020   10:15 AM 02/10/2021   11:01 AM 05/03/2021   10:14 AM 02/07/2022   11:26 AM 03/30/2022    9:35 AM  Fall Risk  Falls in the past year? 0 0 0 0 0  Was there an injury with Fall? 0 0  0 0  Fall Risk Category Calculator 0 0  0 0  Fall Risk Category Low Low  Low Low  Patient Fall Risk Level Low fall risk Low fall risk  Low fall risk Low fall risk  Patient at Risk for Falls Due to    No Fall Risks   Fall risk Follow up    Falls evaluation completed    Functional Status Survey:    Vitals:   06/08/22 1027  BP: 134/67  Pulse: 77  Resp: 18  Temp: 97.9 F (36.6 C)  SpO2: 97%  Weight: 126 lb 3.2 oz (57.2 kg)  Height: '5\' 5"'$  (1.651 m)   Body mass index is 21 kg/m. Physical Exam Vitals and nursing note reviewed.  Constitutional:      General: She is not in acute distress.    Appearance: She is not diaphoretic.  HENT:     Head: Normocephalic and atraumatic.     Nose: Nose normal.     Mouth/Throat:     Mouth: Mucous membranes are moist.     Pharynx: Oropharynx is clear.  Neck:     Vascular: No JVD.  Cardiovascular:     Rate and  Rhythm: Normal rate and regular rhythm.     Heart sounds: No murmur heard. Pulmonary:     Effort: Pulmonary effort is normal. No respiratory distress.     Breath sounds: Normal breath sounds. No wheezing.  Abdominal:     General: Bowel sounds are normal. There is no distension.     Palpations: Abdomen is soft.     Tenderness: There is no abdominal tenderness.  Skin:    General: Skin is warm and dry.  Neurological:     General: No focal deficit present.     Mental Status: She is alert. Mental status is at baseline.  Psychiatric:        Mood and Affect: Mood normal.     Labs reviewed: Recent Labs    11/07/21 0000  NA 140  K 3.6  CL 104  CO2 21  BUN 15  CREATININE 0.9  CALCIUM 8.5*   Recent Labs    11/07/21 0000  AST 24  ALT 15  ALKPHOS 97  ALBUMIN 3.9   Recent Labs    07/14/21 0000 11/07/21 0000  WBC 5.1 4.7  HGB 11.3* 12.1  HCT 29* 35*  PLT 196 138*   Lab Results  Component Value Date   TSH 1.91 11/21/2021   No results found for: "HGBA1C" Lab Results  Component Value Date   CHOL 241 (A) 09/10/2019   HDL 99 (A) 09/10/2019   LDLCALC 128 09/10/2019   TRIG 72 09/10/2019    Significant Diagnostic Results in last 30 days:  No results found.  Assessment/Plan  1. Mixed Alzheimer's and vascular dementia ([place]) Progressive decline in cognition and physical function c/w the disease. Continue supportive care in the skilled environment. Will discuss titrating namenda with Dr. Lyndel Safe  2. Multiple thyroid nodules Need f/U US per recommendations in Dec of 2023  3. Generalized convulsive epilepsy without intractable epilepsy ([place]) No new Continue lamcital   4. Senile osteoporosis On Fosamax,  F/U Dexa due later this year.   5. Primary open angle glaucoma of left eye, moderate stage Followed by ophthalmology    Labs/tests ordered:  Labs pending? Need to check with nurse ordered at last  visit

## 2022-06-10 ENCOUNTER — Encounter: Payer: Self-pay | Admitting: Adult Health

## 2022-06-12 DIAGNOSIS — I158 Other secondary hypertension: Secondary | ICD-10-CM | POA: Diagnosis not present

## 2022-06-12 LAB — BASIC METABOLIC PANEL
BUN: 13 (ref 4–21)
CO2: 24 — AB (ref 13–22)
Chloride: 105 (ref 99–108)
Creatinine: 0.1 — AB (ref 0.5–1.1)
Glucose: 124
Potassium: 4.1 mEq/L (ref 3.5–5.1)
Sodium: 142 (ref 137–147)

## 2022-06-12 LAB — CBC AND DIFFERENTIAL
HCT: 34 — AB (ref 36–46)
Hemoglobin: 12.8 (ref 12.0–16.0)
Platelets: 171 10*3/uL (ref 150–400)
WBC: 5.4

## 2022-06-12 LAB — COMPREHENSIVE METABOLIC PANEL
Calcium: 8.9 (ref 8.7–10.7)
eGFR: 61

## 2022-06-12 LAB — CBC: RBC: 3.16 — AB (ref 3.87–5.11)

## 2022-06-14 DIAGNOSIS — H401133 Primary open-angle glaucoma, bilateral, severe stage: Secondary | ICD-10-CM | POA: Diagnosis not present

## 2022-06-18 DIAGNOSIS — H401133 Primary open-angle glaucoma, bilateral, severe stage: Secondary | ICD-10-CM | POA: Diagnosis not present

## 2022-07-03 ENCOUNTER — Non-Acute Institutional Stay (SKILLED_NURSING_FACILITY): Payer: Medicare Other | Admitting: Orthopedic Surgery

## 2022-07-03 ENCOUNTER — Encounter: Payer: Self-pay | Admitting: Orthopedic Surgery

## 2022-07-03 DIAGNOSIS — G309 Alzheimer's disease, unspecified: Secondary | ICD-10-CM

## 2022-07-03 DIAGNOSIS — F0150 Vascular dementia without behavioral disturbance: Secondary | ICD-10-CM

## 2022-07-03 DIAGNOSIS — M810 Age-related osteoporosis without current pathological fracture: Secondary | ICD-10-CM

## 2022-07-03 DIAGNOSIS — H401122 Primary open-angle glaucoma, left eye, moderate stage: Secondary | ICD-10-CM

## 2022-07-03 DIAGNOSIS — G40309 Generalized idiopathic epilepsy and epileptic syndromes, not intractable, without status epilepticus: Secondary | ICD-10-CM

## 2022-07-03 DIAGNOSIS — F0280 Dementia in other diseases classified elsewhere without behavioral disturbance: Secondary | ICD-10-CM

## 2022-07-03 DIAGNOSIS — I10 Essential (primary) hypertension: Secondary | ICD-10-CM

## 2022-07-03 DIAGNOSIS — E042 Nontoxic multinodular goiter: Secondary | ICD-10-CM

## 2022-07-03 NOTE — Progress Notes (Signed)
Location:   [facility] Room Number: [address] of Service:  SNF ([phone]) Provider:  Windell Moulding, NP  [name], MD  Patient Care Team: [name], MD as PCP - General (Internal Medicine) [name], MD (Inactive) as Consulting Physician (Neurology)  Extended Emergency Contact Information Primary Emergency Contact: [place] Phone: [phone] Mobile Phone: [phone] Relation: Relative  Code Status:  DNR Goals of care: Advanced Directive information    07/03/2022   10:01 AM  Advanced Directives  Does Patient Have a Medical Advance Directive? Yes  Type of Paramedic of [place];Out of facility DNR (pink MOST or yellow form)  Does patient want to make changes to medical advance directive? No - Patient declined  Copy of [place] in Chart? Yes - validated most recent copy scanned in chart (See row information)  Pre-existing out of facility DNR order (yellow form or pink MOST form) Yellow form placed in chart (order not valid for inpatient use)     Chief Complaint  Patient presents with   Medical Management of Chronic Issues    Routine follow up   Immunizations    Flu vaccine due    HPI:  Pt is a 88 y.o. female seen today for medical management of chronic diseases.    She currently resides on the memory care unit at [facility] due to AD. PMH: HTN, COPD, epilepsy, neuropathy, pseudogout,OA, osteoporosis, open angle glaucoma.  Alzheimers- MMSE 18/30 03/2022, was 26/30 11/2021, needing more instruction and care in past 2 months, moved to memory care 05/17/2022, adjusting well, ambulates on own, remains  remains on Aricept and Namenda Epilepsy- followed by neurology, no recent seizures, remains on Lamictal Thyroid nodules- h/o thyroid nodules to both lobes, nodule 1.6 x 1.1 x 1 (right lobe), repeat U/S in 1 year- 11/2022, TSH 1.91/ T4 5.8 11/2021 HTN- BUN/creat 11.6/0.9  06/12/2022, off medication at this time, see pressures below Osteoporosis- DEXA 2021, t score -5.2, remains on Fosamax Glaucoma- followed by ophthalmology, remains on brimonidine/bimatoprost/dorzolamide/timolol/pilocarpine  No recent falls or injuries.   Recent blood pressures:  07/26- 151/76  07/19- 117/71  07/12- 138/74  Recent weights:  08/01- 124.6 lbs  07/01- 126.2 lbs  06/08- 127.8 lbs    Past Medical History:  Diagnosis Date   Arthritis    Per [facility] new patient packet    Epilepsy ([place])    Per [facility] new patient packet    Glaucoma    Hearing deficit    Hearing aids   High blood pressure    Per [facility] new patient packet    History of abnormal weight loss    Low back pain    Memory difficulty 05/09/2016   MGUS (monoclonal gammopathy of unknown significance)    Neuropathy associated with MGUS ([place]) 11/09/2015   Radiculopathy of sacral and sacrococcygeal region    S1   Seizures ([place])    Past Surgical History:  Procedure Laterality Date   APPENDECTOMY     BACK SURGERY     CATARACT EXTRACTION Left    OVARY SURGERY     PARATHYROIDECTOMY  9/   TRABECULECTOMY Left     Allergies  Allergen Reactions   Hctz [Hydrochlorothiazide]    Other Other (See Comments)    Caused sodium to drop    Allergies as of 07/03/2022       Reactions   Hctz [hydrochlorothiazide]    Other Other (See Comments)   Caused sodium to drop  Medication List        Accurate as of July 03, 2022 10:19 AM. If you have any questions, ask your nurse or doctor.          acetaminophen 325 MG tablet Commonly known as: TYLENOL Take 650 mg by mouth every 8 (eight) hours as needed.   alendronate 70 MG tablet Commonly known as: FOSAMAX Take 70 mg by mouth once a week. Take with a full glass of water on an empty stomach.   bimatoprost 0.03 % ophthalmic solution Commonly known as: LUMIGAN 1 drop at bedtime.   brimonidine 0.2 % ophthalmic solution Commonly known as: ALPHAGAN Place 1 drop  into the right eye 3 (three) times daily. Wait 10 minutes between drops. (Keep eyes closed for 2 minutes after each drop)   chlorhexidine 0.12 % solution Commonly known as: PERIDEX Use as directed 15 mLs in the mouth or throat at bedtime.   cyanocobalamin 1000 MCG tablet Take 1,000 mcg by mouth in the morning.   diclofenac Sodium 1 % Gel Commonly known as: VOLTAREN Apply 2 g topically as needed (Apply to left hip prn for pain or legft thumb pain).   donepezil 10 MG tablet Commonly known as: ARICEPT Take 10 mg by mouth at bedtime.   dorzolamide-timolol 22.3-6.8 MG/ML ophthalmic solution Commonly known as: COSOPT Place 1 drop into the right eye 2 (two) times daily. Wait 10 minutes between drops. (Keep eyes closed for 2 minutes after each drop)   lamoTRIgine 100 MG tablet Commonly known as: LAMICTAL Take 1 tablet in morning, 1 and 1/2 tablets at night   memantine 5 MG tablet Commonly known as: NAMENDA Take 5 mg by mouth daily.   multivitamin tablet Take 1 tablet by mouth daily.   OcuSoft Eyelid Cleansing Pads Apply topically. Massage LEFT eyelid/lash line for 3 mins BID As Needed   pilocarpine 4 % ophthalmic solution Commonly known as: PILOCAR Place 1 drop into the right eye 4 (four) times daily. Wait 10 minutes between drops. (Keep eyes closed for 2 minutes after each drop)   Polyethyl Glycol-Propyl Glycol 0.4-0.3 % Soln Place 1 drop into the left eye as needed. Wait 10 minutes between drops. (Keep eyes closed for 2 minutes after each drop)   Rhopressa 0.02 % Soln Generic drug: Netarsudil Dimesylate Apply 1 drop to eye every evening. Apply 1 drop to right eye every evening. Wait 5 mins between drops.   Vitamin D3 50 MCG (2000 UT) Tabs Take by mouth.        Review of Systems  Unable to perform ROS: Dementia    Immunization History  Administered Date(s) Administered   Influenza, High Dose Seasonal PF 09/26/2015, 09/03/2017, 08/28/2018, 09/23/2020    Influenza,inj,Quad PF,6+ Mos 10/01/2019   Influenza-Unspecified 09/22/2009, 08/27/2011, 10/08/2012, 12/08/2014, 02/10/2018, 09/06/2021   Moderna Covid-19 Vaccine Bivalent Booster 21yr & up 04/19/2022   Moderna Sars-Covid-2 Vaccination 12/14/2019, 01/12/2020, 09/20/2020, 10/13/2020   Pneumococcal Conjugate-13 12/31/2013   Pneumococcal Polysaccharide-23 12/04/1999, 02/02/2016   Td 12/04/2000   Tdap 11/07/2012   Zoster Recombinat (Shingrix) 06/01/2022   Pertinent  Health Maintenance Due  Topic Date Due   INFLUENZA VACCINE  07/03/2022   DEXA SCAN  Completed      11/09/2020   10:15 AM 02/10/2021   11:01 AM 05/03/2021   10:14 AM 02/07/2022   11:26 AM 03/30/2022    9:35 AM  Fall Risk  Falls in the past year? 0 0 0 0 0  Was there an injury with Fall? 0  0  0 0  Fall Risk Category Calculator 0 0  0 0  Fall Risk Category Low Low  Low Low  Patient Fall Risk Level Low fall risk Low fall risk  Low fall risk Low fall risk  Patient at Risk for Falls Due to    No Fall Risks   Fall risk Follow up    Falls evaluation completed    Functional Status Survey:    Vitals:   07/03/22 0954  BP: (!) 151/76  Pulse: 80  Resp: 16  Temp: 97.9 F (36.6 C)  SpO2: 98%  Weight: 126 lb 3.2 oz (57.2 kg)  Height: '5\' 5"'$  (1.651 m)   Body mass index is 21 kg/m. Physical Exam Vitals reviewed.  Constitutional:      General: She is not in acute distress. HENT:     Head: Normocephalic.     Right Ear: There is no impacted cerumen.     Left Ear: There is no impacted cerumen.     Nose: Nose normal.     Mouth/Throat:     Mouth: Mucous membranes are moist.  Eyes:     General:        Right eye: No discharge.        Left eye: No discharge.  Neck:     Thyroid: Thyromegaly present. No thyroid mass or thyroid tenderness.  Cardiovascular:     Rate and Rhythm: Normal rate and regular rhythm.     Pulses: Normal pulses.     Heart sounds: Normal heart sounds.  Pulmonary:     Effort: Pulmonary effort is normal. No  respiratory distress.     Breath sounds: Normal breath sounds. No wheezing.  Abdominal:     General: Bowel sounds are normal. There is no distension.     Palpations: Abdomen is soft.     Tenderness: There is no abdominal tenderness.  Musculoskeletal:     Cervical back: Neck supple.     Right lower leg: No edema.     Left lower leg: No edema.  Skin:    General: Skin is warm and dry.     Capillary Refill: Capillary refill takes less than 2 seconds.  Neurological:     General: No focal deficit present.     Mental Status: She is alert. Mental status is at baseline.     Motor: No weakness.     Gait: Gait normal.  Psychiatric:        Mood and Affect: Mood normal.        Behavior: Behavior normal.     Comments: Very pleasant, follows commands, alert to self and familiar face     Labs reviewed: Recent Labs    11/07/21 0000  NA 140  K 3.6  CL 104  CO2 21  BUN 15  CREATININE 0.9  CALCIUM 8.5*   Recent Labs    11/07/21 0000  AST 24  ALT 15  ALKPHOS 97  ALBUMIN 3.9   Recent Labs    07/14/21 0000 11/07/21 0000  WBC 5.1 4.7  HGB 11.3* 12.1  HCT 29* 35*  PLT 196 138*   Lab Results  Component Value Date   TSH 1.91 11/21/2021   No results found for: "HGBA1C" Lab Results  Component Value Date   CHOL 241 (A) 09/10/2019   HDL 99 (A) 09/10/2019   LDLCALC 128 09/10/2019   TRIG 72 09/10/2019    Significant Diagnostic Results in last 30 days:  No results found.  Assessment/Plan 1.  Mixed Alzheimer's and vascular dementia ([place]) - moved to memory care 06/15 - adjusting well - no behaviors - ambulates on own - cont Aricept and Namenda  2. Generalized convulsive epilepsy without intractable epilepsy ([place]) - followed by neurology - no recent seizures - cont Lamictal  3. Multiple thyroid nodules - nodule 1.6 x 1.1 x 1 (right lobe) - repeat U/S in 1 year- due 11/2022 - TSH/T4 normal  4. Essential hypertension - controlled without medication  5. Senile  osteoporosis - DEXA 2021, t score -5.2 - cont Fosamax  6. Primary open angle glaucoma of left eye, moderate stage - followed by opthalmology - cont brimonidine/bimatoprost/dorzolamide/timolol/pilocarpine     Family/ staff Communication: plan discussed with patient and nurse  Labs/tests ordered: none

## 2022-07-17 ENCOUNTER — Non-Acute Institutional Stay (SKILLED_NURSING_FACILITY): Payer: Medicare Other | Admitting: Orthopedic Surgery

## 2022-07-17 ENCOUNTER — Encounter: Payer: Self-pay | Admitting: Orthopedic Surgery

## 2022-07-17 DIAGNOSIS — U071 COVID-19: Secondary | ICD-10-CM

## 2022-07-17 NOTE — Progress Notes (Signed)
Location:   [place] Room Number: [place] of Service:  SNF [phone]) Provider:  Windell Moulding, NP  [name], MD  Patient Care Team: [name], MD as PCP - General (Internal Medicine) [name], MD (Inactive) as Consulting Physician (Neurology)  Extended Emergency Contact Information Primary Emergency Contact: [place] Phone: [phone] Mobile Phone: [phone] Relation: Relative  Code Status:  DNR Goals of care: Advanced Directive information    07/17/2022   10:31 AM  Advanced Directives  Does Patient Have a Medical Advance Directive? Yes  Type of Paramedic of [place];Out of facility DNR (pink MOST or yellow form)  Does patient want to make changes to medical advance directive? No - Patient declined  Copy of [place] in Chart? Yes - validated most recent copy scanned in chart (See row information)  Pre-existing out of facility DNR order (yellow form or pink MOST form) Yellow form placed in chart (order not valid for inpatient use)     Chief Complaint  Patient presents with   Acute Visit    Positive COVID test    HPI:  Pt is a 88 y.o. female seen today for an acute visit for positive covid test.   She currently resides on the memory care unit at [facility] due to [place]. 08/14 she had dry cough and increased fatigue. Rapid covid test positive. She also had fever of 101.6 yesterday, resolved with tylenol. Temp this morning 97.3. She denies sore throat, cough, congestion, body aches or chills. Admits to feeling tired. Eating and drinking well. Vitals stable.    Past Medical History:  Diagnosis Date   Arthritis    Per [place] new patient packet    Epilepsy [facility])    Per [place] new patient packet    Glaucoma    Hearing deficit    Hearing aids   High blood pressure    Per [facility] new patient packet    History of abnormal weight loss    Low back pain    Memory  difficulty 05/09/2016   MGUS (monoclonal gammopathy of unknown significance)    Neuropathy associated with MGUS ([place]) 11/09/2015   Radiculopathy of sacral and sacrococcygeal region    S1   Seizures ([place])    Past Surgical History:  Procedure Laterality Date   APPENDECTOMY     BACK SURGERY     CATARACT EXTRACTION Left    OVARY SURGERY     PARATHYROIDECTOMY  9/   TRABECULECTOMY Left     Allergies  Allergen Reactions   Hctz [Hydrochlorothiazide]    Other Other (See Comments)    Caused sodium to drop    Allergies as of 07/17/2022       Reactions   Hctz [hydrochlorothiazide]    Other Other (See Comments)   Caused sodium to drop        Medication List        Accurate as of July 17, 2022 10:35 AM. If you have any questions, ask your nurse or doctor.          acetaminophen 325 MG tablet Commonly known as: TYLENOL Take 650 mg by mouth every 8 (eight) hours as needed.   alendronate 70 MG tablet Commonly known as: FOSAMAX Take 70 mg by mouth once a week. Take with a full glass of water on an empty stomach.   bimatoprost 0.03 % ophthalmic solution Commonly known as: LUMIGAN 1 drop at bedtime.   brimonidine  0.2 % ophthalmic solution Commonly known as: ALPHAGAN Place 1 drop into the right eye 3 (three) times daily. Wait 10 minutes between drops. (Keep eyes closed for 2 minutes after each drop)   chlorhexidine 0.12 % solution Commonly known as: PERIDEX Use as directed 15 mLs in the mouth or throat at bedtime.   cyanocobalamin 1000 MCG tablet Take 1,000 mcg by mouth in the morning.   diclofenac Sodium 1 % Gel Commonly known as: VOLTAREN Apply 2 g topically as needed (Apply to left hip prn for pain or legft thumb pain).   donepezil 10 MG tablet Commonly known as: ARICEPT Take 10 mg by mouth at bedtime.   dorzolamide-timolol 22.3-6.8 MG/ML ophthalmic solution Commonly known as: COSOPT Place 1 drop into the right eye 2 (two) times daily. Wait 10 minutes between  drops. (Keep eyes closed for 2 minutes after each drop)   lamoTRIgine 100 MG tablet Commonly known as: LAMICTAL Take 100 mg by mouth daily.   lamoTRIgine 100 MG tablet Commonly known as: LAMICTAL Take 1 tablet in morning, 1 and 1/2 tablets at night   memantine 5 MG tablet Commonly known as: NAMENDA Take 5 mg by mouth daily.   multivitamin tablet Take 1 tablet by mouth daily.   OcuSoft Eyelid Cleansing Pads Apply topically. Massage LEFT eyelid/lash line for 3 mins BID As Needed   pilocarpine 4 % ophthalmic solution Commonly known as: PILOCAR Place 1 drop into the right eye 4 (four) times daily. Wait 10 minutes between drops. (Keep eyes closed for 2 minutes after each drop)   Polyethyl Glycol-Propyl Glycol 0.4-0.3 % Soln Place 1 drop into the left eye as needed. Wait 10 minutes between drops. (Keep eyes closed for 2 minutes after each drop)   Rhopressa 0.02 % Soln Generic drug: Netarsudil Dimesylate Apply 1 drop to eye every evening. Apply 1 drop to right eye every evening. Wait 5 mins between drops.   Vitamin D3 50 MCG (2000 UT) Tabs Take by mouth.        Review of Systems  Constitutional:  Positive for fatigue. Negative for activity change, appetite change, chills and fever.  HENT:  Negative for congestion, sore throat and trouble swallowing.   Respiratory:  Negative for cough, shortness of breath and wheezing.   Cardiovascular:  Negative for chest pain and leg swelling.  Gastrointestinal:  Negative for diarrhea, nausea and vomiting.  Musculoskeletal:  Negative for myalgias.  Neurological:  Negative for weakness and headaches.  Psychiatric/Behavioral:  Positive for confusion. Negative for dysphoric mood. The patient is not nervous/anxious.     Immunization History  Administered Date(s) Administered   Influenza, High Dose Seasonal PF 09/26/2015, 09/03/2017, 08/28/2018, 09/23/2020   Influenza,inj,Quad PF,6+ Mos 10/01/2019   Influenza-Unspecified 09/22/2009,  08/27/2011, 10/08/2012, 12/08/2014, 02/10/2018, 09/06/2021   Moderna Covid-19 Vaccine Bivalent Booster 38yr & up 04/19/2022   Moderna Sars-Covid-2 Vaccination 12/14/2019, 01/12/2020, 09/20/2020, 10/13/2020   Pneumococcal Conjugate-13 12/31/2013   Pneumococcal Polysaccharide-23 12/04/1999, 02/02/2016   Td 12/04/2000   Tdap 11/07/2012   Zoster Recombinat (Shingrix) 06/01/2022   Pertinent  Health Maintenance Due  Topic Date Due   INFLUENZA VACCINE  07/03/2022   DEXA SCAN  Completed      11/09/2020   10:15 AM 02/10/2021   11:01 AM 05/03/2021   10:14 AM 02/07/2022   11:26 AM 03/30/2022    9:35 AM  Fall Risk  Falls in the past year? 0 0 0 0 0  Was there an injury with Fall? 0 0  0 0  Fall Risk Category Calculator 0 0  0 0  Fall Risk Category Low Low  Low Low  Patient Fall Risk Level Low fall risk Low fall risk  Low fall risk Low fall risk  Patient at Risk for Falls Due to    No Fall Risks   Fall risk Follow up    Falls evaluation completed    Functional Status Survey:    Vitals:   07/17/22 1019  BP: 132/68  Pulse: 87  Resp: 16  Temp: (!) 97.3 F (36.3 C)  SpO2: 94%  Weight: 124 lb 9.6 oz (56.5 kg)  Height: '5\' 5"'$  (1.651 m)   Body mass index is 20.73 kg/m. Physical Exam Vitals reviewed.  Constitutional:      General: She is not in acute distress. HENT:     Head: Normocephalic.     Right Ear: There is no impacted cerumen.     Left Ear: There is no impacted cerumen.     Nose: Nose normal. No congestion.     Mouth/Throat:     Mouth: Mucous membranes are moist.     Pharynx: No posterior oropharyngeal erythema.  Eyes:     General:        Right eye: No discharge.        Left eye: No discharge.  Cardiovascular:     Rate and Rhythm: Normal rate and regular rhythm.     Pulses: Normal pulses.     Heart sounds: Normal heart sounds.  Pulmonary:     Effort: Pulmonary effort is normal. No respiratory distress.     Breath sounds: Normal breath sounds. No wheezing.  Abdominal:      General: Bowel sounds are normal. There is no distension.     Palpations: Abdomen is soft.     Tenderness: There is no abdominal tenderness.  Musculoskeletal:     Cervical back: Neck supple.     Right lower leg: No edema.     Left lower leg: No edema.  Lymphadenopathy:     Cervical: No cervical adenopathy.  Skin:    General: Skin is warm and dry.     Capillary Refill: Capillary refill takes less than 2 seconds.  Neurological:     General: No focal deficit present.     Mental Status: She is alert. Mental status is at baseline.  Psychiatric:        Mood and Affect: Mood normal.        Behavior: Behavior normal.     Comments: Very pleasant, follows commands, alert to self/person     Labs reviewed: Recent Labs    11/07/21 0000  NA 140  K 3.6  CL 104  CO2 21  BUN 15  CREATININE 0.9  CALCIUM 8.5*   Recent Labs    11/07/21 0000  AST 24  ALT 15  ALKPHOS 97  ALBUMIN 3.9   Recent Labs    11/07/21 0000  WBC 4.7  HGB 12.1  HCT 35*  PLT 138*   Lab Results  Component Value Date   TSH 1.91 11/21/2021   No results found for: "HGBA1C" Lab Results  Component Value Date   CHOL 241 (A) 09/10/2019   HDL 99 (A) 09/10/2019   LDLCALC 128 09/10/2019   TRIG 72 09/10/2019    Significant Diagnostic Results in last 30 days:  No results found.  Assessment/Plan 1. COVID-19 - + covid test 08/14 - fever 101.6 08/14- resolved with tylenol  - reports only fatigue today - exam unremarkable -  will hold off on paxlovid due to mild symptoms - advised nursing to report worsening symptoms to PCP - start vitamin C 1000 mg po daily x 7 days - start zinc 50 mg po daily x 7 days - cont vitamin D - cont isolation precautions  Family/ staff Communication: plan discussed with patient and nurse  Labs/tests ordered:  none

## 2022-07-31 DIAGNOSIS — M79672 Pain in left foot: Secondary | ICD-10-CM | POA: Diagnosis not present

## 2022-07-31 DIAGNOSIS — B351 Tinea unguium: Secondary | ICD-10-CM | POA: Diagnosis not present

## 2022-07-31 DIAGNOSIS — M79671 Pain in right foot: Secondary | ICD-10-CM | POA: Diagnosis not present

## 2022-07-31 DIAGNOSIS — L84 Corns and callosities: Secondary | ICD-10-CM | POA: Diagnosis not present

## 2022-08-03 DIAGNOSIS — H401133 Primary open-angle glaucoma, bilateral, severe stage: Secondary | ICD-10-CM | POA: Diagnosis not present

## 2022-08-21 ENCOUNTER — Non-Acute Institutional Stay (SKILLED_NURSING_FACILITY): Payer: Medicare Other | Admitting: Internal Medicine

## 2022-08-21 DIAGNOSIS — F0280 Dementia in other diseases classified elsewhere without behavioral disturbance: Secondary | ICD-10-CM

## 2022-08-21 DIAGNOSIS — M810 Age-related osteoporosis without current pathological fracture: Secondary | ICD-10-CM

## 2022-08-21 DIAGNOSIS — G40309 Generalized idiopathic epilepsy and epileptic syndromes, not intractable, without status epilepticus: Secondary | ICD-10-CM

## 2022-08-21 DIAGNOSIS — I10 Essential (primary) hypertension: Secondary | ICD-10-CM

## 2022-08-21 DIAGNOSIS — F0150 Vascular dementia without behavioral disturbance: Secondary | ICD-10-CM

## 2022-08-21 DIAGNOSIS — G309 Alzheimer's disease, unspecified: Secondary | ICD-10-CM | POA: Diagnosis not present

## 2022-08-25 ENCOUNTER — Encounter: Payer: Self-pay | Admitting: Internal Medicine

## 2022-08-25 NOTE — Progress Notes (Signed)
Location:  [place] Room Number: 315/A Place of Service:  SNF (31)  Provider: [name]   Code Status: DNR Goals of Care:     07/17/2022   10:31 AM  Advanced Directives  Does Patient Have a Medical Advance Directive? Yes  Type of Paramedic of [place];Out of facility DNR (pink MOST or yellow form)  Does patient want to make changes to medical advance directive? No - Patient declined  Copy of [place] in Chart? Yes - validated most recent copy scanned in chart (See row information)  Pre-existing out of facility DNR order (yellow form or pink MOST form) Yellow form placed in chart (order not valid for inpatient use)     Chief Complaint  Patient presents with   Chronic Care Management    HPI: Patient is a 88 y.o. female seen today for medical management of chronic diseases.   Lives in Memory unit in [place]  Has h/o Alzheimer Dementia, h/o Seizure disorder and Osteoarthritis and Hypertension H/o Peripheral Neuropathy    She  is stable.No new Nursing issues. No Behavior issues Her weight is stable Walks with her walker No Falls Wt Readings from Last 3 Encounters:  08/25/22 126 lb (57.2 kg)  07/17/22 124 lb 9.6 oz (56.5 kg)  07/03/22 126 lb 3.2 oz (57.2 kg)    Past Medical History:  Diagnosis Date   Arthritis    Per [facility] new patient packet    Epilepsy ([place])    Per [name] new patient packet    Glaucoma    Hearing deficit    Hearing aids   High blood pressure    Per [facility] new patient packet    History of abnormal weight loss    Low back pain    Memory difficulty 05/09/2016   MGUS (monoclonal gammopathy of unknown significance)    Neuropathy associated with MGUS ([place]) 11/09/2015   Radiculopathy of sacral and sacrococcygeal region    S1   Seizures ([place])     Past Surgical History:  Procedure Laterality Date   APPENDECTOMY     BACK SURGERY     CATARACT EXTRACTION Left    OVARY SURGERY      PARATHYROIDECTOMY  9/   TRABECULECTOMY Left     Allergies  Allergen Reactions   Hctz [Hydrochlorothiazide]    Other Other (See Comments)    Caused sodium to drop    Outpatient Encounter Medications as of 08/21/2022  Medication Sig   acetaminophen (TYLENOL) 325 MG tablet Take 650 mg by mouth every 8 (eight) hours as needed.   alendronate (FOSAMAX) 70 MG tablet Take 70 mg by mouth once a week. Take with a full glass of water on an empty stomach.   bimatoprost (LUMIGAN) 0.03 % ophthalmic solution 1 drop at bedtime.   brimonidine (ALPHAGAN) 0.2 % ophthalmic solution Place 1 drop into the right eye 3 (three) times daily. Wait 10 minutes between drops. (Keep eyes closed for 2 minutes after each drop)   chlorhexidine (PERIDEX) 0.12 % solution Use as directed 15 mLs in the mouth or throat at bedtime.   Cholecalciferol (VITAMIN D3) 50 MCG (2000 UT) TABS Take by mouth.   cyanocobalamin 1000 MCG tablet Take 1,000 mcg by mouth in the morning.   diclofenac Sodium (VOLTAREN) 1 % GEL Apply 2 g topically as needed (Apply to left hip prn for pain or legft thumb pain).   donepezil (ARICEPT) 10 MG tablet Take 10 mg  by mouth at bedtime.   dorzolamide-timolol (COSOPT) 22.3-6.8 MG/ML ophthalmic solution Place 1 drop into the right eye 2 (two) times daily. Wait 10 minutes between drops. (Keep eyes closed for 2 minutes after each drop)   Eyelid Cleansers (OCUSOFT EYELID CLEANSING) PADS Apply topically. Massage LEFT eyelid/lash line for 3 mins BID As Needed   lamoTRIgine (LAMICTAL) 100 MG tablet Take 1 tablet in morning, 1 and 1/2 tablets at night   lamoTRIgine (LAMICTAL) 100 MG tablet Take 100 mg by mouth daily.   memantine (NAMENDA) 5 MG tablet Take 5 mg by mouth 2 (two) times daily.   Multiple Vitamin (MULTIVITAMIN) tablet Take 1 tablet by mouth daily.   Netarsudil Dimesylate (RHOPRESSA) 0.02 % SOLN Apply 1 drop to eye every evening. Apply 1 drop to right eye every evening. Wait 5 mins between drops.    pilocarpine (PILOCAR) 4 % ophthalmic solution Place 1 drop into the right eye 4 (four) times daily. Wait 10 minutes between drops. (Keep eyes closed for 2 minutes after each drop)   Polyethyl Glycol-Propyl Glycol 0.4-0.3 % SOLN Place 1 drop into the left eye as needed. Wait 10 minutes between drops. (Keep eyes closed for 2 minutes after each drop)   No facility-administered encounter medications on file as of 08/21/2022.    Review of Systems:  Review of Systems  Unable to perform ROS: Dementia    Health Maintenance  Topic Date Due   INFLUENZA VACCINE  07/03/2022   Zoster Vaccines- Shingrix (2 of 2) 07/27/2022   COVID-19 Vaccine (6 - Moderna series) 08/20/2022   TETANUS/TDAP  11/07/2022   Pneumonia Vaccine 107+ Years old  Completed   DEXA SCAN  Completed   HPV VACCINES  Aged Out    Physical Exam: Vitals:   08/25/22 0856  BP: 120/76  Pulse: 70  Resp: 16  Temp: 98 F (36.7 C)  SpO2: 94%  Weight: 126 lb (57.2 kg)   Body mass index is 20.97 kg/m. Physical Exam Vitals reviewed.  Constitutional:      Appearance: Normal appearance.  HENT:     Head: Normocephalic.     Nose: Nose normal.     Mouth/Throat:     Mouth: Mucous membranes are moist.     Pharynx: Oropharynx is clear.  Eyes:     Pupils: Pupils are equal, round, and reactive to light.  Cardiovascular:     Rate and Rhythm: Normal rate and regular rhythm.     Pulses: Normal pulses.     Heart sounds: Normal heart sounds. No murmur heard. Pulmonary:     Effort: Pulmonary effort is normal.     Breath sounds: Normal breath sounds.  Abdominal:     General: Abdomen is flat. Bowel sounds are normal.     Palpations: Abdomen is soft.  Musculoskeletal:     Cervical back: Neck supple.     Comments: Mild edema  Skin:    General: Skin is warm.  Neurological:     General: No focal deficit present.     Mental Status: She is alert.  Psychiatric:        Mood and Affect: Mood normal.        Thought Content: Thought content  normal.     Labs reviewed: Basic Metabolic Panel: Recent Labs    11/07/21 0000 11/21/21 0000  NA 140  --   K 3.6  --   CL 104  --   CO2 21  --   BUN 15  --  CREATININE 0.9  --   CALCIUM 8.5*  --   TSH 1.38 1.91   Liver Function Tests: Recent Labs    11/07/21 0000  AST 24  ALT 15  ALKPHOS 97  ALBUMIN 3.9   No results for input(s): "LIPASE", "AMYLASE" in the last 8760 hours. No results for input(s): "AMMONIA" in the last 8760 hours. CBC: Recent Labs    11/07/21 0000  WBC 4.7  HGB 12.1  HCT 35*  PLT 138*   Lipid Panel: No results for input(s): "CHOL", "HDL", "LDLCALC", "TRIG", "CHOLHDL", "LDLDIRECT" in the last 8760 hours. No results found for: "HGBA1C"  Procedures since last visit: No results found.  Assessment/Plan 1. Mixed Alzheimer's and vascular dementia ([place]) Increase Namenda to 10 mg BID Also on Aricept MMS E18/30 2. Generalized convulsive epilepsy without intractable epilepsy ([place]) On Lamictal 3. Essential hypertension Off all meds now 4. Senile osteoporosis On Fosamax since 05/22 Could not do Prolia due to expense in [place] 5 B 12 def Now on PO supplement  Labs/tests ordered:  Labs done in 07/23 were Normal limits  [name], MD

## 2022-09-18 ENCOUNTER — Non-Acute Institutional Stay (SKILLED_NURSING_FACILITY): Payer: Medicare Other | Admitting: Orthopedic Surgery

## 2022-09-18 ENCOUNTER — Encounter: Payer: Self-pay | Admitting: Orthopedic Surgery

## 2022-09-18 DIAGNOSIS — E538 Deficiency of other specified B group vitamins: Secondary | ICD-10-CM | POA: Diagnosis not present

## 2022-09-18 DIAGNOSIS — E042 Nontoxic multinodular goiter: Secondary | ICD-10-CM

## 2022-09-18 DIAGNOSIS — I10 Essential (primary) hypertension: Secondary | ICD-10-CM

## 2022-09-18 DIAGNOSIS — G309 Alzheimer's disease, unspecified: Secondary | ICD-10-CM

## 2022-09-18 DIAGNOSIS — H401132 Primary open-angle glaucoma, bilateral, moderate stage: Secondary | ICD-10-CM

## 2022-09-18 DIAGNOSIS — F0280 Dementia in other diseases classified elsewhere without behavioral disturbance: Secondary | ICD-10-CM

## 2022-09-18 DIAGNOSIS — M810 Age-related osteoporosis without current pathological fracture: Secondary | ICD-10-CM

## 2022-09-18 DIAGNOSIS — G40309 Generalized idiopathic epilepsy and epileptic syndromes, not intractable, without status epilepticus: Secondary | ICD-10-CM | POA: Diagnosis not present

## 2022-09-18 DIAGNOSIS — F0150 Vascular dementia without behavioral disturbance: Secondary | ICD-10-CM

## 2022-09-18 NOTE — Progress Notes (Signed)
Location:  [facility] Room Number: 324/M Place of Service:  SNF [phone]) Provider:  Windell Moulding NP  [name], MD  Patient Care Team: [name], MD as PCP - General (Internal Medicine) [name], MD (Inactive) as Consulting Physician (Neurology)  Extended Emergency Contact Information Primary Emergency Contact: [place] Phone: [phone] Mobile Phone: [phone] Relation: Relative  Code Status: DNR  Goals of care: Advanced Directive information    09/18/2022   11:55 AM  Advanced Directives  Does Patient Have a Medical Advance Directive? Yes  Type of Paramedic of [place];Out of facility DNR (pink MOST or yellow form)  Does patient want to make changes to medical advance directive? No - Patient declined  Copy of [place] in Chart? Yes - validated most recent copy scanned in chart (See row information)  Pre-existing out of facility DNR order (yellow form or pink MOST form) Yellow form placed in chart (order not valid for inpatient use)     Chief Complaint  Patient presents with   Medical Management of Chronic Issues    Routine    HPI:  Pt is a 88 y.o. female seen today for medical management of chronic diseases.    She currently resides on the memory care unit at [facility] due to AD. PMH: HTN, COPD, epilepsy, neuropathy, pseudogout,OA, osteoporosis, open angle glaucoma.   Alzheimers- MMSE 18/30 03/2022, was 26/30 11/2021,  moved to memory care 05/17/2022- has adjusted well, dependent with some ADLs, ambulates on own with walker, remains on Aricept and Namenda Epilepsy- followed by neurology, no recent seizures, remains on Lamictal Thyroid nodules- h/o thyroid nodules to both lobes, nodule 1.6 x 1.1 x 1 (right lobe), repeat U/S in 1 year- 11/2022, TSH 1.91/ T4 5.8 11/2021 HTN- BUN/creat 11.6/0.9 06/12/2022, off medication at this time, see pressures  below Osteoporosis- DEXA 2021, t score -5.2, remains on Fosamax B12 deficiency- remains on cyanocobalamin Glaucoma- followed by ophthalmology, remains on brimonidine/bimatoprost/dorzolamide-timolol/pilocarpine  No recent falls or injuries.   Recent blood pressures:  10/11- 138/73  10/04- 143/72  09/27- 157/68  Recent weights:  10/01- 128.6 lbs  09/01- 126 lbs  08/01- 124.6 lbs Past Medical History:  Diagnosis Date   Arthritis    Per [place] new patient packet    Epilepsy ([place])    Per [place] new patient packet    Glaucoma    Hearing deficit    Hearing aids   High blood pressure    Per [facility] new patient packet    History of abnormal weight loss    Low back pain    Memory difficulty 05/09/2016   MGUS (monoclonal gammopathy of unknown significance)    Neuropathy associated with MGUS ([place]) 11/09/2015   Radiculopathy of sacral and sacrococcygeal region    S1   Seizures ([place])    Past Surgical History:  Procedure Laterality Date   APPENDECTOMY     BACK SURGERY     CATARACT EXTRACTION Left    OVARY SURGERY     PARATHYROIDECTOMY  9/   TRABECULECTOMY Left     Allergies  Allergen Reactions   Hctz [Hydrochlorothiazide]    Other Other (See Comments)    Caused sodium to drop    Allergies as of 09/18/2022       Reactions   Hctz [hydrochlorothiazide]    Other Other (See Comments)   Caused sodium to drop        Medication List  Accurate as of September 18, 2022 12:09 PM. If you have any questions, ask your nurse or doctor.          acetaminophen 325 MG tablet Commonly known as: TYLENOL Take 650 mg by mouth every 8 (eight) hours as needed.   alendronate 70 MG tablet Commonly known as: FOSAMAX Take 70 mg by mouth once a week. Take with a full glass of water on an empty stomach.   bimatoprost 0.03 % ophthalmic solution Commonly known as: LUMIGAN Place 1 drop into both eyes at bedtime.   brimonidine 0.2 % ophthalmic solution Commonly known as: ALPHAGAN Place 1  drop into the right eye 3 (three) times daily. Wait 10 minutes between drops. (Keep eyes closed for 2 minutes after each drop)   chlorhexidine 0.12 % solution Commonly known as: PERIDEX Use as directed 5 mLs in the mouth or throat at bedtime. 1 tsp   cyanocobalamin 1000 MCG tablet Take 1,000 mcg by mouth in the morning.   diclofenac Sodium 1 % Gel Commonly known as: VOLTAREN Apply 4 g topically as needed (Apply to left hip prn for pain or legft thumb pain).   donepezil 10 MG tablet Commonly known as: ARICEPT Take 10 mg by mouth at bedtime.   dorzolamide-timolol 2-0.5 % ophthalmic solution Commonly known as: COSOPT Place 1 drop into the right eye 2 (two) times daily. Wait 10 minutes between drops. (Keep eyes closed for 2 minutes after each drop)   lamoTRIgine 100 MG tablet Commonly known as: LAMICTAL Take 100 mg by mouth daily. Daily in the morning.   lamoTRIgine 100 MG tablet Commonly known as: LAMICTAL Take 1 tablet in morning, 1 and 1/2 tablets at night   memantine 5 MG tablet Commonly known as: NAMENDA Take 5 mg by mouth 2 (two) times daily.   multivitamin tablet Take 1 tablet by mouth daily.   OcuSoft Eyelid Cleansing Pads Apply topically. Massage LEFT eyelid/lash line for 3 mins BID As Needed   pilocarpine 4 % ophthalmic solution Commonly known as: PILOCAR Place 1 drop into the right eye 4 (four) times daily. Wait 10 minutes between drops. (Keep eyes closed for 2 minutes after each drop)   Polyethyl Glycol-Propyl Glycol 0.4-0.3 % Soln Place 1 drop into the left eye as needed. Wait 10 minutes between drops. (Keep eyes closed for 2 minutes after each drop)   Rhopressa 0.02 % Soln Generic drug: Netarsudil Dimesylate Apply 1 drop to eye every evening. Apply 1 drop to right eye every evening. Wait 5 mins between drops.   Vitamin D3 50 MCG (2000 UT) Tabs Take by mouth.        Review of Systems  Unable to perform ROS: Dementia    Immunization History   Administered Date(s) Administered   Influenza, High Dose Seasonal PF 09/26/2015, 09/03/2017, 08/28/2018, 09/23/2020, 09/07/2022   Influenza,inj,Quad PF,6+ Mos 10/01/2019   Influenza-Unspecified 09/22/2009, 08/27/2011, 10/08/2012, 12/08/2014, 02/10/2018, 09/06/2021   Moderna Covid-19 Vaccine Bivalent Booster 67yr & up 04/19/2022   Moderna Sars-Covid-2 Vaccination 12/14/2019, 01/12/2020, 09/20/2020, 10/13/2020   Pneumococcal Conjugate-13 12/31/2013   Pneumococcal Polysaccharide-23 12/04/1999, 02/02/2016   Td 12/04/2000   Tdap 11/07/2012   Zoster Recombinat (Shingrix) 04/01/2022, 06/01/2022   Pertinent  Health Maintenance Due  Topic Date Due   INFLUENZA VACCINE  Completed   DEXA SCAN  Completed      11/09/2020   10:15 AM 02/10/2021   11:01 AM 05/03/2021   10:14 AM 02/07/2022   11:26 AM 03/30/2022    9:35 AM  Fall Risk  Falls in the past year? 0 0 0 0 0  Was there an injury with Fall? 0 0  0 0  Fall Risk Category Calculator 0 0  0 0  Fall Risk Category Low Low  Low Low  Patient Fall Risk Level Low fall risk Low fall risk  Low fall risk Low fall risk  Patient at Risk for Falls Due to    No Fall Risks   Fall risk Follow up    Falls evaluation completed    Functional Status Survey:    Vitals:   09/18/22 1131  BP: 138/73  Pulse: 88  Resp: 16  Temp: 97.7 F (36.5 C)  SpO2: 97%  Weight: 128 lb 9.6 oz (58.3 kg)  Height: '5\' 5"'$  (1.651 m)   Body mass index is 21.4 kg/m. Physical Exam Vitals reviewed.  Constitutional:      General: She is not in acute distress. HENT:     Head: Normocephalic.     Right Ear: There is no impacted cerumen.     Left Ear: There is no impacted cerumen.     Nose: Nose normal.     Mouth/Throat:     Mouth: Mucous membranes are moist.  Eyes:     General:        Right eye: No discharge.        Left eye: No discharge.  Cardiovascular:     Rate and Rhythm: Normal rate and regular rhythm.     Pulses: Normal pulses.     Heart sounds: Normal heart  sounds.  Pulmonary:     Effort: Pulmonary effort is normal. No respiratory distress.     Breath sounds: Normal breath sounds. No wheezing.  Abdominal:     General: Bowel sounds are normal. There is no distension.     Palpations: Abdomen is soft.     Tenderness: There is no abdominal tenderness.  Musculoskeletal:     Cervical back: Neck supple.     Right lower leg: No edema.     Left lower leg: No edema.  Skin:    General: Skin is warm and dry.     Capillary Refill: Capillary refill takes less than 2 seconds.     Comments: Scratch marks to upper chest, no skin breakdown/infection  Neurological:     General: No focal deficit present.     Mental Status: She is alert. Mental status is at baseline.     Motor: Weakness present.     Gait: Gait abnormal.  Psychiatric:        Mood and Affect: Mood normal.        Behavior: Behavior normal.     Comments: Very pleasant, follows commands, alert to self/familiar face     Labs reviewed: Recent Labs    11/07/21 0000 06/12/22 0000  NA 140 142  K 3.6 4.1  CL 104 105  CO2 21 24*  BUN 15 13  CREATININE 0.9 0.1*  CALCIUM 8.5* 8.9   Recent Labs    11/07/21 0000  AST 24  ALT 15  ALKPHOS 97  ALBUMIN 3.9   Recent Labs    11/07/21 0000 06/12/22 0000  WBC 4.7 5.4  HGB 12.1 12.8  HCT 35* 34*  PLT 138* 171   Lab Results  Component Value Date   TSH 1.91 11/21/2021   No results found for: "HGBA1C" Lab Results  Component Value Date   CHOL 241 (A) 09/10/2019   HDL 99 (A) 09/10/2019  [place] 128 09/10/2019   TRIG 72 09/10/2019    Significant Diagnostic Results in last 30 days:  No results found.  Assessment/Plan 1. Mixed Alzheimer's and vascular dementia ([place]) - doing well in memory care - dependent with some ADLs - ambulates with walker - cont Ariceot and Namenda  2. Generalized convulsive epilepsy without intractable epilepsy ([place]) - no recent episodes - cont Lamicatal  3. Multiple thyroid nodules - involves  both lobes - repeat U/S 11/2022  4. Essential hypertension - controlled - off medications  5. Senile osteoporosis - DEXA 2021, t score - 5.2 - cont Fosamax  6. Vitamin B 12 deficiency - cont cyanocobalamin  7. Primary open angle glaucoma (POAG) of both eyes, moderate stage - cont brimonidine/bimatoprost/dorzolamide-timolol/pilocarpine    Family/ staff Communication: plan discussed with patient and nurse  Labs/tests ordered: none

## 2022-10-12 ENCOUNTER — Emergency Department (HOSPITAL_COMMUNITY): Payer: Medicare Other

## 2022-10-12 ENCOUNTER — Other Ambulatory Visit: Payer: Self-pay

## 2022-10-12 ENCOUNTER — Encounter (HOSPITAL_COMMUNITY): Payer: Self-pay | Admitting: Emergency Medicine

## 2022-10-12 ENCOUNTER — Emergency Department (HOSPITAL_COMMUNITY)
Admission: EM | Admit: 2022-10-12 | Discharge: 2022-10-12 | Disposition: A | Discharge status: Home / Self Care | Payer: Medicare Other | Attending: Student | Admitting: Student

## 2022-10-12 DIAGNOSIS — S7002XA Contusion of left hip, initial encounter: Secondary | ICD-10-CM | POA: Diagnosis not present

## 2022-10-12 DIAGNOSIS — S32501A Unspecified fracture of right pubis, initial encounter for closed fracture: Secondary | ICD-10-CM | POA: Diagnosis not present

## 2022-10-12 DIAGNOSIS — S6992XA Unspecified injury of left wrist, hand and finger(s), initial encounter: Secondary | ICD-10-CM | POA: Diagnosis not present

## 2022-10-12 DIAGNOSIS — R519 Headache, unspecified: Secondary | ICD-10-CM | POA: Diagnosis not present

## 2022-10-12 DIAGNOSIS — W19XXXA Unspecified fall, initial encounter: Secondary | ICD-10-CM | POA: Diagnosis not present

## 2022-10-12 DIAGNOSIS — R6889 Other general symptoms and signs: Secondary | ICD-10-CM | POA: Diagnosis not present

## 2022-10-12 DIAGNOSIS — M11232 Other chondrocalcinosis, left wrist: Secondary | ICD-10-CM | POA: Diagnosis not present

## 2022-10-12 DIAGNOSIS — T699XXA Effect of reduced temperature, unspecified, initial encounter: Secondary | ICD-10-CM | POA: Diagnosis not present

## 2022-10-12 DIAGNOSIS — R2689 Other abnormalities of gait and mobility: Secondary | ICD-10-CM | POA: Diagnosis not present

## 2022-10-12 DIAGNOSIS — M19042 Primary osteoarthritis, left hand: Secondary | ICD-10-CM | POA: Diagnosis not present

## 2022-10-12 DIAGNOSIS — M25552 Pain in left hip: Secondary | ICD-10-CM | POA: Diagnosis not present

## 2022-10-12 DIAGNOSIS — F0390 Unspecified dementia without behavioral disturbance: Secondary | ICD-10-CM | POA: Insufficient documentation

## 2022-10-12 DIAGNOSIS — J449 Chronic obstructive pulmonary disease, unspecified: Secondary | ICD-10-CM | POA: Diagnosis not present

## 2022-10-12 DIAGNOSIS — Z743 Need for continuous supervision: Secondary | ICD-10-CM | POA: Diagnosis not present

## 2022-10-12 DIAGNOSIS — S32591A Other specified fracture of right pubis, initial encounter for closed fracture: Secondary | ICD-10-CM

## 2022-10-12 DIAGNOSIS — R079 Chest pain, unspecified: Secondary | ICD-10-CM | POA: Diagnosis not present

## 2022-10-12 DIAGNOSIS — S32592A Other specified fracture of left pubis, initial encounter for closed fracture: Secondary | ICD-10-CM

## 2022-10-12 DIAGNOSIS — M25531 Pain in right wrist: Secondary | ICD-10-CM | POA: Diagnosis not present

## 2022-10-12 DIAGNOSIS — S32512A Fracture of superior rim of left pubis, initial encounter for closed fracture: Secondary | ICD-10-CM | POA: Diagnosis not present

## 2022-10-12 DIAGNOSIS — R9082 White matter disease, unspecified: Secondary | ICD-10-CM | POA: Diagnosis not present

## 2022-10-12 DIAGNOSIS — I10 Essential (primary) hypertension: Secondary | ICD-10-CM | POA: Insufficient documentation

## 2022-10-12 DIAGNOSIS — M8580 Other specified disorders of bone density and structure, unspecified site: Secondary | ICD-10-CM | POA: Diagnosis not present

## 2022-10-12 DIAGNOSIS — M5136 Other intervertebral disc degeneration, lumbar region: Secondary | ICD-10-CM | POA: Diagnosis not present

## 2022-10-12 DIAGNOSIS — M25532 Pain in left wrist: Secondary | ICD-10-CM | POA: Diagnosis not present

## 2022-10-12 DIAGNOSIS — M542 Cervicalgia: Secondary | ICD-10-CM | POA: Diagnosis not present

## 2022-10-12 DIAGNOSIS — S7012XA Contusion of left thigh, initial encounter: Secondary | ICD-10-CM | POA: Diagnosis not present

## 2022-10-12 DIAGNOSIS — M79643 Pain in unspecified hand: Secondary | ICD-10-CM

## 2022-10-12 LAB — CBC
HCT: 36.2 % (ref 36.0–46.0)
Hemoglobin: 12 g/dL (ref 12.0–15.0)
MCH: 31.8 pg (ref 26.0–34.0)
MCHC: 33.1 g/dL (ref 30.0–36.0)
MCV: 96 fL (ref 80.0–100.0)
Platelets: 142 10*3/uL — ABNORMAL LOW (ref 150–400)
RBC: 3.77 MIL/uL — ABNORMAL LOW (ref 3.87–5.11)
RDW: 14.2 % (ref 11.5–15.5)
WBC: 9.5 10*3/uL (ref 4.0–10.5)
nRBC: 0 % (ref 0.0–0.2)

## 2022-10-12 LAB — BASIC METABOLIC PANEL
Anion gap: 8 (ref 5–15)
BUN: 13 mg/dL (ref 8–23)
CO2: 25 mmol/L (ref 22–32)
Calcium: 8.5 mg/dL — ABNORMAL LOW (ref 8.9–10.3)
Chloride: 107 mmol/L (ref 98–111)
Creatinine, Ser: 0.81 mg/dL (ref 0.44–1.00)
GFR, Estimated: >60 mL/min (ref >60)
Glucose, Bld: 111 mg/dL — ABNORMAL HIGH (ref 70–99)
Potassium: 3.7 mmol/L (ref 3.5–5.1)
Sodium: 140 mmol/L (ref 135–145)

## 2022-10-12 MED ORDER — OXYCODONE-ACETAMINOPHEN 5-325 MG PO TABS
1.0000 | ORAL_TABLET | Freq: Once | ORAL | Status: AC
  Administered 2022-10-12: 1 via ORAL
  Filled 2022-10-12: qty 1

## 2022-10-12 MED ORDER — HYDROCODONE-ACETAMINOPHEN 5-325 MG PO TABS: 1.0000 | ORAL_TABLET | Freq: Four times a day (QID) | ORAL | 0 refills | Status: DC | PRN

## 2022-10-12 NOTE — Progress Notes (Signed)
Orthopedic Tech Progress Note Patient Details:  Tracy Nielsen [birth year] [id number]  Ortho Devices Type of Ortho Device: Thumb velcro splint Ortho Device/Splint Location: Left thumb Ortho Device/Splint Interventions: Application   Post Interventions Patient Tolerated: Well  Tracy Nielsen 10/12/2022, 9:26 AM

## 2022-10-12 NOTE — Progress Notes (Signed)
This CSW was contacted by Dorise Bullion, PA, to inquire about whether the pt can receive PT at her current facility or if she will need to be placed at [facility]. The pt is currently a resident at [facility] in their [facility]. Per Elmo Putt, the pt has a fractured hip and is experiencing pain. This CSW contacted the pt's relative, [name], who states the pt already receives speech and OT services at [facility] and that it should not be a problem for her to receive PT. This CSW escalated [name]'s questions regarding possible admission to [place], [place], who will reach out to the family to discuss the pt's medical status. This CSW contacted [facility] and spoke with Rick Duff who states that the pt can certainly receive PT upon returning to the facility. Junie Panning states that their PT services are in house and they can get her on the schedule once she returns. Junie Panning states the only thing they could use is a PT order from the hospital but if one is not provided, they can still schedule her for PT. This CSW notified [place], [place].

## 2022-10-12 NOTE — Discharge Instructions (Addendum)
Your evaluated in the emergency department today for injuries following a fall.  Imaging today indicates 2 small nondisplaced fracture in the pelvis, numbness to the pubic ramus.  These are mild, and you may continue to weight-bear as tolerated.  You need to follow-up with orthopedics in the next 2 to 3 weeks for repeat evaluation.  Dr. Trevor Mace contact information has been provided, please call to schedule this follow-up appointment.  Also considering where the tenderness is located in your wrist, sometimes a fracture may not initially present here after an injury.  You need to have repeat imaging of your left wrist in 2 to 3 weeks as well, this may be done when you see Dr. Ninfa Linden.  Continue to manage pain as needed with over-the-counter pain medicine.  A prescription for stronger pain medicaiton has been sent to your pharmacy to use as needed.    A prescription for physical therapy has also been ordered on her behalf.  It is important to focus goal of ambulation, also with focus of some strengthening with AROM and PROM as well, due to difficulty with pain in addition to age and dementia status.  Please utilize the walker with ambulation at all times.  Return to the ED for any new or worsening symptoms as discussed.

## 2022-10-12 NOTE — ED Provider Notes (Signed)
Physical Exam  BP 131/80 (BP Location: Right Arm)   Pulse 90   Temp 98.3 F (36.8 C) (Oral)   Resp 18   Ht '5\' 5"'$  (1.651 m)   Wt 58 kg   SpO2 93%   BMI 21.28 kg/m   Physical Exam Vitals and nursing note reviewed.  Constitutional:      General: She is not in acute distress.    Appearance: She is not ill-appearing, toxic-appearing or diaphoretic.  Neurological:     Mental Status: She is alert.     Procedures  Procedures  ED Course / MDM   Clinical Course as of 10/12/22 1104  Fri Oct 12, 2022  0821 Consulted with Dr. Ninfa Linden of orthopedic surgery.  Reviewed patient case and reviewed imaging.  Discussed mild fractures of the pubic ramus, he believes the patient can be weightbearing as tolerated with outpatient follow-up in the office in 2 to 3 weeks.  No other recommendations at this time. [AC]  [phone] Consulted with Corey Skains as social work group.  Discussed patient case in detail.  Patient comes from [facility] living facility and has assistance with her dementia in the memory support group, however does not currently have physical therapy.  Informed by Corey Skains that they do have physical therapy though onsite, and can provide treatment for the patient whether an outpatient prescription for home health PT is provided or not. [AC]  [place] patient's son [name] back by his request.  Discussed patient's course in the emergency department today.  Patient's son is aware of her hip fracture and that she is receiving a walker in the emergency department today.  Recommended discharge with recommendation to focus on ambulation difficulty due to pain, patient's facility has PT capabilities.  He understands patient is to follow-up with orthopedic specialist in 2 to 3 weeks for reevaluation of hip and left wrist.  He demonstrates verbal understanding and has no further questions. [AC]    Clinical Course User Index [AC] Prince Rome, PA-C   Medical Decision Making Amount and/or Complexity of  Data Reviewed Radiology: ordered.  Risk Prescription drug management.   Patient signed out to me at shift change.  Please see previous provider note for further details.  In short, this is a 88 year old female presenting to ED from recent unwitnessed fall.  Occurred earlier today.  Hx limited due to underlying dementia.  Not on anticoagulation.  Hx includes COPD, senile osteoporosis, hypertension, OA, sacral radiculopathy, and epilepsy.    Plan at time of hand off dependent on CT imaging of hip.  If negative, close outpatient follow up and may return to living facility.  If positive, consult orthopedics.  Results for orders placed or performed during the hospital encounter of 11/91/47  Basic metabolic panel  Result Value Ref Range   Sodium 140 135 - 145 mmol/L   Potassium 3.7 3.5 - 5.1 mmol/L   Chloride 107 98 - 111 mmol/L   CO2 25 22 - 32 mmol/L   Glucose, Bld 111 (H) 70 - 99 mg/dL   BUN 13 8 - 23 mg/dL   Creatinine, Ser 0.81 0.44 - 1.00 mg/dL   Calcium 8.5 (L) 8.9 - 10.3 mg/dL   GFR, Estimated >60 >60 mL/min   Anion gap 8 5 - 15  CBC  Result Value Ref Range   WBC 9.5 4.0 - 10.5 K/uL   RBC 3.77 (L) 3.87 - 5.11 MIL/uL   Hemoglobin 12.0 12.0 - 15.0 g/dL   HCT 36.2  36.0 - 46.0 %   MCV 96.0 80.0 - 100.0 fL   MCH 31.8 26.0 - 34.0 pg   MCHC 33.1 30.0 - 36.0 g/dL   RDW 14.2 11.5 - 15.5 %   Platelets 142 (L) 150 - 400 K/uL   nRBC 0.0 0.0 - 0.2 %   CT Hip Left Wo Contrast  Result Date: 10/12/2022 CLINICAL DATA:  88 year old female status post fall with left hip pain, negative plain radiographs. EXAM: CT OF THE LEFT HIP WITHOUT CONTRAST TECHNIQUE: Multidetector CT imaging of the left hip was performed according to the standard protocol. Multiplanar CT image reconstructions were also generated. RADIATION DOSE REDUCTION: This exam was performed according to the departmental dose-optimization program which includes automated exposure control, adjustment of the mA and/or kV according to  patient size and/or use of iterative reconstruction technique. COMPARISON:  Left hip radiographs 0406 hours this morning. CT Abdomen and Pelvis 11/10/2007. FINDINGS: Partially visible simple fluid density right adnexal cyst, with evidence this was present in 2008 (no follow-up imaging recommended). No pelvis free fluid. Negative visible bowel loops aside from retained stool. Calcified atherosclerosis. No left inguinal or pelvic sidewall lymphadenopathy. Mild subcutaneous fat stranding/contusion lateral to the proximal left femur on series 3, image 63. No measurable hematoma. No soft tissue gas. Osteopenia. There is a nondisplaced left inferior pubic ramus fracture on series 2, image 46 which appears acute. The remaining visible left hemipelvis including the left acetabulum and superior pubic ramus appear intact. But there is also a nondisplaced fracture of the contralateral right pubic ramus 1 cm from the symphysis (series 4, image 21). Pubic symphysis remains aligned without evidence of diastasis. Left femoral head is normally located and appears stable, intact. No proximal left femur fracture identified. IMPRESSION: 1. Osteopenia with nondisplaced fractures of the right superior pubic ramus near the symphysis, and the left inferior pubic ramus. No symphysis diastasis. 2. No proximal left femur fracture identified by CT. Mild subcutaneous contusion lateral to the femur. Electronically Signed   By: Genevie Ann M.D.   On: 10/12/2022 07:15   DG Chest Portable 1 View  Result Date: 10/12/2022 CLINICAL DATA:  88 year old female status post fall with pain. EXAM: PORTABLE CHEST 1 VIEW COMPARISON:  Chest radiographs 08/16/2010. FINDINGS: Portable AP semi upright view at 0547 hours. Lower lung volumes. Cardiac size remains within normal limits. Evidence of increased tortuosity of the thoracic aorta since 20/11. Other mediastinal contours are within normal limits. Visualized tracheal air column is within normal limits.  Allowing for portable technique the lungs are clear. No pneumothorax or pleural effusion. Chronic left chest wall/breast surgical clips were present in 2011. Right lower neck surgical clips are new. No acute osseous abnormality identified. Paucity of bowel gas in the upper abdomen. IMPRESSION: No acute cardiopulmonary abnormality or acute traumatic injury identified. Electronically Signed   By: Genevie Ann M.D.   On: 10/12/2022 06:08   DG Wrist Complete Left  Result Date: 10/12/2022 CLINICAL DATA:  88 year old female status post fall with pain. EXAM: LEFT WRIST - COMPLETE 3+ VIEW COMPARISON:  Left thumb series 0411 hours today. Previous left wrist series 05/08/2018. FINDINGS: Left thumb appears stable to that described earlier today. Mild distal left radius impaction demonstrated in 2019 appears healed. Bone mineralization is within normal limits for age. Carpal bone alignment is maintained. There is chronic chondrocalcinosis at the left wrist. No acute fracture or dislocation identified. IMPRESSION: No acute fracture or dislocation identified about the left wrist. Electronically Signed   By: Lemmie Evens  [place] Crane M.D.   On: 10/12/2022 06:06   CT Cervical Spine Wo Contrast  Result Date: 10/12/2022 CLINICAL DATA:  88 year old female status post fall at home this morning. Pain. EXAM: CT CERVICAL SPINE WITHOUT CONTRAST TECHNIQUE: Multidetector CT imaging of the cervical spine was performed without intravenous contrast. Multiplanar CT image reconstructions were also generated. RADIATION DOSE REDUCTION: This exam was performed according to the departmental dose-optimization program which includes automated exposure control, adjustment of the mA and/or kV according to patient size and/or use of iterative reconstruction technique. COMPARISON:  Head CT today.  Cervical spine radiographs 03/19/2011. FINDINGS: Alignment: Chronic straightening and mild reversal of cervical lordosis appears stable since 2012. Cervicothoracic junction  alignment is within normal limits. Bilateral posterior element alignment is within normal limits. Skull base and vertebrae: Bone mineralization is within normal limits for age. C1 and C2 appear intact and aligned. No acute osseous abnormality identified. Soft tissues and spinal canal: No prevertebral fluid or swelling. No visible canal hematoma. Negative visible noncontrast neck soft tissues aside from carotid calcified atherosclerosis. Disc levels: Chronic cervical disc and endplate degeneration, advanced but stable since 2012 C4-C5 through C6-C7. Up to mild associated cervical spinal stenosis. Upper chest: Visible upper thoracic levels appear intact. Calcified aortic atherosclerosis. Partially visible posterior right upper lobe indistinct pulmonary opacity on series 4, image 105. No superior mediastinal lymphadenopathy. IMPRESSION: 1. No acute traumatic injury identified in the cervical spine. 2. Partially visible right upper lobe lung opacity, nonspecific. Recommend follow-up chest radiographs. 3. Chronic cervical disc and endplate degeneration with up to mild associated cervical spinal stenosis. 4.  Aortic Atherosclerosis (ICD10-I70.0). Electronically Signed   By: Genevie Ann M.D.   On: 10/12/2022 05:09   CT HEAD WO CONTRAST (5MM)  Result Date: 10/12/2022 CLINICAL DATA:  88 year old female status post fall at home this morning. Pain. EXAM: CT HEAD WITHOUT CONTRAST TECHNIQUE: Contiguous axial images were obtained from the base of the skull through the vertex without intravenous contrast. RADIATION DOSE REDUCTION: This exam was performed according to the departmental dose-optimization program which includes automated exposure control, adjustment of the mA and/or kV according to patient size and/or use of iterative reconstruction technique. COMPARISON:  Brain MRI 08/08/2018.  Head CT 02/28/2011. FINDINGS: Brain: No midline shift, ventriculomegaly, mass effect, evidence of mass lesion, intracranial hemorrhage or  evidence of cortically based acute infarction. Patchy and confluent bilateral cerebral white matter hypodensity has significantly progressed since 2012, but similar to MRI FLAIR signal abnormality in 2019. Some deep white matter capsule involvement, but the deep gray nuclei appear relatively spared. Elsewhere gray-white matter differentiation is within normal limits for age. Vascular: Mild Calcified atherosclerosis at the skull base. No suspicious intracranial vascular hyperdensity. Skull: Intact, no fracture identified. Sinuses/Orbits: Visualized paranasal sinuses and mastoids are stable and well aerated. Other: Postoperative changes to both globes. No orbit or scalp soft tissue injury identified. IMPRESSION: 1. No acute intracranial abnormality or acute traumatic injury identified. 2. Chronic cerebral white matter disease. Electronically Signed   By: Genevie Ann M.D.   On: 10/12/2022 05:06   DG Finger Thumb Left  Result Date: 10/12/2022 CLINICAL DATA:  88 year old female status post fall at home this morning. Pain. EXAM: LEFT THUMB 2+V COMPARISON:  Left wrist series 05/08/2018. FINDINGS: Left thumb MCP and IP joint space loss with subchondral sclerosis and bulky osteophytosis. Degenerative appearing subluxation at the MCP joint is chronic, was present in 2019. No acute fracture or dislocation identified. IMPRESSION: Osteoarthritis. No acute fracture or dislocation identified about the left  thumb. Electronically Signed   By: Genevie Ann M.D.   On: 10/12/2022 04:30   DG Hip Unilat W or Wo Pelvis 2-3 Views Left  Result Date: 10/12/2022 CLINICAL DATA:  88 year old female status post fall at home this morning. Pain. EXAM: DG HIP (WITH OR WITHOUT PELVIS) 2-3V LEFT COMPARISON:  CT Abdomen and Pelvis 11/10/2007. FINDINGS: Femoral heads remain normally located. Hip joint spaces appear symmetric and normal for age. Grossly intact proximal right femur. No pelvis fracture identified. Partially visible multilevel advanced  lumbar disc and endplate degeneration. Nonobstructed bowel gas pattern in the lower abdomen and pelvis. AP and cross-table lateral views of the left hip. The proximal left femur appears intact. Probable benign bone island left iliac wing on these images. IMPRESSION: No acute fracture or dislocation identified about the left hip or pelvis. If occult hip fracture is suspected or if the patient is unable to weightbear, MRI is the preferred modality for further evaluation. Electronically Signed   By: Genevie Ann M.D.   On: 10/12/2022 04:29     Workup pertinent findings: --CT head and CT C-spine: Negative --XR left hip: Negative --CT left hip: 1. Osteopenia with nondisplaced fractures of the right superior pubic ramus near the symphysis, and the left inferior pubic ramus. No symphysis diastasis. 2. No proximal left femur fracture identified by CT. Mild subcutaneous contusion lateral to the femur. --Anatomical snuff box tenderness with negative XR findings, placed in thumb spica due to possible occult scaphoid fracture.  Upon re-evaluation, pt states she does not have pain in the wrist.  Anatomical snuff box tenderness minimal to none on my exam.  Still plan to proceed with thumb spica due to initial tenderness.  Patient exhibits remarkable difficulty attempting to ambulate, complaining of left hip pain.  This is again where the set of fractures was identified.  I believe the patient will likely need physical therapy assessment and/or physical therapy regimen for assistance, considering injury, age, and dementia.  Plan to consult with social work for further recommendations and assistance.  Consulted with Corey Skains of social work, see note above.  PT eval in the ED not required prior to PT treatment available at patient's living facility.  Plan to write prescription for home health PT, therefore did not believe SNF placement is necessary at this time.  Consultations --Orthopedics, see note above.  Plan for discharge  with outpatient follow up in Dr. Trevor Mace office in 2-3 weeks for re-evaluation.  Repeat XR of left wrist at that time as well for possible scaphoid fracture. --Social work, see note above.   Plan --After consideration the patient's encounter today, I do not feel today's workup suggests an emergent condition requiring admission or immediate intervention beyond what has been performed at this time.  Safe for discharge; instructed to return immediately for worsening symptoms, change in symptoms or any other concerns.  I have reviewed the patients home medicines and have made adjustments as needed.  Discussed course of treatment with the patient, whom demonstrates overall understanding.  AVS printed and provided to pt to bring with her back to facility with further instructions and review of information discussed today.  Patient and her son in agreement and have no further questions.     This chart was dictated using voice recognition software.  Despite best efforts to proofread, errors can occur which can change the documentation meaning.

## 2022-10-12 NOTE — ED Triage Notes (Signed)
Pt BIB EMS from [facility] after falling with c/o left hip and left thumb pain.

## 2022-10-12 NOTE — ED Provider Notes (Signed)
[facility] DEPT Provider Note   CSN: [id number] Arrival date & time: 10/12/22  0345     History  Chief Complaint  Patient presents with   Lytle Michaels    P[place]ola Dimmitt Mccullars is a 88 y.o. female who presents via EMS from [facility] after unwitnessed fall today at her facility.  Patient endorsing no pain and states she does not know what happened, however on exam patient is tender with left thumb and left hip pain.  No further insight obtained from patient given her underlying dementia.  I personally reviewed her medical records.  Has history of COPD, senile osteoporosis, hypertension, OA, sacral radiculopathy, MGUS.  She is not anticoagulated.  Does also have a history of epilepsy.  HPI     Home Medications Prior to Admission medications   Medication Sig Start Date End Date Taking? Authorizing Provider  acetaminophen (TYLENOL) 325 MG tablet Take 650 mg by mouth every 8 (eight) hours as needed.    [provider]  alendronate (FOSAMAX) 70 MG tablet Take 70 mg by mouth once a week. Take with a full glass of water on an empty stomach.    [provider]  bimatoprost (LUMIGAN) 0.03 % ophthalmic solution Place 1 drop into both eyes at bedtime.    [provider]  brimonidine (ALPHAGAN) 0.2 % ophthalmic solution Place 1 drop into the right eye 3 (three) times daily. Wait 10 minutes between drops. (Keep eyes closed for 2 minutes after each drop) 08/04/19   [provider]  chlorhexidine (PERIDEX) 0.12 % solution Use as directed 5 mLs in the mouth or throat at bedtime. 1 tsp    [provider]  Cholecalciferol (VITAMIN D3) 50 MCG (2000 UT) TABS Take by mouth.    [provider]  cyanocobalamin 1000 MCG tablet Take 1,000 mcg by mouth in the morning.    [provider]  diclofenac Sodium (VOLTAREN) 1 % GEL Apply 4 g topically as needed (Apply to left hip prn for pain or legft thumb pain).    [provider]  donepezil (ARICEPT) 10 MG tablet Take 10 mg by mouth at bedtime.    [provider]  dorzolamide-timolol (COSOPT) 22.3-6.8 MG/ML ophthalmic solution Place 1 drop into the right eye 2 (two) times daily. Wait 10 minutes between drops. (Keep eyes closed for 2 minutes after each drop) 08/03/14   [provider]  Eyelid Cleansers (OCUSOFT EYELID CLEANSING) PADS Apply topically. Massage LEFT eyelid/lash line for 3 mins BID As Needed    [provider]  lamoTRIgine (LAMICTAL) 100 MG tablet Take 1 tablet in morning, 1 and 1/2 tablets at night 06/29/19   Suzzanne Cloud, NP  lamoTRIgine (LAMICTAL) 100 MG tablet Take 100 mg by mouth daily. Daily in the morning.    [provider]  memantine (NAMENDA) 5 MG tablet Take 5 mg by mouth 2 (two) times daily.    [provider]  Multiple Vitamin (MULTIVITAMIN) tablet Take 1 tablet by mouth daily.    [provider]  Netarsudil Dimesylate (RHOPRESSA) 0.02 % SOLN Apply 1 drop to eye every evening. Apply 1 drop to right eye every evening. Wait 5 mins between drops.    [provider]  pilocarpine (PILOCAR) 4 % ophthalmic solution Place 1 drop into the right eye 4 (four) times daily. Wait 10 minutes between drops. (Keep eyes closed for 2 minutes after each drop) 08/04/19   [provider]  Polyethyl Glycol-Propyl Glycol 0.4-0.3 % SOLN  Place 1 drop into the left eye as needed. Wait 10 minutes between drops. (Keep eyes closed for 2 minutes after each drop)    [provider]      Allergies    Hctz [hydrochlorothiazide] and Other    Review of Systems   Review of Systems  Unable to perform ROS: Dementia    Physical Exam Updated Vital Signs BP (!) 162/82   Pulse 85   Temp 97.9 F (36.6 C) (Oral)   Resp 17   Ht '5\' 5"'$  (1.651 m)   Wt 58 kg   SpO2 93%   BMI 21.28 kg/m  Physical Exam Vitals and nursing note reviewed.  Constitutional:      Appearance: She is not  ill-appearing or toxic-appearing.  HENT:     Head: Normocephalic and atraumatic.     Nose: Nose normal.     Mouth/Throat:     Mouth: Mucous membranes are moist.     Pharynx: No oropharyngeal exudate or posterior oropharyngeal erythema.  Eyes:     General: No scleral icterus.       Right eye: No discharge.        Left eye: No discharge.     Extraocular Movements: Extraocular movements intact.     Conjunctiva/sclera: Conjunctivae normal.     Pupils: Pupils are equal, round, and reactive to light.  Cardiovascular:     Rate and Rhythm: Normal rate and regular rhythm.     Pulses: Normal pulses.     Heart sounds: Normal heart sounds. No murmur heard. Pulmonary:     Effort: Pulmonary effort is normal. No respiratory distress.     Breath sounds: Normal breath sounds. No wheezing or rales.  Chest:     Chest wall: No mass, lacerations, deformity, swelling, tenderness, crepitus or edema.  Abdominal:     General: Bowel sounds are normal. There is no distension.     Palpations: Abdomen is soft.     Tenderness: There is no abdominal tenderness. There is no guarding or rebound.  Musculoskeletal:        General: No deformity.     Right shoulder: Normal.     Left shoulder: Normal.     Right upper arm: Normal.     Left upper arm: Normal.     Right elbow: Normal.     Left elbow: Normal.     Right forearm: Normal.     Left forearm: Normal.     Right wrist: Normal.     Left wrist: Snuff box tenderness present.     Right hand: Normal.     Left hand: Tenderness present. No bony tenderness. Normal range of motion. Normal capillary refill. Normal pulse.     Cervical back: Normal and neck supple. No bony tenderness.     Thoracic back: Normal. No bony tenderness.     Lumbar back: Normal. No bony tenderness.     Right hip: Normal.     Left hip: Tenderness present. No bony tenderness or crepitus. Decreased range of motion. Normal strength.     Right upper leg: Normal.     Left upper leg: Normal.      Right knee: Normal.     Left knee: Normal.     Right lower leg: Normal. No edema.     Left lower leg: Normal. No edema.     Right ankle: Normal.     Right Achilles Tendon: Normal.     Left ankle: Normal.     Left Achilles  Tendon: Normal.     Right foot: Normal.     Left foot: Normal.  Skin:    General: Skin is warm and dry.     Capillary Refill: Capillary refill takes less than 2 seconds.  Neurological:     General: No focal deficit present.     Mental Status: She is alert and oriented to person, place, and time. Mental status is at baseline.  Psychiatric:        Mood and Affect: Mood normal.     ED Results / Procedures / Treatments   Labs (all labs ordered are listed, but only abnormal results are displayed) Labs Reviewed - No data to display  EKG None  Radiology CT Hip Left Wo Contrast  Result Date: 10/12/2022 CLINICAL DATA:  88 year old female status post fall with left hip pain, negative plain radiographs. EXAM: CT OF THE LEFT HIP WITHOUT CONTRAST TECHNIQUE: Multidetector CT imaging of the left hip was performed according to the standard protocol. Multiplanar CT image reconstructions were also generated. RADIATION DOSE REDUCTION: This exam was performed according to the departmental dose-optimization program which includes automated exposure control, adjustment of the mA and/or kV according to patient size and/or use of iterative reconstruction technique. COMPARISON:  Left hip radiographs 0406 hours this morning. CT Abdomen and Pelvis 11/10/2007. FINDINGS: Partially visible simple fluid density right adnexal cyst, with evidence this was present in 2008 (no follow-up imaging recommended). No pelvis free fluid. Negative visible bowel loops aside from retained stool. Calcified atherosclerosis. No left inguinal or pelvic sidewall lymphadenopathy. Mild subcutaneous fat stranding/contusion lateral to the proximal left femur on series 3, image 63. No measurable hematoma. No soft  tissue gas. Osteopenia. There is a nondisplaced left inferior pubic ramus fracture on series 2, image 46 which appears acute. The remaining visible left hemipelvis including the left acetabulum and superior pubic ramus appear intact. But there is also a nondisplaced fracture of the contralateral right pubic ramus 1 cm from the symphysis (series 4, image 21). Pubic symphysis remains aligned without evidence of diastasis. Left femoral head is normally located and appears stable, intact. No proximal left femur fracture identified. IMPRESSION: 1. Osteopenia with nondisplaced fractures of the right superior pubic ramus near the symphysis, and the left inferior pubic ramus. No symphysis diastasis. 2. No proximal left femur fracture identified by CT. Mild subcutaneous contusion lateral to the femur. Electronically Signed   By: Genevie Ann M.D.   On: 10/12/2022 07:15   DG Chest Portable 1 View  Result Date: 10/12/2022 CLINICAL DATA:  88 year old female status post fall with pain. EXAM: PORTABLE CHEST 1 VIEW COMPARISON:  Chest radiographs 08/16/2010. FINDINGS: Portable AP semi upright view at 0547 hours. Lower lung volumes. Cardiac size remains within normal limits. Evidence of increased tortuosity of the thoracic aorta since 20/11. Other mediastinal contours are within normal limits. Visualized tracheal air column is within normal limits. Allowing for portable technique the lungs are clear. No pneumothorax or pleural effusion. Chronic left chest wall/breast surgical clips were present in 2011. Right lower neck surgical clips are new. No acute osseous abnormality identified. Paucity of bowel gas in the upper abdomen. IMPRESSION: No acute cardiopulmonary abnormality or acute traumatic injury identified. Electronically Signed   By: Genevie Ann M.D.   On: 10/12/2022 06:08   DG Wrist Complete Left  Result Date: 10/12/2022 CLINICAL DATA:  88 year old female status post fall with pain. EXAM: LEFT WRIST - COMPLETE 3+ VIEW  COMPARISON:  Left thumb series 0411 hours today. Previous left  wrist series 05/08/2018. FINDINGS: Left thumb appears stable to that described earlier today. Mild distal left radius impaction demonstrated in 2019 appears healed. Bone mineralization is within normal limits for age. Carpal bone alignment is maintained. There is chronic chondrocalcinosis at the left wrist. No acute fracture or dislocation identified. IMPRESSION: No acute fracture or dislocation identified about the left wrist. Electronically Signed   By: Genevie Ann M.D.   On: 10/12/2022 06:06   CT Cervical Spine Wo Contrast  Result Date: 10/12/2022 CLINICAL DATA:  88 year old female status post fall at home this morning. Pain. EXAM: CT CERVICAL SPINE WITHOUT CONTRAST TECHNIQUE: Multidetector CT imaging of the cervical spine was performed without intravenous contrast. Multiplanar CT image reconstructions were also generated. RADIATION DOSE REDUCTION: This exam was performed according to the departmental dose-optimization program which includes automated exposure control, adjustment of the mA and/or kV according to patient size and/or use of iterative reconstruction technique. COMPARISON:  Head CT today.  Cervical spine radiographs 03/19/2011. FINDINGS: Alignment: Chronic straightening and mild reversal of cervical lordosis appears stable since 2012. Cervicothoracic junction alignment is within normal limits. Bilateral posterior element alignment is within normal limits. Skull base and vertebrae: Bone mineralization is within normal limits for age. C1 and C2 appear intact and aligned. No acute osseous abnormality identified. Soft tissues and spinal canal: No prevertebral fluid or swelling. No visible canal hematoma. Negative visible noncontrast neck soft tissues aside from carotid calcified atherosclerosis. Disc levels: Chronic cervical disc and endplate degeneration, advanced but stable since 2012 C4-C5 through C6-C7. Up to mild associated cervical  spinal stenosis. Upper chest: Visible upper thoracic levels appear intact. Calcified aortic atherosclerosis. Partially visible posterior right upper lobe indistinct pulmonary opacity on series 4, image 105. No superior mediastinal lymphadenopathy. IMPRESSION: 1. No acute traumatic injury identified in the cervical spine. 2. Partially visible right upper lobe lung opacity, nonspecific. Recommend follow-up chest radiographs. 3. Chronic cervical disc and endplate degeneration with up to mild associated cervical spinal stenosis. 4.  Aortic Atherosclerosis (ICD10-I70.0). Electronically Signed   By: Genevie Ann M.D.   On: 10/12/2022 05:09   CT HEAD WO CONTRAST (5MM)  Result Date: 10/12/2022 CLINICAL DATA:  88 year old female status post fall at home this morning. Pain. EXAM: CT HEAD WITHOUT CONTRAST TECHNIQUE: Contiguous axial images were obtained from the base of the skull through the vertex without intravenous contrast. RADIATION DOSE REDUCTION: This exam was performed according to the departmental dose-optimization program which includes automated exposure control, adjustment of the mA and/or kV according to patient size and/or use of iterative reconstruction technique. COMPARISON:  Brain MRI 08/08/2018.  Head CT 02/28/2011. FINDINGS: Brain: No midline shift, ventriculomegaly, mass effect, evidence of mass lesion, intracranial hemorrhage or evidence of cortically based acute infarction. Patchy and confluent bilateral cerebral white matter hypodensity has significantly progressed since 2012, but similar to MRI FLAIR signal abnormality in 2019. Some deep white matter capsule involvement, but the deep gray nuclei appear relatively spared. Elsewhere gray-white matter differentiation is within normal limits for age. Vascular: Mild Calcified atherosclerosis at the skull base. No suspicious intracranial vascular hyperdensity. Skull: Intact, no fracture identified. Sinuses/Orbits: Visualized paranasal sinuses and mastoids are  stable and well aerated. Other: Postoperative changes to both globes. No orbit or scalp soft tissue injury identified. IMPRESSION: 1. No acute intracranial abnormality or acute traumatic injury identified. 2. Chronic cerebral white matter disease. Electronically Signed   By: Genevie Ann M.D.   On: 10/12/2022 05:06   DG Finger Thumb Left  Result Date: 10/12/2022  CLINICAL DATA:  88 year old female status post fall at home this morning. Pain. EXAM: LEFT THUMB 2+V COMPARISON:  Left wrist series 05/08/2018. FINDINGS: Left thumb MCP and IP joint space loss with subchondral sclerosis and bulky osteophytosis. Degenerative appearing subluxation at the MCP joint is chronic, was present in 2019. No acute fracture or dislocation identified. IMPRESSION: Osteoarthritis. No acute fracture or dislocation identified about the left thumb. Electronically Signed   By: Genevie Ann M.D.   On: 10/12/2022 04:30   DG Hip Unilat W or Wo Pelvis 2-3 Views Left  Result Date: 10/12/2022 CLINICAL DATA:  88 year old female status post fall at home this morning. Pain. EXAM: DG HIP (WITH OR WITHOUT PELVIS) 2-3V LEFT COMPARISON:  CT Abdomen and Pelvis 11/10/2007. FINDINGS: Femoral heads remain normally located. Hip joint spaces appear symmetric and normal for age. Grossly intact proximal right femur. No pelvis fracture identified. Partially visible multilevel advanced lumbar disc and endplate degeneration. Nonobstructed bowel gas pattern in the lower abdomen and pelvis. AP and cross-table lateral views of the left hip. The proximal left femur appears intact. Probable benign bone island left iliac wing on these images. IMPRESSION: No acute fracture or dislocation identified about the left hip or pelvis. If occult hip fracture is suspected or if the patient is unable to weightbear, MRI is the preferred modality for further evaluation. Electronically Signed   By: Genevie Ann M.D.   On: 10/12/2022 04:29    Procedures Procedures    Medications Ordered  in ED Medications  oxyCODONE-acetaminophen (PERCOCET/ROXICET) 5-325 MG per tablet 1 tablet (1 tablet Oral Given 10/12/22 2633)    ED Course/ Medical Decision Making/ A&P                           Medical Decision Making 88 year old female presents with concern for unwitnessed fall from her facility.  Vital signs normal and intake.  Cardiopulmonary dam is normal, abdominal exam is benign.  Musculoskeletal exam as above with tenderness palpation over the left wrist in the snuffbox and the left hip.  Amount and/or Complexity of Data Reviewed Radiology: ordered.    Details: Plain film of the left thumb unremarkable.  Plain film of the hips and pelvis on the left negative, CT head negative for acute cranial abnormality CT C-spine without acute traumatic injury.  Risk Prescription drug management.   Care of this patient signed out to oncoming ED provider Dorise Bullion, PA-C at time of shift change.  Per HPI physical exam and laboratory findings were discussed with her prior to my partner.  Ultimately disposition pending completion of work-up which is awaiting CT left hip; ultimately disposition pending reevaluation and completion of work-up by oncoming ED team.  Patient was placed in thumb spica in the left wrist due to snuffbox tenderness on the left wrist.  Remains at her mental status baseline.  This chart was dictated using voice recognition software, Dragon. Despite the best efforts of this provider to proofread and correct errors, errors may still occur which can change documentation meaning.   Final Clinical Impression(s) / ED Diagnoses Final diagnoses:  None    Rx / DC Orders ED Discharge Orders     None         Aura Dials 10/12/22 0728    Teressa Lower, MD 10/13/22 1824

## 2022-10-12 NOTE — ED Notes (Signed)
Called report to memory care at [facility]

## 2022-10-16 ENCOUNTER — Encounter: Payer: Self-pay | Admitting: Orthopedic Surgery

## 2022-10-16 ENCOUNTER — Non-Acute Institutional Stay (SKILLED_NURSING_FACILITY): Payer: Medicare Other | Admitting: Orthopedic Surgery

## 2022-10-16 DIAGNOSIS — S3282XA Multiple fractures of pelvis without disruption of pelvic ring, initial encounter for closed fracture: Secondary | ICD-10-CM | POA: Diagnosis not present

## 2022-10-16 DIAGNOSIS — M25552 Pain in left hip: Secondary | ICD-10-CM | POA: Diagnosis not present

## 2022-10-16 DIAGNOSIS — H401132 Primary open-angle glaucoma, bilateral, moderate stage: Secondary | ICD-10-CM

## 2022-10-16 DIAGNOSIS — F0150 Vascular dementia without behavioral disturbance: Secondary | ICD-10-CM

## 2022-10-16 DIAGNOSIS — I10 Essential (primary) hypertension: Secondary | ICD-10-CM

## 2022-10-16 DIAGNOSIS — F0280 Dementia in other diseases classified elsewhere without behavioral disturbance: Secondary | ICD-10-CM

## 2022-10-16 DIAGNOSIS — E538 Deficiency of other specified B group vitamins: Secondary | ICD-10-CM

## 2022-10-16 DIAGNOSIS — G40309 Generalized idiopathic epilepsy and epileptic syndromes, not intractable, without status epilepticus: Secondary | ICD-10-CM

## 2022-10-16 DIAGNOSIS — G309 Alzheimer's disease, unspecified: Secondary | ICD-10-CM

## 2022-10-16 DIAGNOSIS — F419 Anxiety disorder, unspecified: Secondary | ICD-10-CM | POA: Diagnosis not present

## 2022-10-16 DIAGNOSIS — E042 Nontoxic multinodular goiter: Secondary | ICD-10-CM

## 2022-10-16 DIAGNOSIS — M810 Age-related osteoporosis without current pathological fracture: Secondary | ICD-10-CM | POA: Diagnosis not present

## 2022-10-16 MED ORDER — ACETAMINOPHEN 500 MG PO TABS: 500.0000 mg | ORAL_TABLET | Freq: Three times a day (TID) | ORAL | 0 refills | Status: AC

## 2022-10-16 MED ORDER — HYDROXYZINE PAMOATE 25 MG PO CAPS: 25.0000 mg | ORAL_CAPSULE | Freq: Every evening | ORAL | 0 refills | Status: AC | PRN

## 2022-10-16 MED ORDER — HYDROCODONE-ACETAMINOPHEN 5-325 MG PO TABS: 1.0000 | ORAL_TABLET | Freq: Three times a day (TID) | ORAL | 0 refills | Status: AC | PRN

## 2022-10-16 NOTE — Progress Notes (Signed)
Location:  [place] Room Number: [place] of Service:  ALF [phone]) Provider:  Windell Moulding, NP  Patient Care Team: [name], MD as PCP - General (Internal Medicine) [name], MD (Inactive) as Consulting Physician (Neurology)  Extended Emergency Contact Information Primary Emergency Contact: [place] Phone: [phone] Mobile Phone: [phone] Relation: Relative Secondary Emergency Contact: wright,Arora Mobile Phone: [phone] Relation: Daughter  Code Status:  DNR Goals of care: Advanced Directive information    10/16/2022   11:24 AM  Advanced Directives  Does Patient Have a Medical Advance Directive? Yes  Type of Paramedic of English;Out of facility DNR (pink MOST or yellow form)  Does patient want to make changes to medical advance directive? No - Patient declined  Copy of [place] in Chart? Yes - validated most recent copy scanned in chart (See row information)  Pre-existing out of facility DNR order (yellow form or pink MOST form) Yellow form placed in chart (order not valid for inpatient use)     Chief Complaint  Patient presents with   Medical Management of Chronic Issues    Routine Visit    HPI:  Pt is a 88 y.o. female seen today for medical management of chronic diseases.    She currently resides on the memory care unit at [facility] due to AD. PMH: HTN, COPD, epilepsy, neuropathy, pseudogout,OA, osteoporosis, open angle glaucoma.   Pelvic fracture- 11/10 mechanical fall, sent to ED for evaluation-CT left hip negative for fracture/multiple nondisplaced fractures to right and left superior pubic ramus, films reviewed by ortho- recommended WBAT and f/u in 2-3 weeks, ambulating with wheelchair, PT/OT ordered, remains on tylenol and norco prn Left hip pain- see above, left hip tender to touch, ambulating less due to pain  Alzheimer's  dementia- increased anxiety at night since fall/fracture per nursing, MMSE 18/30 03/2022, remains on Aricept and Namenda Epilepsy- followed by neurology, no recent seizures, remains on Lamictal Thyroid nodules- h/o thyroid nodules to both lobes, nodule 1.6 x 1.1 x 1 (right lobe), repeat U/S in 1 year- 11/2022, TSH 1.91/ T4 5.8 11/2021  HTN- BUN/creat 11.6/0.9 06/12/2022, off medication at this time, see pressures below Osteoporosis- DEXA 2021, t score -5.2, remains on Fosamax B12 deficiency- remains on cyanocobalamin Glaucoma- followed by ophthalmology, remains on brimonidine/bimatoprost/dorzolamide-timolol/pilocarpine  Recent blood pressures:  11/14- 120/70  11/13- 124/64, 118/69  Recent weights:  11/01- 127.6 lbs  10/01- 128.6 lbs  09/01- 126 lbs   Past Medical History:  Diagnosis Date   Arthritis    Per [place] new patient packet    Epilepsy ([place])    Per [place] new patient packet    Glaucoma    Hearing deficit    Hearing aids   High blood pressure    Per [place] new patient packet    History of abnormal weight loss    Low back pain    Memory difficulty 05/09/2016   MGUS (monoclonal gammopathy of unknown significance)    Neuropathy associated with MGUS ([place]) 11/09/2015   Radiculopathy of sacral and sacrococcygeal region    S1   Seizures (HCC)    Past Surgical History:  Procedure Laterality Date   APPENDECTOMY     BACK SURGERY     CATARACT EXTRACTION Left    OVARY SURGERY     PARATHYROIDECTOMY  9/   TRABECULECTOMY Left     Allergies  Allergen Reactions   Hctz [Hydrochlorothiazide]    Other Other (See  Comments)    Caused sodium to drop    Outpatient Encounter Medications as of 10/16/2022  Medication Sig   acetaminophen (TYLENOL) 325 MG tablet Take 650 mg by mouth every 8 (eight) hours as needed.   alendronate (FOSAMAX) 70 MG tablet Take 70 mg by mouth once a week. Take with a full glass of water on an empty stomach. Friday   bimatoprost (LUMIGAN) 0.01 % SOLN Place 1 drop  into both eyes at bedtime.   brimonidine (ALPHAGAN) 0.2 % ophthalmic solution Place 1 drop into the right eye 3 (three) times daily. Wait 10 minutes between drops. (Keep eyes closed for 2 minutes after each drop)   chlorhexidine (PERIDEX) 0.12 % solution Use as directed 5 mLs in the mouth or throat at bedtime. 1 tsp   Cholecalciferol (VITAMIN D3) 50 MCG (2000 UT) TABS Take by mouth.   cyanocobalamin 1000 MCG tablet Take 1,000 mcg by mouth in the morning.   diclofenac Sodium (VOLTAREN) 1 % GEL Apply 4 g topically as needed (Apply to left hip prn for pain or legft thumb pain).   donepezil (ARICEPT) 10 MG tablet Take 10 mg by mouth at bedtime.   dorzolamide-timolol (COSOPT) 22.3-6.8 MG/ML ophthalmic solution Place 1 drop into the right eye 2 (two) times daily. Wait 10 minutes between drops. (Keep eyes closed for 2 minutes after each drop)   Eyelid Cleansers (OCUSOFT EYELID CLEANSING) PADS Apply topically. Massage LEFT eyelid/lash line for 3 mins BID As Needed   HYDROcodone-acetaminophen (NORCO/VICODIN) 5-325 MG tablet Take 1 tablet by mouth every 6 (six) hours as needed for severe pain.   lamoTRIgine (LAMICTAL) 100 MG tablet Take 1 tablet in morning, 1 and 1/2 tablets at night   lamoTRIgine (LAMICTAL) 100 MG tablet Take 100 mg by mouth daily. Daily in the morning.   memantine (NAMENDA) 5 MG tablet Take 10 mg by mouth 2 (two) times daily.   Multiple Vitamin (MULTIVITAMIN) tablet Take 1 tablet by mouth daily.   pilocarpine (PILOCAR) 4 % ophthalmic solution Place 1 drop into the right eye 4 (four) times daily. Wait 10 minutes between drops. (Keep eyes closed for 2 minutes after each drop)   Polyethyl Glycol-Propyl Glycol 0.4-0.3 % SOLN Place 1 drop into both eyes as needed. Wait 10 minutes between drops. (Keep eyes closed for 2 minutes after each drop)   [DISCONTINUED] bimatoprost (LUMIGAN) 0.03 % ophthalmic solution Place 1 drop into both eyes at bedtime.   [DISCONTINUED] Netarsudil Dimesylate  (RHOPRESSA) 0.02 % SOLN Apply 1 drop to eye every evening. Apply 1 drop to right eye every evening. Wait 5 mins between drops.   No facility-administered encounter medications on file as of 10/16/2022.    Review of Systems  Unable to perform ROS: Dementia    Immunization History  Administered Date(s) Administered   Covid-19, Mrna,Vaccine(Spikevax)65yr and older 10/08/2022   Influenza, High Dose Seasonal PF 09/26/2015, 09/03/2017, 08/28/2018, 09/23/2020, 09/07/2022   Influenza,inj,Quad PF,6+ Mos 10/01/2019   Influenza-Unspecified 09/22/2009, 08/27/2011, 10/08/2012, 12/08/2014, 02/10/2018, 09/06/2021   Moderna Covid-19 Vaccine Bivalent Booster 177yr& up 04/19/2022   Moderna Sars-Covid-2 Vaccination 12/14/2019, 01/12/2020, 09/20/2020, 10/13/2020   Pneumococcal Conjugate-13 12/31/2013   Pneumococcal Polysaccharide-23 12/04/1999, 02/02/2016   Td 12/04/2000   Tdap 11/07/2012   Zoster Recombinat (Shingrix) 04/01/2022, 06/01/2022   Pertinent  Health Maintenance Due  Topic Date Due   INFLUENZA VACCINE  Completed   DEXA SCAN  Completed      02/10/2021   11:01 AM 05/03/2021   10:14 AM 02/07/2022  11:26 AM 03/30/2022    9:35 AM 10/12/2022    3:54 AM  Fall Risk  Falls in the past year? 0 0 0 0   Was there an injury with Fall? 0  0 0   Fall Risk Category Calculator 0  0 0   Fall Risk Category Low  Low Low   Patient Fall Risk Level Low fall risk  Low fall risk Low fall risk High fall risk  Patient at Risk for Falls Due to   No Fall Risks    Fall risk Follow up   Falls evaluation completed     Functional Status Survey:    Vitals:   10/16/22 1110  BP: 120/70  Pulse: 94  Resp: 20  Temp: 98 F (36.7 C)  SpO2: 95%  Weight: 127 lb (57.6 kg)  Height: '5\' 5"'$  (1.651 m)   Body mass index is 21.13 kg/m. Physical Exam Vitals reviewed.  Constitutional:      General: She is not in acute distress. HENT:     Head: Normocephalic.  Eyes:     General:        Right eye: No discharge.         Left eye: No discharge.  Cardiovascular:     Rate and Rhythm: Normal rate and regular rhythm.     Pulses: Normal pulses.     Heart sounds: Normal heart sounds.  Pulmonary:     Effort: Pulmonary effort is normal. No respiratory distress.     Breath sounds: Normal breath sounds. No wheezing.  Abdominal:     General: Bowel sounds are normal. There is no distension.     Palpations: Abdomen is soft.     Tenderness: There is no abdominal tenderness.  Musculoskeletal:     Cervical back: Neck supple.     Left hip: Tenderness present. No deformity or crepitus. Decreased range of motion. Decreased strength.     Right lower leg: No edema.     Left lower leg: No edema.     Comments: Dorsiflexion LLE 4/5  Skin:    General: Skin is warm and dry.     Capillary Refill: Capillary refill takes less than 2 seconds.  Neurological:     General: No focal deficit present.     Mental Status: She is alert. Mental status is at baseline.     Motor: Weakness present.     Gait: Gait abnormal.     Comments: Unstable gait  Psychiatric:        Mood and Affect: Mood normal.        Behavior: Behavior normal.     Comments: Very pleasant, follows commands, alert to self     Labs reviewed: Recent Labs    11/07/21 0000 06/12/22 0000 10/12/22 0923  NA 140 142 140  K 3.6 4.1 3.7  CL 104 105 107  CO2 21 24* 25  GLUCOSE  --   --  111*  BUN '15 13 13  '$ CREATININE 0.9 0.1* 0.81  CALCIUM 8.5* 8.9 8.5*   Recent Labs    11/07/21 0000  AST 24  ALT 15  ALKPHOS 97  ALBUMIN 3.9   Recent Labs    11/07/21 0000 06/12/22 0000 10/12/22 0923  WBC 4.7 5.4 9.5  HGB 12.1 12.8 12.0  HCT 35* 34* 36.2  MCV  --   --  96.0  PLT 138* 171 142*   Lab Results  Component Value Date   TSH 1.91 11/21/2021   No results found  for: "HGBA1C" Lab Results  Component Value Date   CHOL 241 (A) 09/10/2019   HDL 99 (A) 09/10/2019   [place] 128 09/10/2019   TRIG 72 09/10/2019    Significant Diagnostic Results in  last 30 days:  CT Hip Left Wo Contrast  Result Date: 10/12/2022 CLINICAL DATA:  88 year old female status post fall with left hip pain, negative plain radiographs. EXAM: CT OF THE LEFT HIP WITHOUT CONTRAST TECHNIQUE: Multidetector CT imaging of the left hip was performed according to the standard protocol. Multiplanar CT image reconstructions were also generated. RADIATION DOSE REDUCTION: This exam was performed according to the departmental dose-optimization program which includes automated exposure control, adjustment of the mA and/or kV according to patient size and/or use of iterative reconstruction technique. COMPARISON:  Left hip radiographs 0406 hours this morning. CT Abdomen and Pelvis 11/10/2007. FINDINGS: Partially visible simple fluid density right adnexal cyst, with evidence this was present in 2008 (no follow-up imaging recommended). No pelvis free fluid. Negative visible bowel loops aside from retained stool. Calcified atherosclerosis. No left inguinal or pelvic sidewall lymphadenopathy. Mild subcutaneous fat stranding/contusion lateral to the proximal left femur on series 3, image 63. No measurable hematoma. No soft tissue gas. Osteopenia. There is a nondisplaced left inferior pubic ramus fracture on series 2, image 46 which appears acute. The remaining visible left hemipelvis including the left acetabulum and superior pubic ramus appear intact. But there is also a nondisplaced fracture of the contralateral right pubic ramus 1 cm from the symphysis (series 4, image 21). Pubic symphysis remains aligned without evidence of diastasis. Left femoral head is normally located and appears stable, intact. No proximal left femur fracture identified. IMPRESSION: 1. Osteopenia with nondisplaced fractures of the right superior pubic ramus near the symphysis, and the left inferior pubic ramus. No symphysis diastasis. 2. No proximal left femur fracture identified by CT. Mild subcutaneous contusion lateral to the  femur. Electronically Signed   By: Genevie Ann M.D.   On: 10/12/2022 07:15   DG Chest Portable 1 View  Result Date: 10/12/2022 CLINICAL DATA:  88 year old female status post fall with pain. EXAM: PORTABLE CHEST 1 VIEW COMPARISON:  Chest radiographs 08/16/2010. FINDINGS: Portable AP semi upright view at 0547 hours. Lower lung volumes. Cardiac size remains within normal limits. Evidence of increased tortuosity of the thoracic aorta since 20/11. Other mediastinal contours are within normal limits. Visualized tracheal air column is within normal limits. Allowing for portable technique the lungs are clear. No pneumothorax or pleural effusion. Chronic left chest wall/breast surgical clips were present in 2011. Right lower neck surgical clips are new. No acute osseous abnormality identified. Paucity of bowel gas in the upper abdomen. IMPRESSION: No acute cardiopulmonary abnormality or acute traumatic injury identified. Electronically Signed   By: Genevie Ann M.D.   On: 10/12/2022 06:08   DG Wrist Complete Left  Result Date: 10/12/2022 CLINICAL DATA:  88 year old female status post fall with pain. EXAM: LEFT WRIST - COMPLETE 3+ VIEW COMPARISON:  Left thumb series 0411 hours today. Previous left wrist series 05/08/2018. FINDINGS: Left thumb appears stable to that described earlier today. Mild distal left radius impaction demonstrated in 2019 appears healed. Bone mineralization is within normal limits for age. Carpal bone alignment is maintained. There is chronic chondrocalcinosis at the left wrist. No acute fracture or dislocation identified. IMPRESSION: No acute fracture or dislocation identified about the left wrist. Electronically Signed   By: Genevie Ann M.D.   On: 10/12/2022 06:06   CT Cervical Spine Wo  Contrast  Result Date: 10/12/2022 CLINICAL DATA:  88 year old female status post fall at home this morning. Pain. EXAM: CT CERVICAL SPINE WITHOUT CONTRAST TECHNIQUE: Multidetector CT imaging of the cervical spine was  performed without intravenous contrast. Multiplanar CT image reconstructions were also generated. RADIATION DOSE REDUCTION: This exam was performed according to the departmental dose-optimization program which includes automated exposure control, adjustment of the mA and/or kV according to patient size and/or use of iterative reconstruction technique. COMPARISON:  Head CT today.  Cervical spine radiographs 03/19/2011. FINDINGS: Alignment: Chronic straightening and mild reversal of cervical lordosis appears stable since 2012. Cervicothoracic junction alignment is within normal limits. Bilateral posterior element alignment is within normal limits. Skull base and vertebrae: Bone mineralization is within normal limits for age. C1 and C2 appear intact and aligned. No acute osseous abnormality identified. Soft tissues and spinal canal: No prevertebral fluid or swelling. No visible canal hematoma. Negative visible noncontrast neck soft tissues aside from carotid calcified atherosclerosis. Disc levels: Chronic cervical disc and endplate degeneration, advanced but stable since 2012 C4-C5 through C6-C7. Up to mild associated cervical spinal stenosis. Upper chest: Visible upper thoracic levels appear intact. Calcified aortic atherosclerosis. Partially visible posterior right upper lobe indistinct pulmonary opacity on series 4, image 105. No superior mediastinal lymphadenopathy. IMPRESSION: 1. No acute traumatic injury identified in the cervical spine. 2. Partially visible right upper lobe lung opacity, nonspecific. Recommend follow-up chest radiographs. 3. Chronic cervical disc and endplate degeneration with up to mild associated cervical spinal stenosis. 4.  Aortic Atherosclerosis (ICD10-I70.0). Electronically Signed   By: Genevie Ann M.D.   On: 10/12/2022 05:09   CT HEAD WO CONTRAST (5MM)  Result Date: 10/12/2022 CLINICAL DATA:  88 year old female status post fall at home this morning. Pain. EXAM: CT HEAD WITHOUT CONTRAST  TECHNIQUE: Contiguous axial images were obtained from the base of the skull through the vertex without intravenous contrast. RADIATION DOSE REDUCTION: This exam was performed according to the departmental dose-optimization program which includes automated exposure control, adjustment of the mA and/or kV according to patient size and/or use of iterative reconstruction technique. COMPARISON:  Brain MRI 08/08/2018.  Head CT 02/28/2011. FINDINGS: Brain: No midline shift, ventriculomegaly, mass effect, evidence of mass lesion, intracranial hemorrhage or evidence of cortically based acute infarction. Patchy and confluent bilateral cerebral white matter hypodensity has significantly progressed since 2012, but similar to MRI FLAIR signal abnormality in 2019. Some deep white matter capsule involvement, but the deep gray nuclei appear relatively spared. Elsewhere gray-white matter differentiation is within normal limits for age. Vascular: Mild Calcified atherosclerosis at the skull base. No suspicious intracranial vascular hyperdensity. Skull: Intact, no fracture identified. Sinuses/Orbits: Visualized paranasal sinuses and mastoids are stable and well aerated. Other: Postoperative changes to both globes. No orbit or scalp soft tissue injury identified. IMPRESSION: 1. No acute intracranial abnormality or acute traumatic injury identified. 2. Chronic cerebral white matter disease. Electronically Signed   By: Genevie Ann M.D.   On: 10/12/2022 05:06   DG Finger Thumb Left  Result Date: 10/12/2022 CLINICAL DATA:  88 year old female status post fall at home this morning. Pain. EXAM: LEFT THUMB 2+V COMPARISON:  Left wrist series 05/08/2018. FINDINGS: Left thumb MCP and IP joint space loss with subchondral sclerosis and bulky osteophytosis. Degenerative appearing subluxation at the MCP joint is chronic, was present in 2019. No acute fracture or dislocation identified. IMPRESSION: Osteoarthritis. No acute fracture or dislocation  identified about the left thumb. Electronically Signed   By: Herminio Heads.D.  On: 10/12/2022 04:30   DG Hip Unilat W or Wo Pelvis 2-3 Views Left  Result Date: 10/12/2022 CLINICAL DATA:  88 year old female status post fall at home this morning. Pain. EXAM: DG HIP (WITH OR WITHOUT PELVIS) 2-3V LEFT COMPARISON:  CT Abdomen and Pelvis 11/10/2007. FINDINGS: Femoral heads remain normally located. Hip joint spaces appear symmetric and normal for age. Grossly intact proximal right femur. No pelvis fracture identified. Partially visible multilevel advanced lumbar disc and endplate degeneration. Nonobstructed bowel gas pattern in the lower abdomen and pelvis. AP and cross-table lateral views of the left hip. The proximal left femur appears intact. Probable benign bone island left iliac wing on these images. IMPRESSION: No acute fracture or dislocation identified about the left hip or pelvis. If occult hip fracture is suspected or if the patient is unable to weightbear, MRI is the preferred modality for further evaluation. Electronically Signed   By: Genevie Ann M.D.   On: 10/12/2022 04:29    Assessment/Plan 1. Multiple closed fractures of pelvis without disruption of pelvic ring, initial encounter ([place]) - 11/10 mechanical fall  - CT left hip negative for fracture/multiple nondisplaced fractures to right and left superior pubic ramus - cont WBAT - cont PT/OT - will schedule tylenol for better pain control - will change norco to q8 hrs prn x 14 days - f/u with Dr. Ninfa Linden 11/30 - HYDROcodone-acetaminophen (NORCO/VICODIN) 5-325 MG tablet; Take 1 tablet by mouth every 8 (eight) hours as needed for up to 14 days for severe pain.  Dispense: 30 tablet; Refill: 0  2. Left hip pain - see above - HYDROcodone-acetaminophen (NORCO/VICODIN) 5-325 MG tablet; Take 1 tablet by mouth every 8 (eight) hours as needed for up to 14 days for severe pain.  Dispense: 30 tablet; Refill: 0  3. Mixed Alzheimer's and vascular dementia  ([place]) - MMSE 18/30 - increased anxiety at night since fall - will try hydroxyzine prn  - hydrOXYzine (VISTARIL) 25 MG capsule; Take 1 capsule (25 mg total) by mouth at bedtime as needed for up to 14 days.  Dispense: 14 capsule; Refill: 0  4. Anxiety - see above - hydrOXYzine (VISTARIL) 25 MG capsule; Take 1 capsule (25 mg total) by mouth at bedtime as needed for up to 14 days.  Dispense: 14 capsule; Refill: 0  5. Generalized convulsive epilepsy without intractable epilepsy ([place]) - no recent seizures - cont Lamictal  6. Multiple thyroid nodules - involves both lobes - repeat U/S 11/2022- will wait till ambulation improves  7. Essential hypertension - controlled without medication  8. Senile osteoporosis - DEXA 2021, t score -5.2 - cont Fosamax  9. Vitamin B 12 deficiency - cont cyanocobalamin  10. Primary open angle glaucoma (POAG) of both eyes, moderate stage - cont brimonidine/bimatoprost/dorzolamide-timolol/pilocarpine     Family/ staff Communication: plan discussed with patient and nurse Labs/tests ordered:  none

## 2022-10-17 DIAGNOSIS — S3282XA Multiple fractures of pelvis without disruption of pelvic ring, initial encounter for closed fracture: Secondary | ICD-10-CM | POA: Diagnosis not present

## 2022-10-17 DIAGNOSIS — R2681 Unsteadiness on feet: Secondary | ICD-10-CM | POA: Diagnosis not present

## 2022-10-17 DIAGNOSIS — S3282XS Multiple fractures of pelvis without disruption of pelvic ring, sequela: Secondary | ICD-10-CM | POA: Diagnosis not present

## 2022-10-17 DIAGNOSIS — R278 Other lack of coordination: Secondary | ICD-10-CM | POA: Diagnosis not present

## 2022-10-17 DIAGNOSIS — M6389 Disorders of muscle in diseases classified elsewhere, multiple sites: Secondary | ICD-10-CM | POA: Diagnosis not present

## 2022-10-17 DIAGNOSIS — M25552 Pain in left hip: Secondary | ICD-10-CM | POA: Diagnosis not present

## 2022-10-17 DIAGNOSIS — R296 Repeated falls: Secondary | ICD-10-CM | POA: Diagnosis not present

## 2022-10-17 DIAGNOSIS — F01B0 Vascular dementia, moderate, without behavioral disturbance, psychotic disturbance, mood disturbance, and anxiety: Secondary | ICD-10-CM | POA: Diagnosis not present

## 2022-10-17 DIAGNOSIS — R2689 Other abnormalities of gait and mobility: Secondary | ICD-10-CM | POA: Diagnosis not present

## 2022-10-18 DIAGNOSIS — R2681 Unsteadiness on feet: Secondary | ICD-10-CM | POA: Diagnosis not present

## 2022-10-18 DIAGNOSIS — R296 Repeated falls: Secondary | ICD-10-CM | POA: Diagnosis not present

## 2022-10-18 DIAGNOSIS — R278 Other lack of coordination: Secondary | ICD-10-CM | POA: Diagnosis not present

## 2022-10-18 DIAGNOSIS — M25552 Pain in left hip: Secondary | ICD-10-CM | POA: Diagnosis not present

## 2022-10-18 DIAGNOSIS — F01B0 Vascular dementia, moderate, without behavioral disturbance, psychotic disturbance, mood disturbance, and anxiety: Secondary | ICD-10-CM | POA: Diagnosis not present

## 2022-10-18 DIAGNOSIS — S3282XA Multiple fractures of pelvis without disruption of pelvic ring, initial encounter for closed fracture: Secondary | ICD-10-CM | POA: Diagnosis not present

## 2022-10-18 DIAGNOSIS — S3282XS Multiple fractures of pelvis without disruption of pelvic ring, sequela: Secondary | ICD-10-CM | POA: Diagnosis not present

## 2022-10-18 DIAGNOSIS — R2689 Other abnormalities of gait and mobility: Secondary | ICD-10-CM | POA: Diagnosis not present

## 2022-10-19 DIAGNOSIS — R2681 Unsteadiness on feet: Secondary | ICD-10-CM | POA: Diagnosis not present

## 2022-10-19 DIAGNOSIS — R278 Other lack of coordination: Secondary | ICD-10-CM | POA: Diagnosis not present

## 2022-10-19 DIAGNOSIS — F01B0 Vascular dementia, moderate, without behavioral disturbance, psychotic disturbance, mood disturbance, and anxiety: Secondary | ICD-10-CM | POA: Diagnosis not present

## 2022-10-19 DIAGNOSIS — M25552 Pain in left hip: Secondary | ICD-10-CM | POA: Diagnosis not present

## 2022-10-19 DIAGNOSIS — S3282XS Multiple fractures of pelvis without disruption of pelvic ring, sequela: Secondary | ICD-10-CM | POA: Diagnosis not present

## 2022-10-19 DIAGNOSIS — S3282XA Multiple fractures of pelvis without disruption of pelvic ring, initial encounter for closed fracture: Secondary | ICD-10-CM | POA: Diagnosis not present

## 2022-10-19 DIAGNOSIS — R2689 Other abnormalities of gait and mobility: Secondary | ICD-10-CM | POA: Diagnosis not present

## 2022-10-19 DIAGNOSIS — R296 Repeated falls: Secondary | ICD-10-CM | POA: Diagnosis not present

## 2022-10-22 DIAGNOSIS — R278 Other lack of coordination: Secondary | ICD-10-CM | POA: Diagnosis not present

## 2022-10-22 DIAGNOSIS — F01B0 Vascular dementia, moderate, without behavioral disturbance, psychotic disturbance, mood disturbance, and anxiety: Secondary | ICD-10-CM | POA: Diagnosis not present

## 2022-10-22 DIAGNOSIS — M6389 Disorders of muscle in diseases classified elsewhere, multiple sites: Secondary | ICD-10-CM | POA: Diagnosis not present

## 2022-10-22 DIAGNOSIS — M25552 Pain in left hip: Secondary | ICD-10-CM | POA: Diagnosis not present

## 2022-10-22 DIAGNOSIS — S3282XA Multiple fractures of pelvis without disruption of pelvic ring, initial encounter for closed fracture: Secondary | ICD-10-CM | POA: Diagnosis not present

## 2022-10-22 DIAGNOSIS — R296 Repeated falls: Secondary | ICD-10-CM | POA: Diagnosis not present

## 2022-10-23 DIAGNOSIS — M6389 Disorders of muscle in diseases classified elsewhere, multiple sites: Secondary | ICD-10-CM | POA: Diagnosis not present

## 2022-10-23 DIAGNOSIS — R2681 Unsteadiness on feet: Secondary | ICD-10-CM | POA: Diagnosis not present

## 2022-10-23 DIAGNOSIS — R278 Other lack of coordination: Secondary | ICD-10-CM | POA: Diagnosis not present

## 2022-10-23 DIAGNOSIS — M25552 Pain in left hip: Secondary | ICD-10-CM | POA: Diagnosis not present

## 2022-10-23 DIAGNOSIS — R2689 Other abnormalities of gait and mobility: Secondary | ICD-10-CM | POA: Diagnosis not present

## 2022-10-23 DIAGNOSIS — S3282XA Multiple fractures of pelvis without disruption of pelvic ring, initial encounter for closed fracture: Secondary | ICD-10-CM | POA: Diagnosis not present

## 2022-10-23 DIAGNOSIS — S3282XS Multiple fractures of pelvis without disruption of pelvic ring, sequela: Secondary | ICD-10-CM | POA: Diagnosis not present

## 2022-10-23 DIAGNOSIS — R296 Repeated falls: Secondary | ICD-10-CM | POA: Diagnosis not present

## 2022-10-23 DIAGNOSIS — F01B0 Vascular dementia, moderate, without behavioral disturbance, psychotic disturbance, mood disturbance, and anxiety: Secondary | ICD-10-CM | POA: Diagnosis not present

## 2022-10-24 DIAGNOSIS — F01B0 Vascular dementia, moderate, without behavioral disturbance, psychotic disturbance, mood disturbance, and anxiety: Secondary | ICD-10-CM | POA: Diagnosis not present

## 2022-10-24 DIAGNOSIS — M6389 Disorders of muscle in diseases classified elsewhere, multiple sites: Secondary | ICD-10-CM | POA: Diagnosis not present

## 2022-10-24 DIAGNOSIS — R296 Repeated falls: Secondary | ICD-10-CM | POA: Diagnosis not present

## 2022-10-24 DIAGNOSIS — M25552 Pain in left hip: Secondary | ICD-10-CM | POA: Diagnosis not present

## 2022-10-24 DIAGNOSIS — S3282XA Multiple fractures of pelvis without disruption of pelvic ring, initial encounter for closed fracture: Secondary | ICD-10-CM | POA: Diagnosis not present

## 2022-10-24 DIAGNOSIS — R278 Other lack of coordination: Secondary | ICD-10-CM | POA: Diagnosis not present

## 2022-10-24 DIAGNOSIS — R2689 Other abnormalities of gait and mobility: Secondary | ICD-10-CM | POA: Diagnosis not present

## 2022-10-24 DIAGNOSIS — S3282XS Multiple fractures of pelvis without disruption of pelvic ring, sequela: Secondary | ICD-10-CM | POA: Diagnosis not present

## 2022-10-24 DIAGNOSIS — R2681 Unsteadiness on feet: Secondary | ICD-10-CM | POA: Diagnosis not present

## 2022-10-26 DIAGNOSIS — R296 Repeated falls: Secondary | ICD-10-CM | POA: Diagnosis not present

## 2022-10-26 DIAGNOSIS — R278 Other lack of coordination: Secondary | ICD-10-CM | POA: Diagnosis not present

## 2022-10-26 DIAGNOSIS — R2681 Unsteadiness on feet: Secondary | ICD-10-CM | POA: Diagnosis not present

## 2022-10-26 DIAGNOSIS — R2689 Other abnormalities of gait and mobility: Secondary | ICD-10-CM | POA: Diagnosis not present

## 2022-10-26 DIAGNOSIS — S3282XS Multiple fractures of pelvis without disruption of pelvic ring, sequela: Secondary | ICD-10-CM | POA: Diagnosis not present

## 2022-10-29 DIAGNOSIS — M6389 Disorders of muscle in diseases classified elsewhere, multiple sites: Secondary | ICD-10-CM | POA: Diagnosis not present

## 2022-10-29 DIAGNOSIS — F01B0 Vascular dementia, moderate, without behavioral disturbance, psychotic disturbance, mood disturbance, and anxiety: Secondary | ICD-10-CM | POA: Diagnosis not present

## 2022-10-29 DIAGNOSIS — R296 Repeated falls: Secondary | ICD-10-CM | POA: Diagnosis not present

## 2022-10-29 DIAGNOSIS — S3282XS Multiple fractures of pelvis without disruption of pelvic ring, sequela: Secondary | ICD-10-CM | POA: Diagnosis not present

## 2022-10-29 DIAGNOSIS — R2689 Other abnormalities of gait and mobility: Secondary | ICD-10-CM | POA: Diagnosis not present

## 2022-10-29 DIAGNOSIS — R2681 Unsteadiness on feet: Secondary | ICD-10-CM | POA: Diagnosis not present

## 2022-10-29 DIAGNOSIS — S3282XA Multiple fractures of pelvis without disruption of pelvic ring, initial encounter for closed fracture: Secondary | ICD-10-CM | POA: Diagnosis not present

## 2022-10-29 DIAGNOSIS — M25552 Pain in left hip: Secondary | ICD-10-CM | POA: Diagnosis not present

## 2022-10-29 DIAGNOSIS — R278 Other lack of coordination: Secondary | ICD-10-CM | POA: Diagnosis not present

## 2022-10-30 DIAGNOSIS — R278 Other lack of coordination: Secondary | ICD-10-CM | POA: Diagnosis not present

## 2022-10-30 DIAGNOSIS — M25552 Pain in left hip: Secondary | ICD-10-CM | POA: Diagnosis not present

## 2022-10-30 DIAGNOSIS — R2681 Unsteadiness on feet: Secondary | ICD-10-CM | POA: Diagnosis not present

## 2022-10-30 DIAGNOSIS — R2689 Other abnormalities of gait and mobility: Secondary | ICD-10-CM | POA: Diagnosis not present

## 2022-10-30 DIAGNOSIS — S3282XA Multiple fractures of pelvis without disruption of pelvic ring, initial encounter for closed fracture: Secondary | ICD-10-CM | POA: Diagnosis not present

## 2022-10-30 DIAGNOSIS — S3282XS Multiple fractures of pelvis without disruption of pelvic ring, sequela: Secondary | ICD-10-CM | POA: Diagnosis not present

## 2022-10-30 DIAGNOSIS — R296 Repeated falls: Secondary | ICD-10-CM | POA: Diagnosis not present

## 2022-10-30 DIAGNOSIS — F01B0 Vascular dementia, moderate, without behavioral disturbance, psychotic disturbance, mood disturbance, and anxiety: Secondary | ICD-10-CM | POA: Diagnosis not present

## 2022-10-31 DIAGNOSIS — R278 Other lack of coordination: Secondary | ICD-10-CM | POA: Diagnosis not present

## 2022-10-31 DIAGNOSIS — R296 Repeated falls: Secondary | ICD-10-CM | POA: Diagnosis not present

## 2022-10-31 DIAGNOSIS — R2681 Unsteadiness on feet: Secondary | ICD-10-CM | POA: Diagnosis not present

## 2022-10-31 DIAGNOSIS — R2689 Other abnormalities of gait and mobility: Secondary | ICD-10-CM | POA: Diagnosis not present

## 2022-10-31 DIAGNOSIS — S3282XS Multiple fractures of pelvis without disruption of pelvic ring, sequela: Secondary | ICD-10-CM | POA: Diagnosis not present

## 2022-11-01 ENCOUNTER — Encounter: Payer: Self-pay | Admitting: Orthopaedic Surgery

## 2022-11-01 ENCOUNTER — Ambulatory Visit (INDEPENDENT_AMBULATORY_CARE_PROVIDER_SITE_OTHER): Payer: Medicare Other

## 2022-11-01 ENCOUNTER — Ambulatory Visit (INDEPENDENT_AMBULATORY_CARE_PROVIDER_SITE_OTHER): Payer: Medicare Other | Admitting: Orthopaedic Surgery

## 2022-11-01 DIAGNOSIS — S3282XA Multiple fractures of pelvis without disruption of pelvic ring, initial encounter for closed fracture: Secondary | ICD-10-CM | POA: Diagnosis not present

## 2022-11-01 DIAGNOSIS — R296 Repeated falls: Secondary | ICD-10-CM | POA: Diagnosis not present

## 2022-11-01 DIAGNOSIS — F01B0 Vascular dementia, moderate, without behavioral disturbance, psychotic disturbance, mood disturbance, and anxiety: Secondary | ICD-10-CM | POA: Diagnosis not present

## 2022-11-01 DIAGNOSIS — S32592A Other specified fracture of left pubis, initial encounter for closed fracture: Secondary | ICD-10-CM

## 2022-11-01 DIAGNOSIS — R278 Other lack of coordination: Secondary | ICD-10-CM | POA: Diagnosis not present

## 2022-11-01 DIAGNOSIS — R2689 Other abnormalities of gait and mobility: Secondary | ICD-10-CM | POA: Diagnosis not present

## 2022-11-01 DIAGNOSIS — R2681 Unsteadiness on feet: Secondary | ICD-10-CM | POA: Diagnosis not present

## 2022-11-01 DIAGNOSIS — M25552 Pain in left hip: Secondary | ICD-10-CM

## 2022-11-01 DIAGNOSIS — S3282XS Multiple fractures of pelvis without disruption of pelvic ring, sequela: Secondary | ICD-10-CM | POA: Diagnosis not present

## 2022-11-01 NOTE — Progress Notes (Signed)
The patient is a 88 year old female with dementia who resides at a skilled nursing facility.  She had a fall about 3 weeks ago and was seen in the emergency room with x-rays showing no obvious fracture but a CT scan showing nondisplaced inferior and superior rami fractures on the left side.  She also had a contusion of her left wrist but no fracture.  She was brought from the facility today.  She is awake and alert but does have some dementia.  She follows commands easily and is very conversational.  She only reports left hip pain but denies left wrist pain.  Her left wrist move smoothly and fluidly with no blocks to rotation or flexion extension.  There is no bruising and no pain.  She has good grip and pinch strength.  And her x-rays previously of the left wrist were negative.  I can put her left hip through internal and external rotation with only some mild pain in the groin.  X-rays of the pelvis today show no obvious fracture.  I did review the CT scan which barely shows nondisplaced fractures of the inferior pubic rami.  Follow-up can be as needed for this type of injury.  It is already not visible on plain films even at 3 weeks.  I did note to the facility to only have the patient up ever with assistance given her fall risk.  All question concerns were answered and addressed.

## 2022-11-02 DIAGNOSIS — H401133 Primary open-angle glaucoma, bilateral, severe stage: Secondary | ICD-10-CM | POA: Diagnosis not present

## 2022-11-05 ENCOUNTER — Encounter: Payer: Self-pay | Admitting: Internal Medicine

## 2022-11-05 ENCOUNTER — Non-Acute Institutional Stay (SKILLED_NURSING_FACILITY): Payer: Medicare Other | Admitting: Internal Medicine

## 2022-11-05 DIAGNOSIS — M810 Age-related osteoporosis without current pathological fracture: Secondary | ICD-10-CM

## 2022-11-05 DIAGNOSIS — F0280 Dementia in other diseases classified elsewhere without behavioral disturbance: Secondary | ICD-10-CM

## 2022-11-05 DIAGNOSIS — R2681 Unsteadiness on feet: Secondary | ICD-10-CM | POA: Diagnosis not present

## 2022-11-05 DIAGNOSIS — R2689 Other abnormalities of gait and mobility: Secondary | ICD-10-CM | POA: Diagnosis not present

## 2022-11-05 DIAGNOSIS — S3282XA Multiple fractures of pelvis without disruption of pelvic ring, initial encounter for closed fracture: Secondary | ICD-10-CM | POA: Diagnosis not present

## 2022-11-05 DIAGNOSIS — I10 Essential (primary) hypertension: Secondary | ICD-10-CM | POA: Diagnosis not present

## 2022-11-05 DIAGNOSIS — R296 Repeated falls: Secondary | ICD-10-CM | POA: Diagnosis not present

## 2022-11-05 DIAGNOSIS — M25552 Pain in left hip: Secondary | ICD-10-CM | POA: Diagnosis not present

## 2022-11-05 DIAGNOSIS — S3282XS Multiple fractures of pelvis without disruption of pelvic ring, sequela: Secondary | ICD-10-CM | POA: Diagnosis not present

## 2022-11-05 DIAGNOSIS — M6389 Disorders of muscle in diseases classified elsewhere, multiple sites: Secondary | ICD-10-CM | POA: Diagnosis not present

## 2022-11-05 DIAGNOSIS — G40309 Generalized idiopathic epilepsy and epileptic syndromes, not intractable, without status epilepticus: Secondary | ICD-10-CM

## 2022-11-05 DIAGNOSIS — R278 Other lack of coordination: Secondary | ICD-10-CM | POA: Diagnosis not present

## 2022-11-05 DIAGNOSIS — F0150 Vascular dementia without behavioral disturbance: Secondary | ICD-10-CM

## 2022-11-05 DIAGNOSIS — F01B0 Vascular dementia, moderate, without behavioral disturbance, psychotic disturbance, mood disturbance, and anxiety: Secondary | ICD-10-CM | POA: Diagnosis not present

## 2022-11-05 DIAGNOSIS — G309 Alzheimer's disease, unspecified: Secondary | ICD-10-CM | POA: Diagnosis not present

## 2022-11-05 NOTE — Progress Notes (Signed)
Location:  [facility] Room Number: 673A Place of Service:  SNF [phone]) Provider:  [name], MD   [name], MD  Patient Care Team: [name], MD as PCP - General (Internal Medicine) [name], MD (Inactive) as Consulting Physician (Neurology)  Extended Emergency Contact Information Primary Emergency Contact: Bird-in[place]Hand Phone: [phone] Mobile Phone: [phone] Relation: Relative Secondary Emergency Contact: wright,Devetta Mobile Phone: [phone] Relation: Daughter  Code Status:  DNR Goals of care: Advanced Directive information    11/05/2022   10:43 AM  Advanced Directives  Does Patient Have a Medical Advance Directive? Yes  Type of Paramedic of [place];Out of facility DNR (pink MOST or yellow form)  Does patient want to make changes to medical advance directive? No - Patient declined  Copy of [place] in Chart? Yes - validated most recent copy scanned in chart (See row information)  Pre-existing out of facility DNR order (yellow form or pink MOST form) Yellow form placed in chart (order not valid for inpatient use)     Chief Complaint  Patient presents with   Medical Management of Chronic Issues    Patient is being seen for a routine visit    HPI:  Pt is a 88 y.o. female seen today for medical management of chronic diseases.    Lives in Memory unit in [place]   Has h/o Alzheimer Dementia, h/o Seizure disorder and Osteoarthritis and Hypertension H/o Peripheral Neuropathy  [place] on 11/10 CT showed nondisplaced fractures of the right superior pubic ramus near the symphysis, and the left inferior pubic   Seen by Dr Ninfa Linden with no new recommendations  Per therapy walking with her walker and Contact guard C/o Some pain in her left leg  No other  new Nursing issues. No Behavior issues Her weight is stable Wt Readings from Last 3 Encounters:   11/05/22 133 lb 6.4 oz (60.5 kg)  10/16/22 127 lb (57.6 kg)  10/12/22 127 lb 13.9 oz (58 kg)   Past Medical History:  Diagnosis Date   Arthritis    Per [facility] new patient packet    Epilepsy ([place])    Per [facility] new patient packet    Glaucoma    Hearing deficit    Hearing aids   High blood pressure    Per [facility] new patient packet    History of abnormal weight loss    Low back pain    Memory difficulty 05/09/2016   MGUS (monoclonal gammopathy of unknown significance)    Neuropathy associated with MGUS ([employer]) 11/09/2015   Radiculopathy of sacral and sacrococcygeal region    S1   Seizures ([place])    Past Surgical History:  Procedure Laterality Date   APPENDECTOMY     BACK SURGERY     CATARACT EXTRACTION Left    OVARY SURGERY     PARATHYROIDECTOMY  9/   TRABECULECTOMY Left     Allergies  Allergen Reactions   Hctz [Hydrochlorothiazide]    Other Other (See Comments)    Caused sodium to drop    Outpatient Encounter Medications as of 11/05/2022  Medication Sig   alendronate (FOSAMAX) 70 MG tablet Take 70 mg by mouth once a week. Take with a full glass of water on an empty stomach. Friday   bimatoprost (LUMIGAN) 0.01 % SOLN Place 1 drop into both eyes at bedtime.   brimonidine (ALPHAGAN) 0.2 % ophthalmic solution Place 1 drop into the right eye 3 (three)  times daily. Wait 10 minutes between drops. (Keep eyes closed for 2 minutes after each drop)   chlorhexidine (PERIDEX) 0.12 % solution Use as directed 5 mLs in the mouth or throat at bedtime. 1 tsp   Cholecalciferol (VITAMIN D3) 50 MCG (2000 UT) TABS Take by mouth.   cyanocobalamin 1000 MCG tablet Take 1,000 mcg by mouth in the morning.   diclofenac Sodium (VOLTAREN) 1 % GEL Apply 4 g topically as needed (Apply to left hip prn for pain or legft thumb pain).   donepezil (ARICEPT) 10 MG tablet Take 10 mg by mouth at bedtime.   dorzolamide-timolol (COSOPT) 22.3-6.8 MG/ML ophthalmic solution Place 1 drop into the right eye 2 (two) times daily.  Wait 10 minutes between drops. (Keep eyes closed for 2 minutes after each drop)   Eyelid Cleansers (OCUSOFT EYELID CLEANSING) PADS Apply topically. Massage LEFT eyelid/lash line for 3 mins BID As Needed   lamoTRIgine (LAMICTAL) 100 MG tablet Take 1 tablet in morning, 1 and 1/2 tablets at night   lamoTRIgine (LAMICTAL) 100 MG tablet Take 100 mg by mouth daily. Daily in the morning.   memantine (NAMENDA) 5 MG tablet Take 10 mg by mouth 2 (two) times daily.   Multiple Vitamin (MULTIVITAMIN) tablet Take 1 tablet by mouth daily.   oxyCODONE-acetaminophen (PERCOCET/ROXICET) 5-325 MG tablet Take 2 tablets by mouth every 8 (eight) hours as needed for severe pain.   pilocarpine (PILOCAR) 4 % ophthalmic solution Place 1 drop into the right eye 4 (four) times daily. Wait 10 minutes between drops. (Keep eyes closed for 2 minutes after each drop)   Polyethyl Glycol-Propyl Glycol 0.4-0.3 % SOLN Place 1 drop into both eyes as needed. Wait 10 minutes between drops. (Keep eyes closed for 2 minutes after each drop)   No facility-administered encounter medications on file as of 11/05/2022.   Ros Not obtained due to dementia   Immunization History  Administered Date(s) Administered   Covid-19, Mrna,Vaccine(Spikevax)37yr and older 10/08/2022   Influenza, High Dose Seasonal PF 09/26/2015, 09/03/2017, 08/28/2018, 09/23/2020, 09/07/2022   Influenza,inj,Quad PF,6+ Mos 10/01/2019   Influenza-Unspecified 09/22/2009, 08/27/2011, 10/08/2012, 12/08/2014, 02/10/2018, 09/06/2021   Moderna Covid-19 Vaccine Bivalent Booster 156yr& up 04/19/2022   Moderna Sars-Covid-2 Vaccination 12/14/2019, 01/12/2020, 09/20/2020, 10/13/2020, 10/08/2022   Pneumococcal Conjugate-13 12/31/2013   Pneumococcal Polysaccharide-23 12/04/1999, 02/02/2016   Td 12/04/2000   Tdap 11/07/2012   Zoster Recombinat (Shingrix) 04/01/2022, 06/01/2022   Pertinent  Health Maintenance Due  Topic Date Due   INFLUENZA VACCINE  Completed   DEXA SCAN   Completed      02/10/2021   11:01 AM 05/03/2021   10:14 AM 02/07/2022   11:26 AM 03/30/2022    9:35 AM 10/12/2022    3:54 AM  Fall Risk  Falls in the past year? 0 0 0 0   Was there an injury with Fall? 0  0 0   Fall Risk Category Calculator 0  0 0   Fall Risk Category Low  Low Low   Patient Fall Risk Level Low fall risk  Low fall risk Low fall risk High fall risk  Patient at Risk for Falls Due to   No Fall Risks    Fall risk Follow up   Falls evaluation completed     Functional Status Survey:    Vitals:   11/05/22 1029  BP: (!) 150/82  Pulse: 82  Resp: 20  Temp: 97.8 F (36.6 C)  TempSrc: Temporal  SpO2: 97%  Weight: 133 lb 6.4 oz (60.5  kg)  Height: '5\' 5"'$  (1.651 m)   Body mass index is 22.2 kg/m. Physical Exam Vitals reviewed.  Constitutional:      Appearance: Normal appearance.  HENT:     Head: Normocephalic.     Nose: Nose normal.     Mouth/Throat:     Mouth: Mucous membranes are moist.     Pharynx: Oropharynx is clear.  Eyes:     Pupils: Pupils are equal, round, and reactive to light.  Cardiovascular:     Rate and Rhythm: Normal rate and regular rhythm.     Pulses: Normal pulses.     Heart sounds: Normal heart sounds. No murmur heard. Pulmonary:     Effort: Pulmonary effort is normal.     Breath sounds: Normal breath sounds.  Abdominal:     General: Abdomen is flat. Bowel sounds are normal.     Palpations: Abdomen is soft.  Musculoskeletal:        General: Swelling present.     Cervical back: Neck supple.  Skin:    General: Skin is warm.  Neurological:     General: No focal deficit present.     Mental Status: She is alert.  Psychiatric:        Mood and Affect: Mood normal.        Thought Content: Thought content normal.     Labs reviewed: Recent Labs    11/07/21 0000 06/12/22 0000 10/12/22 0923  NA 140 142 140  K 3.6 4.1 3.7  CL 104 105 107  CO2 21 24* 25  GLUCOSE  --   --  111*  BUN '15 13 13  '$ CREATININE 0.9 0.1* 0.81  CALCIUM 8.5* 8.9  8.5*   Recent Labs    11/07/21 0000  AST 24  ALT 15  ALKPHOS 97  ALBUMIN 3.9   Recent Labs    11/07/21 0000 06/12/22 0000 10/12/22 0923  WBC 4.7 5.4 9.5  HGB 12.1 12.8 12.0  HCT 35* 34* 36.2  MCV  --   --  96.0  PLT 138* 171 142*   Lab Results  Component Value Date   TSH 1.91 11/21/2021   No results found for: "HGBA1C" Lab Results  Component Value Date   CHOL 241 (A) 09/10/2019   HDL 99 (A) 09/10/2019   LDLCALC 128 09/10/2019   TRIG 72 09/10/2019    Significant Diagnostic Results in last 30 days:  XR HIP UNILAT W OR W/O PELVIS 1V LEFT  Result Date: 11/01/2022 An AP pelvis shows normal hips bilaterally.  Nondisplaced rami fractures on the left side that were seen on CT scan 3 weeks ago are not visible on plain films today.  CT Hip Left Wo Contrast  Result Date: 10/12/2022 CLINICAL DATA:  88 year old female status post fall with left hip pain, negative plain radiographs. EXAM: CT OF THE LEFT HIP WITHOUT CONTRAST TECHNIQUE: Multidetector CT imaging of the left hip was performed according to the standard protocol. Multiplanar CT image reconstructions were also generated. RADIATION DOSE REDUCTION: This exam was performed according to the departmental dose-optimization program which includes automated exposure control, adjustment of the mA and/or kV according to patient size and/or use of iterative reconstruction technique. COMPARISON:  Left hip radiographs 0406 hours this morning. CT Abdomen and Pelvis 11/10/2007. FINDINGS: Partially visible simple fluid density right adnexal cyst, with evidence this was present in 2008 (no follow-up imaging recommended). No pelvis free fluid. Negative visible bowel loops aside from retained stool. Calcified atherosclerosis. No left inguinal or pelvic  sidewall lymphadenopathy. Mild subcutaneous fat stranding/contusion lateral to the proximal left femur on series 3, image 63. No measurable hematoma. No soft tissue gas. Osteopenia. There is a  nondisplaced left inferior pubic ramus fracture on series 2, image 46 which appears acute. The remaining visible left hemipelvis including the left acetabulum and superior pubic ramus appear intact. But there is also a nondisplaced fracture of the contralateral right pubic ramus 1 cm from the symphysis (series 4, image 21). Pubic symphysis remains aligned without evidence of diastasis. Left femoral head is normally located and appears stable, intact. No proximal left femur fracture identified. IMPRESSION: 1. Osteopenia with nondisplaced fractures of the right superior pubic ramus near the symphysis, and the left inferior pubic ramus. No symphysis diastasis. 2. No proximal left femur fracture identified by CT. Mild subcutaneous contusion lateral to the femur. Electronically Signed   By: Genevie Ann M.D.   On: 10/12/2022 07:15   DG Chest Portable 1 View  Result Date: 10/12/2022 CLINICAL DATA:  88 year old female status post fall with pain. EXAM: PORTABLE CHEST 1 VIEW COMPARISON:  Chest radiographs 08/16/2010. FINDINGS: Portable AP semi upright view at 0547 hours. Lower lung volumes. Cardiac size remains within normal limits. Evidence of increased tortuosity of the thoracic aorta since 20/11. Other mediastinal contours are within normal limits. Visualized tracheal air column is within normal limits. Allowing for portable technique the lungs are clear. No pneumothorax or pleural effusion. Chronic left chest wall/breast surgical clips were present in 2011. Right lower neck surgical clips are new. No acute osseous abnormality identified. Paucity of bowel gas in the upper abdomen. IMPRESSION: No acute cardiopulmonary abnormality or acute traumatic injury identified. Electronically Signed   By: Genevie Ann M.D.   On: 10/12/2022 06:08   DG Wrist Complete Left  Result Date: 10/12/2022 CLINICAL DATA:  88 year old female status post fall with pain. EXAM: LEFT WRIST - COMPLETE 3+ VIEW COMPARISON:  Left thumb series 0411 hours  today. Previous left wrist series 05/08/2018. FINDINGS: Left thumb appears stable to that described earlier today. Mild distal left radius impaction demonstrated in 2019 appears healed. Bone mineralization is within normal limits for age. Carpal bone alignment is maintained. There is chronic chondrocalcinosis at the left wrist. No acute fracture or dislocation identified. IMPRESSION: No acute fracture or dislocation identified about the left wrist. Electronically Signed   By: Genevie Ann M.D.   On: 10/12/2022 06:06   CT Cervical Spine Wo Contrast  Result Date: 10/12/2022 CLINICAL DATA:  88 year old female status post fall at home this morning. Pain. EXAM: CT CERVICAL SPINE WITHOUT CONTRAST TECHNIQUE: Multidetector CT imaging of the cervical spine was performed without intravenous contrast. Multiplanar CT image reconstructions were also generated. RADIATION DOSE REDUCTION: This exam was performed according to the departmental dose-optimization program which includes automated exposure control, adjustment of the mA and/or kV according to patient size and/or use of iterative reconstruction technique. COMPARISON:  Head CT today.  Cervical spine radiographs 03/19/2011. FINDINGS: Alignment: Chronic straightening and mild reversal of cervical lordosis appears stable since 2012. Cervicothoracic junction alignment is within normal limits. Bilateral posterior element alignment is within normal limits. Skull base and vertebrae: Bone mineralization is within normal limits for age. C1 and C2 appear intact and aligned. No acute osseous abnormality identified. Soft tissues and spinal canal: No prevertebral fluid or swelling. No visible canal hematoma. Negative visible noncontrast neck soft tissues aside from carotid calcified atherosclerosis. Disc levels: Chronic cervical disc and endplate degeneration, advanced but stable since 2012 C4-C5 through C6-C7.  Up to mild associated cervical spinal stenosis. Upper chest: Visible upper  thoracic levels appear intact. Calcified aortic atherosclerosis. Partially visible posterior right upper lobe indistinct pulmonary opacity on series 4, image 105. No superior mediastinal lymphadenopathy. IMPRESSION: 1. No acute traumatic injury identified in the cervical spine. 2. Partially visible right upper lobe lung opacity, nonspecific. Recommend follow-up chest radiographs. 3. Chronic cervical disc and endplate degeneration with up to mild associated cervical spinal stenosis. 4.  Aortic Atherosclerosis (ICD10-I70.0). Electronically Signed   By: Genevie Ann M.D.   On: 10/12/2022 05:09   CT HEAD WO CONTRAST (5MM)  Result Date: 10/12/2022 CLINICAL DATA:  88 year old female status post fall at home this morning. Pain. EXAM: CT HEAD WITHOUT CONTRAST TECHNIQUE: Contiguous axial images were obtained from the base of the skull through the vertex without intravenous contrast. RADIATION DOSE REDUCTION: This exam was performed according to the departmental dose-optimization program which includes automated exposure control, adjustment of the mA and/or kV according to patient size and/or use of iterative reconstruction technique. COMPARISON:  Brain MRI 08/08/2018.  Head CT 02/28/2011. FINDINGS: Brain: No midline shift, ventriculomegaly, mass effect, evidence of mass lesion, intracranial hemorrhage or evidence of cortically based acute infarction. Patchy and confluent bilateral cerebral white matter hypodensity has significantly progressed since 2012, but similar to MRI FLAIR signal abnormality in 2019. Some deep white matter capsule involvement, but the deep gray nuclei appear relatively spared. Elsewhere gray-white matter differentiation is within normal limits for age. Vascular: Mild Calcified atherosclerosis at the skull base. No suspicious intracranial vascular hyperdensity. Skull: Intact, no fracture identified. Sinuses/Orbits: Visualized paranasal sinuses and mastoids are stable and well aerated. Other:  Postoperative changes to both globes. No orbit or scalp soft tissue injury identified. IMPRESSION: 1. No acute intracranial abnormality or acute traumatic injury identified. 2. Chronic cerebral white matter disease. Electronically Signed   By: Genevie Ann M.D.   On: 10/12/2022 05:06   DG Finger Thumb Left  Result Date: 10/12/2022 CLINICAL DATA:  88 year old female status post fall at home this morning. Pain. EXAM: LEFT THUMB 2+V COMPARISON:  Left wrist series 05/08/2018. FINDINGS: Left thumb MCP and IP joint space loss with subchondral sclerosis and bulky osteophytosis. Degenerative appearing subluxation at the MCP joint is chronic, was present in 2019. No acute fracture or dislocation identified. IMPRESSION: Osteoarthritis. No acute fracture or dislocation identified about the left thumb. Electronically Signed   By: Genevie Ann M.D.   On: 10/12/2022 04:30   DG Hip Unilat W or Wo Pelvis 2-3 Views Left  Result Date: 10/12/2022 CLINICAL DATA:  88 year old female status post fall at home this morning. Pain. EXAM: DG HIP (WITH OR WITHOUT PELVIS) 2-3V LEFT COMPARISON:  CT Abdomen and Pelvis 11/10/2007. FINDINGS: Femoral heads remain normally located. Hip joint spaces appear symmetric and normal for age. Grossly intact proximal right femur. No pelvis fracture identified. Partially visible multilevel advanced lumbar disc and endplate degeneration. Nonobstructed bowel gas pattern in the lower abdomen and pelvis. AP and cross-table lateral views of the left hip. The proximal left femur appears intact. Probable benign bone island left iliac wing on these images. IMPRESSION: No acute fracture or dislocation identified about the left hip or pelvis. If occult hip fracture is suspected or if the patient is unable to weightbear, MRI is the preferred modality for further evaluation. Electronically Signed   By: Genevie Ann M.D.   On: 10/12/2022 04:29    Assessment/Plan 1. Mixed Alzheimer's and vascular dementia ([place]) Aricept and  namenda Doing well in  Memory unit  2. Multiple closed fractures of pelvis without disruption of pelvic ring, sequela Pain Controlled Discontinue Percocet Tylenol PRN  3. Senile osteoporosis On Fosamax since 05/22 Could not do Prolia due to expense in A  4. Generalized convulsive epilepsy without intractable epilepsy ([place]) On Lamictal  5. Essential hypertension Off all meds right now Conservative management Most BP are good in facility 6 B 12 def On Supplement   Family/ staff Communication:   Labs/tests ordered:

## 2022-11-06 DIAGNOSIS — S3282XA Multiple fractures of pelvis without disruption of pelvic ring, initial encounter for closed fracture: Secondary | ICD-10-CM | POA: Diagnosis not present

## 2022-11-06 DIAGNOSIS — F01B0 Vascular dementia, moderate, without behavioral disturbance, psychotic disturbance, mood disturbance, and anxiety: Secondary | ICD-10-CM | POA: Diagnosis not present

## 2022-11-06 DIAGNOSIS — R296 Repeated falls: Secondary | ICD-10-CM | POA: Diagnosis not present

## 2022-11-06 DIAGNOSIS — M25552 Pain in left hip: Secondary | ICD-10-CM | POA: Diagnosis not present

## 2022-11-06 DIAGNOSIS — R278 Other lack of coordination: Secondary | ICD-10-CM | POA: Diagnosis not present

## 2022-11-07 DIAGNOSIS — S3282XA Multiple fractures of pelvis without disruption of pelvic ring, initial encounter for closed fracture: Secondary | ICD-10-CM | POA: Diagnosis not present

## 2022-11-07 DIAGNOSIS — M25552 Pain in left hip: Secondary | ICD-10-CM | POA: Diagnosis not present

## 2022-11-07 DIAGNOSIS — R2681 Unsteadiness on feet: Secondary | ICD-10-CM | POA: Diagnosis not present

## 2022-11-07 DIAGNOSIS — R296 Repeated falls: Secondary | ICD-10-CM | POA: Diagnosis not present

## 2022-11-07 DIAGNOSIS — R2689 Other abnormalities of gait and mobility: Secondary | ICD-10-CM | POA: Diagnosis not present

## 2022-11-07 DIAGNOSIS — F01B0 Vascular dementia, moderate, without behavioral disturbance, psychotic disturbance, mood disturbance, and anxiety: Secondary | ICD-10-CM | POA: Diagnosis not present

## 2022-11-07 DIAGNOSIS — S3282XS Multiple fractures of pelvis without disruption of pelvic ring, sequela: Secondary | ICD-10-CM | POA: Diagnosis not present

## 2022-11-07 DIAGNOSIS — R278 Other lack of coordination: Secondary | ICD-10-CM | POA: Diagnosis not present

## 2022-11-08 DIAGNOSIS — S3282XS Multiple fractures of pelvis without disruption of pelvic ring, sequela: Secondary | ICD-10-CM | POA: Diagnosis not present

## 2022-11-08 DIAGNOSIS — R2689 Other abnormalities of gait and mobility: Secondary | ICD-10-CM | POA: Diagnosis not present

## 2022-11-08 DIAGNOSIS — R296 Repeated falls: Secondary | ICD-10-CM | POA: Diagnosis not present

## 2022-11-08 DIAGNOSIS — R278 Other lack of coordination: Secondary | ICD-10-CM | POA: Diagnosis not present

## 2022-11-08 DIAGNOSIS — R2681 Unsteadiness on feet: Secondary | ICD-10-CM | POA: Diagnosis not present

## 2022-11-12 DIAGNOSIS — R278 Other lack of coordination: Secondary | ICD-10-CM | POA: Diagnosis not present

## 2022-11-12 DIAGNOSIS — S3282XS Multiple fractures of pelvis without disruption of pelvic ring, sequela: Secondary | ICD-10-CM | POA: Diagnosis not present

## 2022-11-12 DIAGNOSIS — R296 Repeated falls: Secondary | ICD-10-CM | POA: Diagnosis not present

## 2022-11-12 DIAGNOSIS — R2689 Other abnormalities of gait and mobility: Secondary | ICD-10-CM | POA: Diagnosis not present

## 2022-11-12 DIAGNOSIS — R2681 Unsteadiness on feet: Secondary | ICD-10-CM | POA: Diagnosis not present

## 2022-11-14 DIAGNOSIS — R2681 Unsteadiness on feet: Secondary | ICD-10-CM | POA: Diagnosis not present

## 2022-11-14 DIAGNOSIS — R296 Repeated falls: Secondary | ICD-10-CM | POA: Diagnosis not present

## 2022-11-14 DIAGNOSIS — R2689 Other abnormalities of gait and mobility: Secondary | ICD-10-CM | POA: Diagnosis not present

## 2022-11-14 DIAGNOSIS — R278 Other lack of coordination: Secondary | ICD-10-CM | POA: Diagnosis not present

## 2022-11-14 DIAGNOSIS — S3282XS Multiple fractures of pelvis without disruption of pelvic ring, sequela: Secondary | ICD-10-CM | POA: Diagnosis not present

## 2022-11-15 DIAGNOSIS — R296 Repeated falls: Secondary | ICD-10-CM | POA: Diagnosis not present

## 2022-11-15 DIAGNOSIS — S3282XS Multiple fractures of pelvis without disruption of pelvic ring, sequela: Secondary | ICD-10-CM | POA: Diagnosis not present

## 2022-11-15 DIAGNOSIS — R2681 Unsteadiness on feet: Secondary | ICD-10-CM | POA: Diagnosis not present

## 2022-11-15 DIAGNOSIS — R2689 Other abnormalities of gait and mobility: Secondary | ICD-10-CM | POA: Diagnosis not present

## 2022-11-15 DIAGNOSIS — R278 Other lack of coordination: Secondary | ICD-10-CM | POA: Diagnosis not present

## 2022-11-19 DIAGNOSIS — S3282XS Multiple fractures of pelvis without disruption of pelvic ring, sequela: Secondary | ICD-10-CM | POA: Diagnosis not present

## 2022-11-19 DIAGNOSIS — R2689 Other abnormalities of gait and mobility: Secondary | ICD-10-CM | POA: Diagnosis not present

## 2022-11-19 DIAGNOSIS — R2681 Unsteadiness on feet: Secondary | ICD-10-CM | POA: Diagnosis not present

## 2022-11-19 DIAGNOSIS — R278 Other lack of coordination: Secondary | ICD-10-CM | POA: Diagnosis not present

## 2022-11-19 DIAGNOSIS — R296 Repeated falls: Secondary | ICD-10-CM | POA: Diagnosis not present

## 2022-11-20 ENCOUNTER — Non-Acute Institutional Stay (SKILLED_NURSING_FACILITY): Payer: Medicare Other | Admitting: Internal Medicine

## 2022-11-20 ENCOUNTER — Encounter: Payer: Self-pay | Admitting: Internal Medicine

## 2022-11-20 DIAGNOSIS — F0280 Dementia in other diseases classified elsewhere without behavioral disturbance: Secondary | ICD-10-CM

## 2022-11-20 DIAGNOSIS — S3282XS Multiple fractures of pelvis without disruption of pelvic ring, sequela: Secondary | ICD-10-CM

## 2022-11-20 DIAGNOSIS — F0150 Vascular dementia without behavioral disturbance: Secondary | ICD-10-CM

## 2022-11-20 DIAGNOSIS — M25561 Pain in right knee: Secondary | ICD-10-CM

## 2022-11-20 DIAGNOSIS — G309 Alzheimer's disease, unspecified: Secondary | ICD-10-CM | POA: Diagnosis not present

## 2022-11-20 NOTE — Progress Notes (Signed)
Location:  [facility] Room Number: 749S Place of Service:  SNF [phone]) Provider:  [name], MD   [name], MD  Patient Care Team: [name], MD as PCP - General (Internal Medicine) [name], MD (Inactive) as Consulting Physician (Neurology)  Extended Emergency Contact Information Primary Emergency Contact: [place] Phone: [phone] Mobile Phone: [phone] Relation: Relative Secondary Emergency Contact: wright,Jamiesha Mobile Phone: [phone] Relation: Daughter  Code Status:  DNR  Goals of care: Advanced Directive information    11/20/2022    2:04 PM  Advanced Directives  Does Patient Have a Medical Advance Directive? Yes  Type of Paramedic of [place];Out of facility DNR (pink MOST or yellow form)  Does patient want to make changes to medical advance directive? No - Patient declined  Copy of [place] in Chart? Yes - validated most recent copy scanned in chart (See row information)  Pre-existing out of facility DNR order (yellow form or pink MOST form) Yellow form placed in chart (order not valid for inpatient use)     Chief Complaint  Patient presents with   Acute Visit    Patient is having some knee pain    HPI:  Pt is a 88 y.o. female seen today for an acute visit for Right knee pain  Lives in Memory unit in [place]   Has h/o Alzheimer Dementia, h/o Seizure disorder and Osteoarthritis and Hypertension H/o Peripheral Neuropathy   Fell on 10/12/22 CT showed nondisplaced fractures of the right superior pubic ramus near the symphysis, and the left inferior pubic   Seen by Dr Ninfa Linden with no new recommendations   Per therapy walking with her walker and Contact guard But now also having Right knee pain Its Swollen but is Not red or warm  Able to bend down Pain mostly in Medial side of the knee  Past Medical History:  Diagnosis Date    Arthritis    Per [place] new patient packet    Epilepsy [facility])    Per [place] new patient packet    Glaucoma    Hearing deficit    Hearing aids   High blood pressure    Per [facility] new patient packet    History of abnormal weight loss    Low back pain    Memory difficulty 05/09/2016   MGUS (monoclonal gammopathy of unknown significance)    Neuropathy associated with MGUS ([place]) 11/09/2015   Radiculopathy of sacral and sacrococcygeal region    S1   Seizures ([place])    Past Surgical History:  Procedure Laterality Date   APPENDECTOMY     BACK SURGERY     CATARACT EXTRACTION Left    OVARY SURGERY     PARATHYROIDECTOMY  9/   TRABECULECTOMY Left     Allergies  Allergen Reactions   Hctz [Hydrochlorothiazide]    Other Other (See Comments)    Caused sodium to drop    Outpatient Encounter Medications as of 11/20/2022  Medication Sig   acetaminophen (TYLENOL) 325 MG tablet Take 650 mg by mouth every 8 (eight) hours.   alendronate (FOSAMAX) 70 MG tablet Take 70 mg by mouth once a week. Take with a full glass of water on an empty stomach. Friday   bimatoprost (LUMIGAN) 0.01 % SOLN Place 1 drop into both eyes at bedtime.   brimonidine (ALPHAGAN) 0.2 % ophthalmic solution Place 1 drop into the right eye 3 (three) times daily. Wait 10 minutes between drops. (  Keep eyes closed for 2 minutes after each drop)   chlorhexidine (PERIDEX) 0.12 % solution Use as directed 5 mLs in the mouth or throat at bedtime. 1 tsp   Cholecalciferol (VITAMIN D3) 50 MCG (2000 UT) TABS Take by mouth.   cyanocobalamin 1000 MCG tablet Take 1,000 mcg by mouth in the morning.   diclofenac Sodium (VOLTAREN) 1 % GEL Apply 4 g topically as needed (Apply to left hip prn for pain or legft thumb pain).   docusate sodium (COLACE) 100 MG capsule Take 100 mg by mouth daily.   donepezil (ARICEPT) 10 MG tablet Take 10 mg by mouth at bedtime.   dorzolamide-timolol (COSOPT) 22.3-6.8 MG/ML ophthalmic solution Place 1 drop into the right eye 2  (two) times daily. Wait 10 minutes between drops. (Keep eyes closed for 2 minutes after each drop)   Eyelid Cleansers (OCUSOFT EYELID CLEANSING) PADS Apply topically. Massage LEFT eyelid/lash line for 3 mins BID As Needed   lamoTRIgine (LAMICTAL) 100 MG tablet Take 1 tablet in morning, 1 and 1/2 tablets at night   lamoTRIgine (LAMICTAL) 100 MG tablet Take 100 mg by mouth daily. Daily in the morning.   memantine (NAMENDA) 5 MG tablet Take 10 mg by mouth 2 (two) times daily.   Multiple Vitamin (MULTIVITAMIN) tablet Take 1 tablet by mouth daily.   pilocarpine (PILOCAR) 4 % ophthalmic solution Place 1 drop into the right eye 4 (four) times daily. Wait 10 minutes between drops. (Keep eyes closed for 2 minutes after each drop)   Polyethyl Glycol-Propyl Glycol 0.4-0.3 % SOLN Place 1 drop into both eyes as needed. Wait 10 minutes between drops. (Keep eyes closed for 2 minutes after each drop)   RHOPRESSA 0.02 % SOLN Apply 1 drop to eye 4 (four) times daily.   No facility-administered encounter medications on file as of 11/20/2022.    Review of Systems  Unable to perform ROS: Dementia    Immunization History  Administered Date(s) Administered   Covid-19, Mrna,Vaccine(Spikevax)71yr and older 10/08/2022   Influenza, High Dose Seasonal PF 09/26/2015, 09/03/2017, 08/28/2018, 09/23/2020, 09/07/2022   Influenza,inj,Quad PF,6+ Mos 10/01/2019   Influenza-Unspecified 09/22/2009, 08/27/2011, 10/08/2012, 12/08/2014, 02/10/2018, 09/06/2021   Moderna Covid-19 Vaccine Bivalent Booster 145yr& up 04/19/2022   Moderna Sars-Covid-2 Vaccination 12/14/2019, 01/12/2020, 09/20/2020, 10/13/2020, 10/08/2022   Pneumococcal Conjugate-13 12/31/2013   Pneumococcal Polysaccharide-23 12/04/1999, 02/02/2016   Td 12/04/2000   Tdap 11/07/2012   Zoster Recombinat (Shingrix) 04/01/2022, 06/01/2022   Pertinent  Health Maintenance Due  Topic Date Due   INFLUENZA VACCINE  Completed   DEXA SCAN  Completed      05/03/2021    10:14 AM 02/07/2022   11:26 AM 03/30/2022    9:35 AM 10/12/2022    3:54 AM 11/20/2022    2:03 PM  Fall Risk  Falls in the past year? 0 0 0  0  Was there an injury with Fall?  0 0  0  Fall Risk Category Calculator  0 0  0  Fall Risk Category  Low Low  Low  Patient Fall Risk Level  Low fall risk Low fall risk High fall risk Low fall risk  Patient at Risk for Falls Due to  No Fall Risks     Fall risk Follow up  Falls evaluation completed   Falls evaluation completed   Functional Status Survey:    Vitals:   11/20/22 1344  BP: (!) 150/75  Pulse: 81  Resp: 20  Temp: 97.9 F (36.6 C)  TempSrc: Temporal  SpO2: 97%  Weight: 133 lb 6.4 oz (60.5 kg)  Height: '5\' 5"'$  (1.651 m)   Body mass index is 22.2 kg/m. Physical Exam Vitals reviewed.  Constitutional:      Appearance: Normal appearance.  HENT:     Head: Normocephalic.     Nose: Nose normal.     Mouth/Throat:     Mouth: Mucous membranes are moist.     Pharynx: Oropharynx is clear.  Eyes:     Pupils: Pupils are equal, round, and reactive to light.  Cardiovascular:     Rate and Rhythm: Normal rate and regular rhythm.     Pulses: Normal pulses.     Heart sounds: Normal heart sounds. No murmur heard. Pulmonary:     Effort: Pulmonary effort is normal.     Breath sounds: Normal breath sounds.  Abdominal:     General: Abdomen is flat. Bowel sounds are normal.     Palpations: Abdomen is soft.  Musculoskeletal:        General: No swelling.     Cervical back: Neck supple.     Comments: Right knee swelling present.  Not tender or red or warm Moving it well. Able to bend it easily   Skin:    General: Skin is warm.  Neurological:     General: No focal deficit present.     Mental Status: She is alert.  Psychiatric:        Mood and Affect: Mood normal.        Thought Content: Thought content normal.     Labs reviewed: Recent Labs    06/12/22 0000 10/12/22 0923  NA 142 140  K 4.1 3.7  CL 105 107  CO2 24* 25  GLUCOSE   --  111*  BUN 13 13  CREATININE 0.1* 0.81  CALCIUM 8.9 8.5*   No results for input(s): "AST", "ALT", "ALKPHOS", "BILITOT", "PROT", "ALBUMIN" in the last 8760 hours. Recent Labs    06/12/22 0000 10/12/22 0923  WBC 5.4 9.5  HGB 12.8 12.0  HCT 34* 36.2  MCV  --  96.0  PLT 171 142*   Lab Results  Component Value Date   TSH 1.91 11/21/2021   No results found for: "HGBA1C" Lab Results  Component Value Date   CHOL 241 (A) 09/10/2019   HDL 99 (A) 09/10/2019   LDLCALC 128 09/10/2019   TRIG 72 09/10/2019    Significant Diagnostic Results in last 30 days:  XR HIP UNILAT W OR W/O PELVIS 1V LEFT  Result Date: 11/01/2022 An AP pelvis shows normal hips bilaterally.  Nondisplaced rami fractures on the left side that were seen on CT scan 3 weeks ago are not visible on plain films today.   Assessment/Plan Right knee pain Xray showed no Acute Fracture Moderate Arthritis Will do Meloxicam 7.5 mg QD with food for 5 days Prilosec 20 mg QD   2. Mixed Alzheimer's and vascular dementia ([place]) Aricept and Namenda  3. Multiple closed fractures of pelvis without disruption of pelvic ring, sequela Tylenol PRn   Family/ staff Communication:   Labs/tests ordered:

## 2022-11-21 DIAGNOSIS — R278 Other lack of coordination: Secondary | ICD-10-CM | POA: Diagnosis not present

## 2022-11-21 DIAGNOSIS — R2689 Other abnormalities of gait and mobility: Secondary | ICD-10-CM | POA: Diagnosis not present

## 2022-11-21 DIAGNOSIS — R296 Repeated falls: Secondary | ICD-10-CM | POA: Diagnosis not present

## 2022-11-21 DIAGNOSIS — S3282XS Multiple fractures of pelvis without disruption of pelvic ring, sequela: Secondary | ICD-10-CM | POA: Diagnosis not present

## 2022-11-21 DIAGNOSIS — R2681 Unsteadiness on feet: Secondary | ICD-10-CM | POA: Diagnosis not present

## 2022-11-29 DIAGNOSIS — R2681 Unsteadiness on feet: Secondary | ICD-10-CM | POA: Diagnosis not present

## 2022-11-29 DIAGNOSIS — R2689 Other abnormalities of gait and mobility: Secondary | ICD-10-CM | POA: Diagnosis not present

## 2022-11-29 DIAGNOSIS — S3282XS Multiple fractures of pelvis without disruption of pelvic ring, sequela: Secondary | ICD-10-CM | POA: Diagnosis not present

## 2022-11-29 DIAGNOSIS — R296 Repeated falls: Secondary | ICD-10-CM | POA: Diagnosis not present

## 2022-11-29 DIAGNOSIS — R278 Other lack of coordination: Secondary | ICD-10-CM | POA: Diagnosis not present

## 2022-12-11 ENCOUNTER — Encounter: Payer: Self-pay | Admitting: Orthopedic Surgery

## 2022-12-11 ENCOUNTER — Non-Acute Institutional Stay (SKILLED_NURSING_FACILITY): Payer: Medicare Other | Admitting: Orthopedic Surgery

## 2022-12-11 DIAGNOSIS — E042 Nontoxic multinodular goiter: Secondary | ICD-10-CM | POA: Diagnosis not present

## 2022-12-11 DIAGNOSIS — D692 Other nonthrombocytopenic purpura: Secondary | ICD-10-CM

## 2022-12-11 DIAGNOSIS — F0150 Vascular dementia without behavioral disturbance: Secondary | ICD-10-CM

## 2022-12-11 DIAGNOSIS — E538 Deficiency of other specified B group vitamins: Secondary | ICD-10-CM | POA: Diagnosis not present

## 2022-12-11 DIAGNOSIS — H401132 Primary open-angle glaucoma, bilateral, moderate stage: Secondary | ICD-10-CM

## 2022-12-11 DIAGNOSIS — M810 Age-related osteoporosis without current pathological fracture: Secondary | ICD-10-CM

## 2022-12-11 DIAGNOSIS — R2681 Unsteadiness on feet: Secondary | ICD-10-CM | POA: Diagnosis not present

## 2022-12-11 DIAGNOSIS — F0280 Dementia in other diseases classified elsewhere without behavioral disturbance: Secondary | ICD-10-CM

## 2022-12-11 DIAGNOSIS — I10 Essential (primary) hypertension: Secondary | ICD-10-CM | POA: Diagnosis not present

## 2022-12-11 DIAGNOSIS — G40309 Generalized idiopathic epilepsy and epileptic syndromes, not intractable, without status epilepticus: Secondary | ICD-10-CM

## 2022-12-11 DIAGNOSIS — G309 Alzheimer's disease, unspecified: Secondary | ICD-10-CM | POA: Diagnosis not present

## 2022-12-11 NOTE — Progress Notes (Signed)
Location:  [facility] Room Number: [place] of Service:  SNF ([phone]) Provider:  Windell Moulding, NP   Patient Care Team: [name], MD as PCP - General (Internal Medicine) [name], MD (Inactive) as Consulting Physician (Neurology)  Extended Emergency Contact Information Primary Emergency Contact: [place] Phone: [phone] Mobile Phone: [phone] Relation: Relative Secondary Emergency Contact: wright,Shephanie Mobile Phone: [phone] Relation: Daughter  Code Status:  DNR Goals of care: Advanced Directive information    12/11/2022   11:12 AM  Advanced Directives  Type of Advance Directive Healthcare Power of [place];Out of facility DNR (pink MOST or yellow form)  Does patient want to make changes to medical advance directive? No - Patient declined  Copy of [place] in Chart? Yes - validated most recent copy scanned in chart (See row information)     Chief Complaint  Patient presents with   Medical Management of Chronic Issues    Routine Visit    HPI:  Pt is a 89 y.o. female seen today for medical management of chronic diseases.   She currently resides on the memory care unit at [facility] due to AD. PMH: HTN, COPD, epilepsy, neuropathy, pseudogout,OA, osteoporosis, open angle glaucoma.   Osteoporosis- DEXA 2021, t score -5.2, remains on Fosamax HTN- BUN/creat 13/0.81 10/12/2022, not on medication Alzheimer's dementia- no behaviors, MMSE 18/30 03/2022, remains on Aricept and Namenda Thyroid nodules- h/o thyroid nodules to both lobes, nodule 1.6 x 1.1 x 1 (right lobe), repeat U/S in 1 year- 11/2022, TSH 1.91/ T4 5.8 11/2021  Epilepsy- followed by neurology, no recent seizures, remains on Lamictal  Unstable gait- 11/10 mechanical fall, sent to ED for evaluation-CT left hip negative for fracture/multiple nondisplaced fractures to right and left superior pubic ramus, no recent falls, ambulates  with rolator Glaucoma- followed by ophthalmology, remains on brimonidine/bimatoprost/dorzolamide-timolol/pilocarpine  B12 deficiency- remains on cyanocobalamin, vitamin B12 295 11/07/2021  Recent blood pressures:  12/20- 152/83  12/06- 150/75  11/29- 150/82  Recent weights:  01/01- 128.9 lbs  12/01- 133.4 lbs  11/01- 127.6 lbs  Past Medical History:  Diagnosis Date   Arthritis    Per [place] new patient packet    Epilepsy ([place])    Per [place] new patient packet    Glaucoma    Hearing deficit    Hearing aids   High blood pressure    Per [place] new patient packet    History of abnormal weight loss    Low back pain    Memory difficulty 05/09/2016   MGUS (monoclonal gammopathy of unknown significance)    Neuropathy associated with MGUS ([place]) 11/09/2015   Radiculopathy of sacral and sacrococcygeal region    S1   Seizures (H[facility])    Past Surgical History:  Procedure Laterality Date   APPENDECTOMY     BACK SURGERY     CATARACT EXTRACTION Left    OVARY SURGERY     PARATHYROIDECTOMY  9/   TRABECULECTOMY Left     Allergies  Allergen Reactions   Hctz [Hydrochlorothiazide]    Other Other (See Comments)    Caused sodium to drop    Outpatient Encounter Medications as of 12/11/2022  Medication Sig   acetaminophen (TYLENOL) 325 MG tablet Take 650 mg by mouth every 8 (eight) hours.   alendronate (FOSAMAX) 70 MG tablet Take 70 mg by mouth once a week. Take with a full glass of water on an empty stomach. Friday   bimatoprost (LUMIGAN) 0.01 % SOLN Place 1  drop into both eyes at bedtime.   brimonidine (ALPHAGAN) 0.2 % ophthalmic solution Place 1 drop into the right eye 3 (three) times daily. Wait 10 minutes between drops. (Keep eyes closed for 2 minutes after each drop)   chlorhexidine (PERIDEX) 0.12 % solution Use as directed 5 mLs in the mouth or throat at bedtime. 1 tsp   Cholecalciferol (VITAMIN D3) 50 MCG (2000 UT) TABS Take by mouth.   cyanocobalamin 1000 MCG tablet Take 1,000 mcg by mouth  in the morning.   diclofenac Sodium (VOLTAREN) 1 % GEL Apply 4 g topically as needed (Apply to left hip prn for pain or legft thumb pain).   docusate sodium (COLACE) 100 MG capsule Take 100 mg by mouth daily.   donepezil (ARICEPT) 10 MG tablet Take 10 mg by mouth at bedtime.   dorzolamide-timolol (COSOPT) 22.3-6.8 MG/ML ophthalmic solution Place 1 drop into the right eye 2 (two) times daily. Wait 10 minutes between drops. (Keep eyes closed for 2 minutes after each drop)   Eyelid Cleansers (OCUSOFT EYELID CLEANSING) PADS Apply topically. Massage LEFT eyelid/lash line for 3 mins BID As Needed   lamoTRIgine (LAMICTAL) 100 MG tablet Take 1 tablet in morning, 1 and 1/2 tablets at night   lamoTRIgine (LAMICTAL) 100 MG tablet Take 100 mg by mouth daily. Daily in the morning.   memantine (NAMENDA) 5 MG tablet Take 10 mg by mouth 2 (two) times daily.   Multiple Vitamin (MULTIVITAMIN) tablet Take 1 tablet by mouth daily.   pilocarpine (PILOCAR) 4 % ophthalmic solution Place 1 drop into the right eye 4 (four) times daily. Wait 10 minutes between drops. (Keep eyes closed for 2 minutes after each drop)   Polyethyl Glycol-Propyl Glycol 0.4-0.3 % SOLN Place 1 drop into both eyes as needed. Wait 10 minutes between drops. (Keep eyes closed for 2 minutes after each drop)   RHOPRESSA 0.02 % SOLN Apply 1 drop to eye 4 (four) times daily.   No facility-administered encounter medications on file as of 12/11/2022.    Review of Systems  Unable to perform ROS: Dementia    Immunization History  Administered Date(s) Administered   Covid-19, Mrna,Vaccine(Spikevax)75yr and older 10/08/2022   Influenza, High Dose Seasonal PF 09/26/2015, 09/03/2017, 08/28/2018, 09/23/2020, 09/07/2022   Influenza,inj,Quad PF,6+ Mos 10/01/2019   Influenza-Unspecified 09/22/2009, 08/27/2011, 10/08/2012, 12/08/2014, 02/10/2018, 09/06/2021   Moderna Covid-19 Vaccine Bivalent Booster 114yr& up 04/19/2022   Moderna Sars-Covid-2 Vaccination  12/14/2019, 01/12/2020, 09/20/2020, 10/13/2020, 10/08/2022   Pneumococcal Conjugate-13 12/31/2013   Pneumococcal Polysaccharide-23 12/04/1999, 02/02/2016   Td 12/04/2000   Tdap 11/07/2012   Zoster Recombinat (Shingrix) 04/01/2022, 06/01/2022   Pertinent  Health Maintenance Due  Topic Date Due   INFLUENZA VACCINE  Completed   DEXA SCAN  Completed      02/07/2022   11:26 AM 03/30/2022    9:35 AM 10/12/2022    3:54 AM 11/20/2022    2:03 PM 12/11/2022   11:10 AM  Fall Risk  Falls in the past year? 0 0  0 0  Was there an injury with Fall? 0 0  0 0  Fall Risk Category Calculator 0 0  0 0  Fall Risk Category Low Low  Low Low  Patient Fall Risk Level Low fall risk Low fall risk High fall risk Low fall risk Low fall risk  Patient at Risk for Falls Due to No Fall Risks    No Fall Risks  Fall risk Follow up Falls evaluation completed   Falls evaluation completed  Falls evaluation completed   Functional Status Survey:    Vitals:   12/11/22 1105  BP: (!) 152/83  Pulse: 98  Resp: 20  Temp: 98.1 F (36.7 C)  SpO2: 97%  Weight: 128 lb 9 oz (58.3 kg)  Height: '5\' 5"'$  (1.651 m)   Body mass index is 21.39 kg/m. Physical Exam Vitals reviewed.  Constitutional:      General: She is not in acute distress. HENT:     Head: Normocephalic.     Right Ear: There is no impacted cerumen.     Left Ear: There is no impacted cerumen.     Nose: Nose normal.     Mouth/Throat:     Mouth: Mucous membranes are moist.  Eyes:     General:        Right eye: No discharge.        Left eye: No discharge.  Cardiovascular:     Rate and Rhythm: Normal rate and regular rhythm.     Pulses: Normal pulses.     Heart sounds: Normal heart sounds.  Pulmonary:     Effort: Pulmonary effort is normal. No respiratory distress.     Breath sounds: Normal breath sounds. No wheezing.  Abdominal:     General: Bowel sounds are normal. There is no distension.     Palpations: Abdomen is soft.     Tenderness: There is no  abdominal tenderness.  Musculoskeletal:     Cervical back: Neck supple.     Right lower leg: No edema.     Left lower leg: No edema.  Skin:    General: Skin is warm and dry.     Capillary Refill: Capillary refill takes less than 2 seconds.     Comments: Non tender purple lesions to upper extremities, vary in size,no skin breakdown  Neurological:     General: No focal deficit present.     Mental Status: She is alert. Mental status is at baseline.     Motor: Weakness present.     Gait: Gait abnormal.     Comments: rolator  Psychiatric:        Mood and Affect: Mood normal.     Comments: Very pleasant, follows commands, alert to self/familiar face     Labs reviewed: Recent Labs    06/12/22 0000 10/12/22 0923  NA 142 140  K 4.1 3.7  CL 105 107  CO2 24* 25  GLUCOSE  --  111*  BUN 13 13  CREATININE 0.1* 0.81  CALCIUM 8.9 8.5*   No results for input(s): "AST", "ALT", "ALKPHOS", "BILITOT", "PROT", "ALBUMIN" in the last 8760 hours. Recent Labs    06/12/22 0000 10/12/22 0923  WBC 5.4 9.5  HGB 12.8 12.0  HCT 34* 36.2  MCV  --  96.0  PLT 171 142*   Lab Results  Component Value Date   TSH 1.91 11/21/2021   No results found for: "HGBA1C" Lab Results  Component Value Date   CHOL 241 (A) 09/10/2019   HDL 99 (A) 09/10/2019   LDLCALC 128 09/10/2019   TRIG 72 09/10/2019    Significant Diagnostic Results in last 30 days:  No results found.  Assessment/Plan 1. Senile osteoporosis - DEXA 2021, t score -5.2 - cont Fosamax  2. Essential hypertension - controlled without medication  3. Mixed Alzheimer's and vascular dementia ([place]) - no behaviors - doing well in memory care - ambulates with rolator - cont Aricept and Namenda  4. Multiple thyroid nodules - involves both lobes -  thyroid ultrasound  5. Generalized convulsive epilepsy without intractable epilepsy ([place]) - no recent seizures - followed by neuro - cont Lamictal  6. Unstable gait - 10/12/2022  mechanical fall> pelvic fracture - ambulating well with rolator  7. Senile purpura ([place])   8. Primary open angle glaucoma (POAG) of both eyes, moderate stage - cont brimonidine/bimatoprost/dorzolamide-timolol/pilocarpine   9. Vitamin B 12 deficiency - cont B12 supplement    Family/ staff Communication: plan discussed with patient and nursing  Labs/tests ordered:  thyroid ultrasound

## 2022-12-13 DIAGNOSIS — R946 Abnormal results of thyroid function studies: Secondary | ICD-10-CM | POA: Diagnosis not present

## 2022-12-19 ENCOUNTER — Telehealth: Payer: Self-pay | Admitting: Orthopedic Surgery

## 2022-12-19 NOTE — Telephone Encounter (Signed)
Thyroid ultrasound results discussed with HPOA, [name]. He does not want aggressive workup at this time. Will disregard recommendations for fine needle aspiration and future thyroid ultrasound this time. [facility] nursing informed.

## 2023-01-15 ENCOUNTER — Non-Acute Institutional Stay (SKILLED_NURSING_FACILITY): Payer: Medicare Other | Admitting: Orthopedic Surgery

## 2023-01-15 ENCOUNTER — Encounter: Payer: Self-pay | Admitting: Orthopedic Surgery

## 2023-01-15 DIAGNOSIS — F0280 Dementia in other diseases classified elsewhere without behavioral disturbance: Secondary | ICD-10-CM

## 2023-01-15 DIAGNOSIS — Z23 Encounter for immunization: Secondary | ICD-10-CM

## 2023-01-15 DIAGNOSIS — F0150 Vascular dementia without behavioral disturbance: Secondary | ICD-10-CM

## 2023-01-15 DIAGNOSIS — I10 Essential (primary) hypertension: Secondary | ICD-10-CM

## 2023-01-15 DIAGNOSIS — M810 Age-related osteoporosis without current pathological fracture: Secondary | ICD-10-CM

## 2023-01-15 DIAGNOSIS — E538 Deficiency of other specified B group vitamins: Secondary | ICD-10-CM | POA: Diagnosis not present

## 2023-01-15 DIAGNOSIS — H401132 Primary open-angle glaucoma, bilateral, moderate stage: Secondary | ICD-10-CM

## 2023-01-15 DIAGNOSIS — G309 Alzheimer's disease, unspecified: Secondary | ICD-10-CM

## 2023-01-15 DIAGNOSIS — G40309 Generalized idiopathic epilepsy and epileptic syndromes, not intractable, without status epilepticus: Secondary | ICD-10-CM

## 2023-01-15 DIAGNOSIS — E042 Nontoxic multinodular goiter: Secondary | ICD-10-CM

## 2023-01-15 DIAGNOSIS — K5901 Slow transit constipation: Secondary | ICD-10-CM

## 2023-01-15 NOTE — Progress Notes (Signed)
Location:   [place] Room Number: [id number] Place of Service:  ALF [phone]) Provider:  Windell Moulding, NP  [name], MD  Patient Care Team: [name], MD as PCP - General (Internal Medicine) [name], MD (Inactive) as Consulting Physician (Neurology)  Extended Emergency Contact Information Primary Emergency Contact: [place] Phone: [phone] Mobile Phone: [phone] Relation: Relative Secondary Emergency Contact: wright,[place] Mobile Phone: [phone] Relation: Daughter  Code Status:  DNR Goals of care: Advanced Directive information    01/15/2023    1:55 PM  Advanced Directives  Does Patient Have a Medical Advance Directive? Yes  Type of Paramedic of [place];Out of facility DNR (pink MOST or yellow form)  Does patient want to make changes to medical advance directive? No - Patient declined  Copy of [place] in Chart? Yes - validated most recent copy scanned in chart (See row information)  Pre-existing out of facility DNR order (yellow form or pink MOST form) Yellow form placed in chart (order not valid for inpatient use)     Chief Complaint  Patient presents with   Medical Management of Chronic Issues    Routine follow up   Immunizations    Tdap and COVID booster due    HPI:  Pt is a 89 y.o. female seen today for medical management of chronic diseases.    She currently resides on the memory care unit at [facility] due to AD. PMH: HTN, COPD, epilepsy, neuropathy, pseudogout,OA, osteoporosis, pelvic fracture 10/2022, open angle glaucoma.    Thyroid nodules- 12/14/2022 thyroid ultrasound with nodule 3.5 cm x 1.9 cm x 1.4 cm (right lobe)>fine needle aspiration recommended> HPOA does not want aggressive treatment, TSH 1.91/ T4 5.8 11/2021  Osteoporosis- DEXA 2021, t score -5.2, remains on Fosamax HTN- BUN/creat 13/0.81 10/12/2022, not on  medication Alzheimer's dementia- no behaviors, MMSE 18/30 03/2022, remains on Aricept and Namenda Epilepsy- followed by neurology, no recent seizures, remains on Lamictal  Glaucoma- followed by ophthalmology, remains on brimonidine/bimatoprost/dorzolamide-timolol/pilocarpine  B12 deficiency- remains on cyanocobalamin, vitamin B12 295 11/07/2021 Constipation- LBM 01/14/2023, remains on colace  Recent blood pressure:  02/07- 144/76  01/31- 130/69  01/24- 124/74  Recent weights:  02/01- 128.6 lbs  01/01- 128.9 lbs  12/01- 133.4 lbs   Past Surgical History:  Procedure Laterality Date   APPENDECTOMY     BACK SURGERY     CATARACT EXTRACTION Left    OVARY SURGERY     PARATHYROIDECTOMY  9/   TRABECULECTOMY Left     Allergies  Allergen Reactions   Hctz [Hydrochlorothiazide]    Other Other (See Comments)    Caused sodium to drop    Allergies as of 01/15/2023       Reactions   Hctz [hydrochlorothiazide]    Other Other (See Comments)   Caused sodium to drop        Medication List        Accurate as of January 15, 2023  2:02 PM. If you have any questions, ask your nurse or doctor.          STOP taking these medications    OcuSoft Eyelid Cleansing Pads Stopped by: Yvonna Alanis, NP   Polyethyl Glycol-Propyl Glycol 0.4-0.3 % Soln Stopped by: Yvonna Alanis, NP       TAKE these medications    acetaminophen 325 MG tablet Commonly known as: TYLENOL Take 650 mg by mouth every 8 (eight) hours.   alendronate 70 MG  tablet Commonly known as: FOSAMAX Take 70 mg by mouth once a week. Take with a full glass of water on an empty stomach. Friday   bimatoprost 0.01 % Soln Commonly known as: LUMIGAN Place 1 drop into both eyes at bedtime.   brimonidine 0.2 % ophthalmic solution Commonly known as: ALPHAGAN Place 1 drop into the right eye 3 (three) times daily. Wait 10 minutes between drops. (Keep eyes closed for 2 minutes after each drop)   chlorhexidine 0.12 %  solution Commonly known as: PERIDEX Use as directed 5 mLs in the mouth or throat at bedtime. 1 tsp   cyanocobalamin 1000 MCG tablet Take 1,000 mcg by mouth in the morning.   diclofenac Sodium 1 % Gel Commonly known as: VOLTAREN Apply 4 g topically as needed (Apply to left hip prn for pain or legft thumb pain).   docusate sodium 100 MG capsule Commonly known as: COLACE Take 100 mg by mouth daily.   donepezil 10 MG tablet Commonly known as: ARICEPT Take 10 mg by mouth at bedtime.   dorzolamide-timolol 2-0.5 % ophthalmic solution Commonly known as: COSOPT Place 1 drop into the right eye 2 (two) times daily. Wait 10 minutes between drops. (Keep eyes closed for 2 minutes after each drop)   lamoTRIgine 100 MG tablet Commonly known as: LAMICTAL Take 1 tablet in morning, 1 and 1/2 tablets at night   memantine 5 MG tablet Commonly known as: NAMENDA Take 10 mg by mouth 2 (two) times daily.   multivitamin tablet Take 1 tablet by mouth daily.   pilocarpine 4 % ophthalmic solution Commonly known as: PILOCAR Place 1 drop into the right eye 4 (four) times daily. Wait 10 minutes between drops. (Keep eyes closed for 2 minutes after each drop)   Rhopressa 0.02 % Soln Generic drug: Netarsudil Dimesylate Apply 1 drop to eye 4 (four) times daily.   Vitamin D3 50 MCG (2000 UT) Tabs Generic drug: Cholecalciferol Take by mouth.        Review of Systems  Unable to perform ROS: Dementia    Immunization History  Administered Date(s) Administered   Covid-19, Mrna,Vaccine(Spikevax)60yr and older 10/08/2022   Influenza, High Dose Seasonal PF 09/26/2015, 09/03/2017, 08/28/2018, 09/23/2020, 09/07/2022   Influenza,inj,Quad PF,6+ Mos 10/01/2019   Influenza-Unspecified 09/22/2009, 08/27/2011, 10/08/2012, 12/08/2014, 02/10/2018, 09/06/2021   Moderna Covid-19 Vaccine Bivalent Booster 152yr& up 04/19/2022   Moderna Sars-Covid-2 Vaccination 12/14/2019, 01/12/2020, 09/20/2020, 10/13/2020,  10/08/2022   Pneumococcal Conjugate-13 12/31/2013   Pneumococcal Polysaccharide-23 12/04/1999, 02/02/2016   Td 12/04/2000   Tdap 11/07/2012   Zoster Recombinat (Shingrix) 04/01/2022, 06/01/2022   Pertinent  Health Maintenance Due  Topic Date Due   INFLUENZA VACCINE  Completed   DEXA SCAN  Completed      02/07/2022   11:26 AM 03/30/2022    9:35 AM 10/12/2022    3:54 AM 11/20/2022    2:03 PM 12/11/2022   11:10 AM  Fall Risk  Falls in the past year? 0 0  0 0  Was there an injury with Fall? 0 0  0 0  Fall Risk Category Calculator 0 0  0 0  Fall Risk Category (Retired) Low Low  Low Low  (RETIRED) Patient Fall Risk Level Low fall risk Low fall risk High fall risk Low fall risk Low fall risk  Patient at Risk for Falls Due to No Fall Risks    No Fall Risks  Fall risk Follow up Falls evaluation completed   Falls evaluation completed Falls evaluation completed  Functional Status Survey:    Vitals:   01/15/23 1353  BP: (!) 144/76  Pulse: 63  Resp: 16  Temp: 98.1 F (36.7 C)  SpO2: 95%  Weight: 128 lb 9.6 oz (58.3 kg)  Height: 5' 5"$  (1.651 m)   Body mass index is 21.4 kg/m. Physical Exam Vitals reviewed.  Constitutional:      General: She is not in acute distress. HENT:     Head: Normocephalic.     Right Ear: There is no impacted cerumen.     Left Ear: There is no impacted cerumen.     Nose: Nose normal.     Mouth/Throat:     Mouth: Mucous membranes are moist.  Eyes:     General:        Right eye: No discharge.        Left eye: No discharge.  Cardiovascular:     Rate and Rhythm: Normal rate and regular rhythm.     Pulses: Normal pulses.     Heart sounds: Normal heart sounds.  Pulmonary:     Effort: Pulmonary effort is normal. No respiratory distress.     Breath sounds: Normal breath sounds. No wheezing.  Abdominal:     General: Bowel sounds are normal. There is no distension.     Tenderness: There is no abdominal tenderness.  Musculoskeletal:     Cervical back:  Neck supple.     Right lower leg: No edema.     Left lower leg: No edema.  Skin:    General: Skin is warm and dry.     Capillary Refill: Capillary refill takes less than 2 seconds.  Neurological:     General: No focal deficit present.     Mental Status: She is alert. Mental status is at baseline.     Motor: Weakness present.     Gait: Gait abnormal.     Comments: wheelchair  Psychiatric:        Mood and Affect: Mood normal.        Behavior: Behavior normal.     Comments: Very pleasant, follows commands, alert to self/place/familiar face     Labs reviewed: Recent Labs    06/12/22 0000 10/12/22 0923  NA 142 140  K 4.1 3.7  CL 105 107  CO2 24* 25  GLUCOSE  --  111*  BUN 13 13  CREATININE 0.1* 0.81  CALCIUM 8.9 8.5*   No results for input(s): "AST", "ALT", "ALKPHOS", "BILITOT", "PROT", "ALBUMIN" in the last 8760 hours. Recent Labs    06/12/22 0000 10/12/22 0923  WBC 5.4 9.5  HGB 12.8 12.0  HCT 34* 36.2  MCV  --  96.0  PLT 171 142*   Lab Results  Component Value Date   TSH 1.91 11/21/2021   No results found for: "HGBA1C" Lab Results  Component Value Date   CHOL 241 (A) 09/10/2019   HDL 99 (A) 09/10/2019   LDLCALC 128 09/10/2019   TRIG 72 09/10/2019    Significant Diagnostic Results in last 30 days:  No results found.  Assessment/Plan 1. Multiple thyroid nodules - 12/14/2022 thyroid ultrasound> nodule 3.5 cm x 1.9 cm x 1.4 cm (right lobe)>fine needle aspiration recommended - HPOA refused any further workup per goals of care  2. Senile osteoporosis - DEXA 2021, t score -5.2 - cont Fosamax weekly  3. Essential hypertension - controlled without medication  4. Mixed Alzheimer's and vascular dementia [facility]) - MMSE 18/30 03/2022 - no behaviors  - ambulates with wheelchair -  weights stable - cont memory care/skilled nursing  5. Generalized convulsive epilepsy without intractable epilepsy ([place]) - no recent episodes - cont Lamictal  6. Primary open  angle glaucoma (POAG) of both eyes, moderate stage - cont brimonidine/bimatoprost/dorzolamide-timolol/pilocarpine   7. Vitamin B 12 deficiency - cont cyanocobalamin  8. Slow transit constipation - abdomen soft - LBM 02/12 - cont colace  9. Need for Tdap vaccination     Family/ staff Communication: plan discussed with patient and nursing   Labs/tests ordered:  tdap vaccination

## 2023-01-28 DIAGNOSIS — H401133 Primary open-angle glaucoma, bilateral, severe stage: Secondary | ICD-10-CM | POA: Diagnosis not present

## 2023-02-19 ENCOUNTER — Non-Acute Institutional Stay (SKILLED_NURSING_FACILITY): Payer: Medicare Other | Admitting: Orthopedic Surgery

## 2023-02-19 ENCOUNTER — Encounter: Payer: Self-pay | Admitting: Orthopedic Surgery

## 2023-02-19 DIAGNOSIS — G309 Alzheimer's disease, unspecified: Secondary | ICD-10-CM

## 2023-02-19 DIAGNOSIS — M810 Age-related osteoporosis without current pathological fracture: Secondary | ICD-10-CM

## 2023-02-19 DIAGNOSIS — G40309 Generalized idiopathic epilepsy and epileptic syndromes, not intractable, without status epilepticus: Secondary | ICD-10-CM | POA: Diagnosis not present

## 2023-02-19 DIAGNOSIS — H401132 Primary open-angle glaucoma, bilateral, moderate stage: Secondary | ICD-10-CM

## 2023-02-19 DIAGNOSIS — E042 Nontoxic multinodular goiter: Secondary | ICD-10-CM | POA: Diagnosis not present

## 2023-02-19 DIAGNOSIS — F0280 Dementia in other diseases classified elsewhere without behavioral disturbance: Secondary | ICD-10-CM

## 2023-02-19 DIAGNOSIS — F0150 Vascular dementia without behavioral disturbance: Secondary | ICD-10-CM

## 2023-02-19 NOTE — Progress Notes (Signed)
Location:  [place] Room Number: 315/A Place of Service:  SNF [phone]) Provider:  Yvonna Alanis, NP   [name], MD  Patient Care Team: [name], MD as PCP - General (Internal Medicine) [name], MD (Inactive) as Consulting Physician (Neurology)  Extended Emergency Contact Information Primary Emergency Contact: [place] Phone: [phone] Mobile Phone: [phone] Relation: Relative Secondary Emergency Contact: wright,Kon[place] Mobile Phone: [phone] Relation: Daughter  Code Status:  DNR Goals of care: Advanced Directive information    01/15/2023    1:55 PM  Advanced Directives  Does Patient Have a Medical Advance Directive? Yes  Type of Paramedic of [place];Out of facility DNR (pink MOST or yellow form)  Does patient want to make changes to medical advance directive? No - Patient declined  Copy of [place] in Chart? Yes - validated most recent copy scanned in chart (See row information)  Pre-existing out of facility DNR order (yellow form or pink MOST form) Yellow form placed in chart (order not valid for inpatient use)     Chief Complaint  Patient presents with   Medical Management of Chronic Issues    HPI:  Pt is a 89 y.o. female seen today for medical management of chronic diseases.    She currently resides on the memory care unit at [facility] due to AD. PMH: HTN, COPD, epilepsy, neuropathy, pseudogout,OA, osteoporosis, pelvic fracture 10/2022, open angle glaucoma.   Epilepsy- followed by neurology, no recent seizures, remains on Lamictal  Alzheimer's dementia- no behaviors, MMSE 18/30 03/2022, dependent with ADLs except feeding, ambulates with wheelchair, remains on Aricept and Namenda Glaucoma- followed by ophthalmology, remains on brimonidine/bimatoprost/dorzolamide-timolol/pilocarpine  Thyroid nodules- 12/14/2022 thyroid ultrasound with nodule  3.5 cm x 1.9 cm x 1.4 cm (right lobe)>fine needle aspiration recommended> HPOA does not want aggressive treatment, TSH 1.91/ T4 5.8 11/2021  Osteoporosis- DEXA 2021, t score -5.2, remains on Fosamax  No recent falls or injuries.   Recent blood pressures:  03/13- 160/78  03/06- 138/66  02/28- 128/69  Recent weights:  03/01- 127.8 lbs  02/01- 128.6 lbs  01/01- 128.9 lbs     Past Medical History:  Diagnosis Date   Arthritis    Per [place] new patient packet    Epilepsy ([address])    Per [place] new patient packet    Glaucoma    Hearing deficit    Hearing aids   High blood pressure    Per [facility] new patient packet    History of abnormal weight loss    Low back pain    Memory difficulty 05/09/2016   MGUS (monoclonal gammopathy of unknown significance)    Neuropathy associated with MGUS ([place]) 11/09/2015   Radiculopathy of sacral and sacrococcygeal region    S1   Seizures ([place])    Past Surgical History:  Procedure Laterality Date   APPENDECTOMY     BACK SURGERY     CATARACT EXTRACTION Left    OVARY SURGERY     PARATHYROIDECTOMY  9/   TRABECULECTOMY Left     Allergies  Allergen Reactions   Hctz [Hydrochlorothiazide]    Other Other (See Comments)    Caused sodium to drop    Outpatient Encounter Medications as of 02/19/2023  Medication Sig   acetaminophen (TYLENOL) 325 MG tablet Take 650 mg by mouth every 8 (eight) hours.   alendronate (FOSAMAX) 70 MG tablet Take 70 mg by mouth once a week. Take with a full glass of  water on an empty stomach. Friday   bimatoprost (LUMIGAN) 0.01 % SOLN Place 1 drop into both eyes at bedtime.   brimonidine (ALPHAGAN) 0.2 % ophthalmic solution Place 1 drop into the right eye 3 (three) times daily. Wait 10 minutes between drops. (Keep eyes closed for 2 minutes after each drop)   chlorhexidine (PERIDEX) 0.12 % solution Use as directed 5 mLs in the mouth or throat at bedtime. 1 tsp   Cholecalciferol (VITAMIN D3) 50 MCG (2000 UT) TABS Take by mouth.    cyanocobalamin 1000 MCG tablet Take 1,000 mcg by mouth in the morning.   diclofenac Sodium (VOLTAREN) 1 % GEL Apply 4 g topically as needed (Apply to left hip prn for pain or legft thumb pain).   docusate sodium (COLACE) 100 MG capsule Take 100 mg by mouth daily.   donepezil (ARICEPT) 10 MG tablet Take 10 mg by mouth at bedtime.   dorzolamide-timolol (COSOPT) 22.3-6.8 MG/ML ophthalmic solution Place 1 drop into the right eye 2 (two) times daily. Wait 10 minutes between drops. (Keep eyes closed for 2 minutes after each drop)   lamoTRIgine (LAMICTAL) 100 MG tablet Take 1 tablet in morning, 1 and 1/2 tablets at night   memantine (NAMENDA) 5 MG tablet Take 10 mg by mouth 2 (two) times daily.   Multiple Vitamin (MULTIVITAMIN) tablet Take 1 tablet by mouth daily.   pilocarpine (PILOCAR) 4 % ophthalmic solution Place 1 drop into the right eye 4 (four) times daily. Wait 10 minutes between drops. (Keep eyes closed for 2 minutes after each drop)   RHOPRESSA 0.02 % SOLN Apply 1 drop to eye 4 (four) times daily.   No facility-administered encounter medications on file as of 02/19/2023.    Review of Systems  Unable to perform ROS: Dementia    Immunization History  Administered Date(s) Administered   Covid-19, Mrna,Vaccine(Spikevax)24yrs and older 10/08/2022   Influenza, High Dose Seasonal PF 09/26/2015, 09/03/2017, 08/28/2018, 09/23/2020, 09/07/2022   Influenza,inj,Quad PF,6+ Mos 10/01/2019   Influenza-Unspecified 09/22/2009, 08/27/2011, 10/08/2012, 12/08/2014, 02/10/2018, 09/06/2021   Moderna Covid-19 Vaccine Bivalent Booster 70yrs & up 04/19/2022   Moderna Sars-Covid-2 Vaccination 12/14/2019, 01/12/2020, 09/20/2020, 10/13/2020, 10/08/2022   Pneumococcal Conjugate-13 12/31/2013   Pneumococcal Polysaccharide-23 12/04/1999, 02/02/2016   Td 12/04/2000   Tdap 11/07/2012   Zoster Recombinat (Shingrix) 04/01/2022, 06/01/2022   Pertinent  Health Maintenance Due  Topic Date Due   INFLUENZA VACCINE   Completed   DEXA SCAN  Completed      03/30/2022    9:35 AM 10/12/2022    3:54 AM 11/20/2022    2:03 PM 12/11/2022   11:10 AM 02/19/2023    2:40 PM  Fall Risk  Falls in the past year? 0  0 0 1  Was there an injury with Fall? 0  0 0 0  Fall Risk Category Calculator 0  0 0 1  Fall Risk Category (Retired) Low  Low Low   (RETIRED) Patient Fall Risk Level Low fall risk High fall risk Low fall risk Low fall risk   Patient at Risk for Falls Due to    No Fall Risks History of fall(s);Impaired balance/gait;Impaired mobility  Fall risk Follow up   Falls evaluation completed Falls evaluation completed Falls evaluation completed;Education provided;Falls prevention discussed   Functional Status Survey:    Vitals:   02/19/23 1439  BP: (!) 160/78  Pulse: 83  Resp: 18  Temp: (!) 96.8 F (36 C)  SpO2: 96%  Weight: 127 lb 12.8 oz (58 kg)  Height:  5\' 5"  (1.651 m)   Body mass index is 21.27 kg/m. Physical Exam Vitals reviewed.  Constitutional:      General: She is not in acute distress. HENT:     Head: Normocephalic.     Right Ear: There is no impacted cerumen.     Left Ear: There is no impacted cerumen.     Nose: Nose normal.     Mouth/Throat:     Mouth: Mucous membranes are moist.  Eyes:     General:        Right eye: No discharge.        Left eye: No discharge.  Cardiovascular:     Rate and Rhythm: Normal rate and regular rhythm.     Pulses: Normal pulses.     Heart sounds: Normal heart sounds.  Pulmonary:     Effort: Pulmonary effort is normal. No respiratory distress.     Breath sounds: Normal breath sounds. No wheezing.  Abdominal:     General: Bowel sounds are normal. There is no distension.     Palpations: Abdomen is soft.     Tenderness: There is no abdominal tenderness.  Musculoskeletal:     Cervical back: Neck supple.     Right lower leg: No edema.     Left lower leg: No edema.  Skin:    General: Skin is warm and dry.     Capillary Refill: Capillary refill takes  less than 2 seconds.  Neurological:     General: No focal deficit present.     Mental Status: She is alert. Mental status is at baseline.     Motor: Weakness present.     Gait: Gait abnormal.     Comments: wheelchair  Psychiatric:        Mood and Affect: Mood normal.     Comments: Very pleasant, follows simple commands, alert to self/familiar face     Labs reviewed: Recent Labs    06/12/22 0000 10/12/22 0923  NA 142 140  K 4.1 3.7  CL 105 107  CO2 24* 25  GLUCOSE  --  111*  BUN 13 13  CREATININE 0.1* 0.81  CALCIUM 8.9 8.5*   No results for input(s): "AST", "ALT", "ALKPHOS", "BILITOT", "PROT", "ALBUMIN" in the last 8760 hours. Recent Labs    06/12/22 0000 10/12/22 0923  WBC 5.4 9.5  HGB 12.8 12.0  HCT 34* 36.2  MCV  --  96.0  PLT 171 142*   Lab Results  Component Value Date   TSH 1.91 11/21/2021   No results found for: "HGBA1C" Lab Results  Component Value Date   CHOL 241 (A) 09/10/2019   HDL 99 (A) 09/10/2019   LDLCALC 128 09/10/2019   TRIG 72 09/10/2019    Significant Diagnostic Results in last 30 days:  No results found.  Assessment/Plan 1. Generalized convulsive epilepsy without intractable epilepsy ([place]) - no recent episodes - followed by neuro - cont Lamictal  2. Mixed Alzheimer's and vascular dementia ([place]) - no behaviors - dependent with ADLs - ambulates with w/c - weights stable - cont Aricept and Namenda  3. Primary open angle glaucoma (POAG) of both eyes, moderate stage - remains on brimonidine/bimatoprost/dorzolamide-timolol/pilocarpine  4. Multiple thyroid nodules - 12/14/2022 thyroid ultrasound with nodule 3.5 cm x 1.9 cm x 1.4 cm (right lobe)>fine needle aspiration recommended - HPOA does not want aggressive treatment  - TSH 1.91/ T4 5.8 11/2021  - recommend TSH 04/2023  5. Senile osteoporosis - h/o pelvic fracture 2023 - DEXA  2021, t score -5.2 - cont Fosamax    Family/ staff Communication: plan discussed with patient  and nurse  Labs/tests ordered:  none

## 2023-03-25 ENCOUNTER — Non-Acute Institutional Stay (SKILLED_NURSING_FACILITY): Payer: Medicare Other | Admitting: Internal Medicine

## 2023-03-25 ENCOUNTER — Encounter: Payer: Self-pay | Admitting: Internal Medicine

## 2023-03-25 DIAGNOSIS — G40309 Generalized idiopathic epilepsy and epileptic syndromes, not intractable, without status epilepticus: Secondary | ICD-10-CM

## 2023-03-25 DIAGNOSIS — M810 Age-related osteoporosis without current pathological fracture: Secondary | ICD-10-CM | POA: Diagnosis not present

## 2023-03-25 DIAGNOSIS — F0280 Dementia in other diseases classified elsewhere without behavioral disturbance: Secondary | ICD-10-CM

## 2023-03-25 DIAGNOSIS — G309 Alzheimer's disease, unspecified: Secondary | ICD-10-CM

## 2023-03-25 DIAGNOSIS — E538 Deficiency of other specified B group vitamins: Secondary | ICD-10-CM

## 2023-03-25 DIAGNOSIS — F0150 Vascular dementia without behavioral disturbance: Secondary | ICD-10-CM

## 2023-03-25 DIAGNOSIS — E042 Nontoxic multinodular goiter: Secondary | ICD-10-CM

## 2023-03-25 DIAGNOSIS — I10 Essential (primary) hypertension: Secondary | ICD-10-CM

## 2023-03-25 NOTE — Progress Notes (Signed)
Location:  [facility] Room Number: 315A Place of Service:  SNF [phone]) Provider:  Mahlon Gammon, MD   Mahlon Gammon, MD  Patient Care Team: Mahlon Gammon, MD as PCP - General (Internal Medicine) York Spaniel, MD (Inactive) as Consulting Physician (Neurology)  Extended Emergency Contact Information Primary Emergency Contact: [facility] Home Phone: [phone] Mobile Phone: [phone] Relation: Relative Secondary Emergency Contact: wright,[employer] Mobile Phone: [phone] Relation: Daughter  Code Status:  DNR Goals of care: Advanced Directive information    03/25/2023   11:20 AM  Advanced Directives  Does Patient Have a Medical Advance Directive? Yes  Type of Advance Directive Out of facility DNR (pink MOST or yellow form);Healthcare Power of Attorney  Does patient want to make changes to medical advance directive? No - Patient declined  Copy of Healthcare Power of Attorney in Chart? Yes - validated most recent copy scanned in chart (See row information)     Chief Complaint  Patient presents with   Medical Management of Chronic Issues    Patient is being seen for a routine visit    Health Maintenance    Patient is due for Tetanus vaccine and AWV after 03/31/2023    HPI:  Pt is a 89 y.o. female seen today for medical management of chronic diseases.    Lives in Memory unit in [place]   Has h/o Alzheimer Dementia, h/o Seizure disorder and Osteoarthritis and Hypertension H/o Peripheral Neuropathy  Patient continues to be stable. Can  do her transfers but need Contact Guard for walking with her walker No pain   No new Nursing issues. No Behavior issues Her weight is stable No Falls Wt Readings from Last 3 Encounters:  03/25/23 133 lb (60.3 kg)  02/19/23 127 lb 12.8 oz (58 kg)  01/15/23 128 lb 9.6 oz (58.3 kg)    Past Medical History:  Diagnosis Date   Arthritis    Per [facility] new patient packet    Epilepsy    Per  [facility] new patient packet    Glaucoma    Hearing deficit    Hearing aids   High blood pressure    Per [facility] new patient packet    History of abnormal weight loss    Low back pain    Memory difficulty 05/09/2016   MGUS (monoclonal gammopathy of unknown significance)    Neuropathy associated with MGUS 11/09/2015   Radiculopathy of sacral and sacrococcygeal region    S1   Seizures    Past Surgical History:  Procedure Laterality Date   APPENDECTOMY     BACK SURGERY     CATARACT EXTRACTION Left    OVARY SURGERY     PARATHYROIDECTOMY  9/   TRABECULECTOMY Left     Allergies  Allergen Reactions   Hctz [Hydrochlorothiazide]    Other Other (See Comments)    Caused sodium to drop    Outpatient Encounter Medications as of 03/25/2023  Medication Sig   acetaminophen (TYLENOL) 325 MG tablet Take 650 mg by mouth every 8 (eight) hours.   alendronate (FOSAMAX) 70 MG tablet Take 70 mg by mouth once a week. Take with a full glass of water on an empty stomach. Friday   bimatoprost (LUMIGAN) 0.01 % SOLN Place 1 drop into both eyes at bedtime.   brimonidine (ALPHAGAN) 0.2 % ophthalmic solution Place 1 drop into the right eye 3 (three) times daily. Wait 10 minutes between drops. (Keep eyes closed for 2 minutes after each drop)  chlorhexidine (PERIDEX) 0.12 % solution Use as directed 5 mLs in the mouth or throat at bedtime. 1 tsp   Cholecalciferol (VITAMIN D3) 50 MCG (2000 UT) TABS Take by mouth.   cyanocobalamin 1000 MCG tablet Take 1,000 mcg by mouth in the morning.   diclofenac Sodium (VOLTAREN) 1 % GEL Apply 4 g topically as needed (Apply to left hip prn for pain or legft thumb pain).   docusate sodium (COLACE) 100 MG capsule Take 100 mg by mouth daily.   donepezil (ARICEPT) 10 MG tablet Take 10 mg by mouth at bedtime.   dorzolamide-timolol (COSOPT) 22.3-6.8 MG/ML ophthalmic solution Place 1 drop into the right eye 2 (two) times daily. Wait 10 minutes between drops. (Keep eyes closed for 2 minutes  after each drop)   lamoTRIgine (LAMICTAL) 100 MG tablet Take 1 tablet in morning, 1 and 1/2 tablets at night   memantine (NAMENDA) 5 MG tablet Take 10 mg by mouth 2 (two) times daily.   Multiple Vitamin (MULTIVITAMIN) tablet Take 1 tablet by mouth daily.   pilocarpine (PILOCAR) 4 % ophthalmic solution Place 1 drop into the right eye 4 (four) times daily. Wait 10 minutes between drops. (Keep eyes closed for 2 minutes after each drop)   RHOPRESSA 0.02 % SOLN Apply 1 drop to eye every evening.   SPIKEVAX syringe Inject 0.5 mLs into the muscle once.   No facility-administered encounter medications on file as of 03/25/2023.    Review of Systems  Unable to perform ROS: Dementia    Immunization History  Administered Date(s) Administered   Covid-19, Mrna,Vaccine(Spikevax)85yrs and older 10/08/2022   Influenza, High Dose Seasonal PF 09/26/2015, 09/03/2017, 08/28/2018, 09/23/2020, 09/07/2022   Influenza,inj,Quad PF,6+ Mos 10/01/2019   Influenza-Unspecified 09/22/2009, 08/27/2011, 10/08/2012, 12/08/2014, 02/10/2018, 09/06/2021   Moderna Covid-19 Vaccine Bivalent Booster 61yrs & up 04/19/2022   Moderna Sars-Covid-2 Vaccination 12/14/2019, 01/12/2020, 09/20/2020, 10/13/2020, 10/08/2022   Pneumococcal Conjugate-13 12/31/2013   Pneumococcal Polysaccharide-23 12/04/1999, 02/02/2016   Td 12/04/2000   Tdap 11/07/2012   Zoster Recombinat (Shingrix) 04/01/2022, 06/01/2022   Pertinent  Health Maintenance Due  Topic Date Due   INFLUENZA VACCINE  07/04/2023   DEXA SCAN  Completed      03/30/2022    9:35 AM 10/12/2022    3:54 AM 11/20/2022    2:03 PM 12/11/2022   11:10 AM 02/19/2023    2:40 PM  Fall Risk  Falls in the past year? 0  0 0 1  Was there an injury with Fall? 0  0 0 0  Fall Risk Category Calculator 0  0 0 1  Fall Risk Category (Retired) Low  Low Low   (RETIRED) Patient Fall Risk Level Low fall risk High fall risk Low fall risk Low fall risk   Patient at Risk for Falls Due to    No Fall  Risks History of fall(s);Impaired balance/gait;Impaired mobility  Fall risk Follow up   Falls evaluation completed Falls evaluation completed Falls evaluation completed;Education provided;Falls prevention discussed   Functional Status Survey:    Vitals:   03/25/23 1115  BP: 119/66  Pulse: 86  Resp: 16  Temp: 98.1 F (36.7 C)  TempSrc: Temporal  SpO2: 95%  Weight: 133 lb (60.3 kg)  Height: 5\' 5"  (1.651 m)   Body mass index is 22.13 kg/m. Physical Exam Vitals reviewed.  Constitutional:      Appearance: Normal appearance.  HENT:     Head: Normocephalic.     Nose: Nose normal.     Mouth/Throat:  Mouth: Mucous membranes are moist.     Pharynx: Oropharynx is clear.  Eyes:     Pupils: Pupils are equal, round, and reactive to light.  Cardiovascular:     Rate and Rhythm: Normal rate and regular rhythm.     Pulses: Normal pulses.     Heart sounds: Normal heart sounds. No murmur heard. Pulmonary:     Effort: Pulmonary effort is normal.     Breath sounds: Normal breath sounds.  Abdominal:     General: Abdomen is flat. Bowel sounds are normal.     Palpations: Abdomen is soft.  Musculoskeletal:        General: No swelling.     Cervical back: Neck supple.  Skin:    General: Skin is warm.  Neurological:     General: No focal deficit present.     Mental Status: She is alert.  Psychiatric:        Mood and Affect: Mood normal.        Thought Content: Thought content normal.     Labs reviewed: Recent Labs    06/12/22 0000 10/12/22 0923  NA 142 140  K 4.1 3.7  CL 105 107  CO2 24* 25  GLUCOSE  --  111*  BUN 13 13  CREATININE 0.1* 0.81  CALCIUM 8.9 8.5*   No results for input(s): "AST", "ALT", "ALKPHOS", "BILITOT", "PROT", "ALBUMIN" in the last 8760 hours. Recent Labs    06/12/22 0000 10/12/22 0923  WBC 5.4 9.5  HGB 12.8 12.0  HCT 34* 36.2  MCV  --  96.0  PLT 171 142*   Lab Results  Component Value Date   TSH 1.91 11/21/2021   No results found for:  "HGBA1C" Lab Results  Component Value Date   CHOL 241 (A) 09/10/2019   HDL 99 (A) 09/10/2019   LDLCALC 128 09/10/2019   TRIG 72 09/10/2019    Significant Diagnostic Results in last 30 days:  No results found.  Assessment/Plan 1. Senile osteoporosis On Fosamax since 05/22 T score -5.2 No Prolia due to cost 2. 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 Mixed Alzheimer's and vascular dementia Doing well in SNF Aricept and Namenda 4. Multiple thyroid nodules No Biopsy due to goals of care  5. Essential hypertension Off all meds  6. Vitamin B 12 deficiency On B12       Family/ staff Communication:   Labs/tests ordered:

## 2023-04-08 ENCOUNTER — Encounter: Payer: Self-pay | Admitting: Adult Health

## 2023-04-08 ENCOUNTER — Non-Acute Institutional Stay (SKILLED_NURSING_FACILITY): Payer: Medicare Other | Admitting: Adult Health

## 2023-04-08 DIAGNOSIS — M19042 Primary osteoarthritis, left hand: Secondary | ICD-10-CM | POA: Diagnosis not present

## 2023-04-08 DIAGNOSIS — M7989 Other specified soft tissue disorders: Secondary | ICD-10-CM | POA: Diagnosis not present

## 2023-04-08 DIAGNOSIS — M85842 Other specified disorders of bone density and structure, left hand: Secondary | ICD-10-CM | POA: Diagnosis not present

## 2023-04-08 NOTE — Progress Notes (Signed)
Location:  [facility] Room Number: 315A Place of Service:  SNF [phone]) Provider:  Tamsen Roers, MD  Patient Care Team: Mahlon Gammon, MD as PCP - General (Internal Medicine) York Spaniel, MD (Inactive) as Consulting Physician (Neurology)  Extended Emergency Contact Information Primary Emergency Contact: [facility] Home Phone: [phone] Mobile Phone: [phone] Relation: Relative Secondary Emergency Contact: wright,[employer] Mobile Phone: [phone] Relation: Daughter  Code Status:  DNR Goals of care: Advanced Directive information    04/08/2023    9:41 AM  Advanced Directives  Does Patient Have a Medical Advance Directive? Yes  Type of Advance Directive Out of facility DNR (pink MOST or yellow form);Healthcare Power of Attorney  Does patient want to make changes to medical advance directive? No - Patient declined  Copy of Healthcare Power of Attorney in Chart? Yes - validated most recent copy scanned in chart (See row information)     Chief Complaint  Patient presents with   Acute Visit    Patient is being seen for acute finger swelling    Health Maintenance    Patient is due for AWV and Tetanus vaccine    HPI:  Pt is a 89 y.o. female seen today for an acute visit for finger swelling.   The nurse reports she had an unwitnessed fall one day ago 5/5.  She was alert and sitting on the floor. Due to the resident's dementia she can not elaborate on the hx. She reports left finger swelling and pain. No numbness. Has reduced range of motion. No other injuries. Vitals stable.   Past Medical History:  Diagnosis Date   Arthritis    Per [facility] new patient packet    Epilepsy [facility])    Per [facility] new patient packet    Glaucoma    Hearing deficit    Hearing aids   High blood pressure    Per [facility] new patient packet    History of abnormal weight loss    Low back pain    Memory difficulty 05/09/2016   MGUS  (monoclonal gammopathy of unknown significance)    Neuropathy associated with MGUS (HCC) 11/09/2015   Radiculopathy of sacral and sacrococcygeal region    S1   Seizures (HCC)    Past Surgical History:  Procedure Laterality Date   APPENDECTOMY     BACK SURGERY     CATARACT EXTRACTION Left    OVARY SURGERY     PARATHYROIDECTOMY  9/   TRABECULECTOMY Left     Allergies  Allergen Reactions   Hctz [Hydrochlorothiazide]    Other Other (See Comments)    Caused sodium to drop    Outpatient Encounter Medications as of 04/08/2023  Medication Sig   acetaminophen (TYLENOL) 325 MG tablet Take 650 mg by mouth every 8 (eight) hours.   alendronate (FOSAMAX) 70 MG tablet Take 70 mg by mouth once a week. Take with a full glass of water on an empty stomach. Friday   bimatoprost (LUMIGAN) 0.01 % SOLN Place 1 drop into both eyes at bedtime.   brimonidine (ALPHAGAN) 0.2 % ophthalmic solution Place 1 drop into the right eye 3 (three) times daily. Wait 10 minutes between drops. (Keep eyes closed for 2 minutes after each drop)   chlorhexidine (PERIDEX) 0.12 % solution Use as directed 5 mLs in the mouth or throat at bedtime. 1 tsp   Cholecalciferol (VITAMIN D3) 50 MCG (2000 UT) TABS Take by mouth.   cyanocobalamin 1000 MCG tablet Take 1,000  mcg by mouth in the morning.   diclofenac Sodium (VOLTAREN) 1 % GEL Apply 4 g topically as needed (Apply to left hip prn for pain or legft thumb pain).   docusate sodium (COLACE) 100 MG capsule Take 100 mg by mouth daily.   donepezil (ARICEPT) 10 MG tablet Take 10 mg by mouth at bedtime.   dorzolamide-timolol (COSOPT) 22.3-6.8 MG/ML ophthalmic solution Place 1 drop into the right eye 2 (two) times daily. Wait 10 minutes between drops. (Keep eyes closed for 2 minutes after each drop)   lamoTRIgine (LAMICTAL) 100 MG tablet Take 1 tablet in morning, 1 and 1/2 tablets at night   memantine (NAMENDA) 5 MG tablet Take 10 mg by mouth 2 (two) times daily.   Multiple Vitamin  (MULTIVITAMIN) tablet Take 1 tablet by mouth daily.   pilocarpine (PILOCAR) 4 % ophthalmic solution Place 1 drop into the right eye 4 (four) times daily. Wait 10 minutes between drops. (Keep eyes closed for 2 minutes after each drop)   RHOPRESSA 0.02 % SOLN Apply 1 drop to eye every evening.   No facility-administered encounter medications on file as of 04/08/2023.    Review of Systems  Constitutional:  Negative for activity change, appetite change, chills, diaphoresis, fatigue, fever and unexpected weight change.  HENT:  Negative for congestion.   Respiratory:  Negative for cough, shortness of breath and wheezing.   Cardiovascular:  Negative for chest pain, palpitations and leg swelling.  Gastrointestinal:  Negative for abdominal distention, abdominal pain, constipation and diarrhea.  Genitourinary:  Negative for difficulty urinating and dysuria.  Musculoskeletal:  Positive for arthralgias and joint swelling. Negative for back pain, gait problem and myalgias.  Neurological:  Negative for dizziness, tremors, seizures, syncope, facial asymmetry, speech difficulty, weakness, light-headedness, numbness and headaches.  Psychiatric/Behavioral:  Positive for confusion. Negative for agitation and behavioral problems.     Immunization History  Administered Date(s) Administered   Covid-19, Mrna,Vaccine(Spikevax)78yrs and older 10/08/2022   Influenza, High Dose Seasonal PF 09/26/2015, 09/03/2017, 08/28/2018, 09/23/2020, 09/07/2022   Influenza,inj,Quad PF,6+ Mos 10/01/2019   Influenza-Unspecified 09/22/2009, 08/27/2011, 10/08/2012, 12/08/2014, 02/10/2018, 09/06/2021   Moderna Covid-19 Vaccine Bivalent Booster 68yrs & up 04/19/2022   Moderna Sars-Covid-2 Vaccination 12/14/2019, 01/12/2020, 09/20/2020, 10/13/2020, 10/08/2022   Pneumococcal Conjugate-13 12/31/2013   Pneumococcal Polysaccharide-23 12/04/1999, 02/02/2016   Td 12/04/2000   Tdap 11/07/2012, 01/18/2023   Zoster Recombinat (Shingrix)  04/01/2022, 06/01/2022   Pertinent  Health Maintenance Due  Topic Date Due   INFLUENZA VACCINE  07/04/2023   DEXA SCAN  Completed      03/30/2022    9:35 AM 10/12/2022    3:54 AM 11/20/2022    2:03 PM 12/11/2022   11:10 AM 02/19/2023    2:40 PM  Fall Risk  Falls in the past year? 0  0 0 1  Was there an injury with Fall? 0  0 0 0  Fall Risk Category Calculator 0  0 0 1  Fall Risk Category (Retired) Low  Low Low   (RETIRED) Patient Fall Risk Level Low fall risk High fall risk Low fall risk Low fall risk   Patient at Risk for Falls Due to    No Fall Risks History of fall(s);Impaired balance/gait;Impaired mobility  Fall risk Follow up   Falls evaluation completed Falls evaluation completed Falls evaluation completed;Education provided;Falls prevention discussed   Functional Status Survey:    Vitals:   04/08/23 0934  BP: (!) 142/80  Pulse: 82  Resp: 18  Temp: (!) 97 F (36.1 C)  TempSrc: Temporal  SpO2: 96%  Weight: 132 lb 6.4 oz (60.1 kg)  Height: 5\' 5"  (1.651 m)   Body mass index is 22.03 kg/m. Physical Exam Vitals and nursing note reviewed.  Constitutional:      General: She is not in acute distress.    Appearance: She is not diaphoretic.  HENT:     Head: Normocephalic and atraumatic.  Neck:     Vascular: No JVD.  Cardiovascular:     Rate and Rhythm: Normal rate and regular rhythm.     Heart sounds: No murmur heard. Pulmonary:     Effort: Pulmonary effort is normal. No respiratory distress.     Breath sounds: Normal breath sounds. No wheezing.  Musculoskeletal:        General: Swelling (left 4th digit with bruising,), tenderness (Left 4th DIP) and signs of injury present.     Comments: +CMS to LUE  Skin:    General: Skin is warm and dry.  Neurological:     Mental Status: She is alert and oriented to person, place, and time.     Labs reviewed: Recent Labs    06/12/22 0000 10/12/22 0923  NA 142 140  K 4.1 3.7  CL 105 107  CO2 24* 25  GLUCOSE  --  111*   BUN 13 13  CREATININE 0.1* 0.81  CALCIUM 8.9 8.5*   No results for input(s): "AST", "ALT", "ALKPHOS", "BILITOT", "PROT", "ALBUMIN" in the last 8760 hours. Recent Labs    06/12/22 0000 10/12/22 0923  WBC 5.4 9.5  HGB 12.8 12.0  HCT 34* 36.2  MCV  --  96.0  PLT 171 142*   Lab Results  Component Value Date   TSH 1.91 11/21/2021   No results found for: "HGBA1C" Lab Results  Component Value Date   CHOL 241 (A) 09/10/2019   HDL 99 (A) 09/10/2019   LDLCALC 128 09/10/2019   TRIG 72 09/10/2019    Significant Diagnostic Results in last 30 days:  No results found.  Assessment/Plan  1. Finger swelling Xray 2 view left 4th digit and hand Tylenol prn Ice 15 min tid x 48 hr   Family/ staff Communication: nurse  Labs/tests ordered:  xray left hand

## 2023-04-10 ENCOUNTER — Encounter: Payer: Self-pay | Admitting: Adult Health

## 2023-04-11 ENCOUNTER — Non-Acute Institutional Stay (SKILLED_NURSING_FACILITY): Payer: Medicare Other | Admitting: Adult Health

## 2023-04-11 ENCOUNTER — Encounter: Payer: Self-pay | Admitting: Adult Health

## 2023-04-11 DIAGNOSIS — Z0000 Encounter for general adult medical examination without abnormal findings: Secondary | ICD-10-CM | POA: Diagnosis not present

## 2023-04-11 NOTE — Patient Instructions (Signed)
Ms. Tracy Nielsen , Thank you for taking time to come for your Medicare Wellness Visit. I appreciate your ongoing commitment to your health goals. Please review the following plan we discussed and let me know if I can assist you in the future.   Screening recommendations/referrals: Colonoscopy aged out Mammogram aged out Bone Density holding off Recommended yearly ophthalmology/optometry visit for glaucoma screening and checkup Recommended yearly dental visit for hygiene and checkup  Vaccinations: Influenza vaccine- due annually in September/October Pneumococcal vaccine up to date Tdap vaccine up to date Shingles vaccine up to date    Advanced directives: reviewed  Conditions/risks identified: fall  Next appointment: 1 year   Preventive Care 35 Years and Older, Female Preventive care refers to lifestyle choices and visits with your health care provider that can promote health and wellness. What does preventive care include? A yearly physical exam. This is also called an annual well check. Dental exams once or twice a year. Routine eye exams. Ask your health care provider how often you should have your eyes checked. Personal lifestyle choices, including: Daily care of your teeth and gums. Regular physical activity. Eating a healthy diet. Avoiding tobacco and drug use. Limiting alcohol use. Practicing safe sex. Taking low-dose aspirin every day. Taking vitamin and mineral supplements as recommended by your health care provider. What happens during an annual well check? The services and screenings done by your health care provider during your annual well check will depend on your age, overall health, lifestyle risk factors, and family history of disease. Counseling  Your health care provider may ask you questions about your: Alcohol use. Tobacco use. Drug use. Emotional well-being. Home and relationship well-being. Sexual activity. Eating habits. History of falls. Memory and  ability to understand (cognition). Work and work Astronomer. Reproductive health. Screening  You may have the following tests or measurements: Height, weight, and BMI. Blood pressure. Lipid and cholesterol levels. These may be checked every 5 years, or more frequently if you are over 45 years old. Skin check. Lung cancer screening. You may have this screening every year starting at age 59 if you have a 30-pack-year history of smoking and currently smoke or have quit within the past 15 years. Fecal occult blood test (FOBT) of the stool. You may have this test every year starting at age 39. Flexible sigmoidoscopy or colonoscopy. You may have a sigmoidoscopy every 5 years or a colonoscopy every 10 years starting at age 53. Hepatitis C blood test. Hepatitis B blood test. Sexually transmitted disease (STD) testing. Diabetes screening. This is done by checking your blood sugar (glucose) after you have not eaten for a while (fasting). You may have this done every 1-3 years. Bone density scan. This is done to screen for osteoporosis. You may have this done starting at age 27. Mammogram. This may be done every 1-2 years. Talk to your health care provider about how often you should have regular mammograms. Talk with your health care provider about your test results, treatment options, and if necessary, the need for more tests. Vaccines  Your health care provider may recommend certain vaccines, such as: Influenza vaccine. This is recommended every year. Tetanus, diphtheria, and acellular pertussis (Tdap, Td) vaccine. You may need a Td booster every 10 years. Zoster vaccine. You may need this after age 80. Pneumococcal 13-valent conjugate (PCV13) vaccine. One dose is recommended after age 23. Pneumococcal polysaccharide (PPSV23) vaccine. One dose is recommended after age 63. Talk to your health care provider about which screenings and  vaccines you need and how often you need them. This information is  not intended to replace advice given to you by your health care provider. Make sure you discuss any questions you have with your health care provider. Document Released: 12/16/2015 Document Revised: 08/08/2016 Document Reviewed: 09/20/2015 Elsevier Interactive Patient Education  2017 [place] Prevention in the Home Falls can cause injuries. They can happen to people of all ages. There are many things you can do to make your home safe and to help prevent falls. What can I do on the outside of my home? Regularly fix the edges of walkways and driveways and fix any cracks. Remove anything that might make you trip as you walk through a door, such as a raised step or threshold. Trim any bushes or trees on the path to your home. Use bright outdoor lighting. Clear any walking paths of anything that might make someone trip, such as rocks or tools. Regularly check to see if handrails are loose or broken. Make sure that both sides of any steps have handrails. Any raised decks and porches should have guardrails on the edges. Have any leaves, snow, or ice cleared regularly. Use sand or salt on walking paths during winter. Clean up any spills in your garage right away. This includes oil or grease spills. What can I do in the bathroom? Use night lights. Install grab bars by the toilet and in the tub and shower. Do not use towel bars as grab bars. Use non-skid mats or decals in the tub or shower. If you need to sit down in the shower, use a plastic, non-slip stool. Keep the floor dry. Clean up any water that spills on the floor as soon as it happens. Remove soap buildup in the tub or shower regularly. Attach bath mats securely with double-sided non-slip rug tape. Do not have throw rugs and other things on the floor that can make you trip. What can I do in the bedroom? Use night lights. Make sure that you have a light by your bed that is easy to reach. Do not use any sheets or blankets that  are too big for your bed. They should not hang down onto the floor. Have a firm chair that has side arms. You can use this for support while you get dressed. Do not have throw rugs and other things on the floor that can make you trip. What can I do in the kitchen? Clean up any spills right away. Avoid walking on wet floors. Keep items that you use a lot in easy-to-reach places. If you need to reach something above you, use a strong step stool that has a grab bar. Keep electrical cords out of the way. Do not use floor polish or wax that makes floors slippery. If you must use wax, use non-skid floor wax. Do not have throw rugs and other things on the floor that can make you trip. What can I do with my stairs? Do not leave any items on the stairs. Make sure that there are handrails on both sides of the stairs and use them. Fix handrails that are broken or loose. Make sure that handrails are as long as the stairways. Check any carpeting to make sure that it is firmly attached to the stairs. Fix any carpet that is loose or worn. Avoid having throw rugs at the top or bottom of the stairs. If you do have throw rugs, attach them to the floor with carpet tape. Make  sure that you have a light switch at the top of the stairs and the bottom of the stairs. If you do not have them, ask someone to add them for you. What else can I do to help prevent falls? Wear shoes that: Do not have high heels. Have rubber bottoms. Are comfortable and fit you well. Are closed at the toe. Do not wear sandals. If you use a stepladder: Make sure that it is fully opened. Do not climb a closed stepladder. Make sure that both sides of the stepladder are locked into place. Ask someone to hold it for you, if possible. Clearly mark and make sure that you can see: Any grab bars or handrails. First and last steps. Where the edge of each step is. Use tools that help you move around (mobility aids) if they are needed. These  include: Canes. Walkers. Scooters. Crutches. Turn on the lights when you go into a dark area. Replace any light bulbs as soon as they burn out. Set up your furniture so you have a clear path. Avoid moving your furniture around. If any of your floors are uneven, fix them. If there are any pets around you, be aware of where they are. Review your medicines with your doctor. Some medicines can make you feel dizzy. This can increase your chance of falling. Ask your doctor what other things that you can do to help prevent falls. This information is not intended to replace advice given to you by your health care provider. Make sure you discuss any questions you have with your health care provider. Document Released: 09/15/2009 Document Revised: 04/26/2016 Document Reviewed: 12/24/2014 Elsevier Interactive Patient Education  2017 [employer].

## 2023-04-11 NOTE — Progress Notes (Signed)
Subjective:   Tracy Nielsen is a 89 y.o. female who presents for Medicare Annual (Subsequent) preventive examination at [facility].   Review of Systems     Cardiac Risk Factors include: advanced age (>68men, >86 women);sedentary lifestyle     Objective:    Today's Vitals   04/11/23 1123  BP: 120/69  Pulse: 90  Resp: 17  Temp: 97.8 F (36.6 C)  TempSrc: Temporal  SpO2: 96%  Weight: 132 lb 6.4 oz (60.1 kg)  Height: 5\' 5"  (1.651 m)   Body mass index is 22.03 kg/m.     04/11/2023   11:43 AM 04/08/2023    9:41 AM 03/25/2023   11:20 AM 01/15/2023    1:55 PM 12/11/2022   11:12 AM 11/20/2022    2:04 PM 11/05/2022   10:43 AM  Advanced Directives  Does Patient Have a Medical Advance Directive? Yes Yes Yes Yes  Yes Yes  Type of Advance Directive Out of facility DNR (pink MOST or yellow form);Living will;Healthcare Power of Attorney Out of facility DNR (pink MOST or yellow form);Healthcare Power of [employer] of facility DNR (pink MOST or yellow form);Healthcare Power of [employer] of [place];Out of facility DNR (pink MOST or yellow form) Healthcare Power of [place];Out of facility DNR (pink MOST or yellow form) Healthcare Power of [place];Out of facility DNR (pink MOST or yellow form) Healthcare Power of [place];Out of facility DNR (pink MOST or yellow form)  Does patient want to make changes to medical advance directive?  No - Patient declined No - Patient declined No - Patient declined No - Patient declined No - Patient declined No - Patient declined  Copy of Healthcare Power of Attorney in Chart? Yes - validated most recent copy scanned in chart (See row information) Yes - validated most recent copy scanned in chart (See row information) Yes - validated most recent copy scanned in chart (See row information) Yes - validated most recent copy scanned in chart (See row information) Yes - validated most recent copy scanned in  chart (See row information) Yes - validated most recent copy scanned in chart (See row information) Yes - validated most recent copy scanned in chart (See row information)  Pre-existing out of facility DNR order (yellow form or pink MOST form) Yellow form placed in chart (order not valid for inpatient use);Pink MOST form placed in chart (order not valid for inpatient use)   Yellow form placed in chart (order not valid for inpatient use)  Yellow form placed in chart (order not valid for inpatient use) Yellow form placed in chart (order not valid for inpatient use)    Current Medications (verified) Outpatient Encounter Medications as of 04/11/2023  Medication Sig   acetaminophen (TYLENOL) 325 MG tablet Take 650 mg by mouth every 8 (eight) hours.   alendronate (FOSAMAX) 70 MG tablet Take 70 mg by mouth once a week. Take with a full glass of water on an empty stomach. Friday   bimatoprost (LUMIGAN) 0.01 % SOLN Place 1 drop into both eyes at bedtime.   brimonidine (ALPHAGAN) 0.2 % ophthalmic solution Place 1 drop into the right eye 3 (three) times daily. Wait 10 minutes between drops. (Keep eyes closed for 2 minutes after each drop)   chlorhexidine (PERIDEX) 0.12 % solution Use as directed 5 mLs in the mouth or throat at bedtime. 1 tsp   Cholecalciferol (VITAMIN D3) 50 MCG (2000 UT) TABS Take by mouth.   cyanocobalamin 1000 MCG tablet  Take 1,000 mcg by mouth in the morning.   diclofenac Sodium (VOLTAREN) 1 % GEL Apply 4 g topically as needed (Apply to left hip prn for pain or legft thumb pain).   docusate sodium (COLACE) 100 MG capsule Take 100 mg by mouth daily.   donepezil (ARICEPT) 10 MG tablet Take 10 mg by mouth at bedtime.   dorzolamide-timolol (COSOPT) 22.3-6.8 MG/ML ophthalmic solution Place 1 drop into the right eye 2 (two) times daily. Wait 10 minutes between drops. (Keep eyes closed for 2 minutes after each drop)   lamoTRIgine (LAMICTAL) 100 MG tablet Take 1 tablet in morning, 1 and 1/2 tablets  at night   memantine (NAMENDA) 5 MG tablet Take 10 mg by mouth 2 (two) times daily.   Multiple Vitamin (MULTIVITAMIN) tablet Take 1 tablet by mouth daily.   pilocarpine (PILOCAR) 4 % ophthalmic solution Place 1 drop into the right eye 4 (four) times daily. Wait 10 minutes between drops. (Keep eyes closed for 2 minutes after each drop)   RHOPRESSA 0.02 % SOLN Apply 1 drop to eye every evening.   No facility-administered encounter medications on file as of 04/11/2023.    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   Radiculopathy of sacral and sacrococcygeal region    S1   Seizures (HCC)    Past Surgical History:  Procedure Laterality Date   APPENDECTOMY     BACK SURGERY     CATARACT EXTRACTION Left    OVARY SURGERY     PARATHYROIDECTOMY  9/   TRABECULECTOMY Left    Family History  Problem Relation Age of Onset   Congestive Heart Failure Mother    Parkinsonism Father    Heart attack Father    Colon cancer Sister    Multiple sclerosis Daughter    Muscular dystrophy Grandchild    Muscular dystrophy Grandchild    Social History   Socioeconomic History   Marital status: Widowed    Spouse name: Not on file   Number of children: 4   Years of education: 14   Highest education level: Not on file  Occupational History   Occupation: Retired  Tobacco Use   Smoking status: Former    Packs/day: 0.50    Years: 25.00    Additional pack years: 0.00    Total pack years: 12.50    Types: Cigarettes    Quit date: 12/03/1973    Years since  quitting: 49.3   Smokeless tobacco: Never  Vaping Use   Vaping Use: Never used  Substance and Sexual Activity   Alcohol use: Yes    Comment: Consumes 2 glasses of wine per day   Drug use: No   Sexual activity: Not Currently  Other Topics Concern   Not on file  Social History Narrative   Lives in memory care      Patient is right handed.       Per [employer] New Patient  Packet, Abstracted 08/26/2019   Diet:Normal      Caffeine:Yes      Married, if yes what year: Widow 37 (div 30), remarried 1988      Do you live in a house, apartment, assisted living, condo, trailer, ect: memory care      Pets: No      Current/Past profession: Nature conservation officer, organize affairs for elderly      Exercise: no      Living Will: Yes   DNR: No, would like to discuss one--did discuss on 10/9 and DNR form completed, copy made for vynca, and original placed in paper chart; HCPOA, [name], also present and agreeable with his mother-in-law decision      POA/HPOA: Yes      Functional Status:   Do you have difficulty bathing or dressing yourself? yes   Do you have difficulty preparing food or eating? Prep yes, eating yes   Do you have difficulty managing your medications? yes   Do you have difficulty managing your finances? yess   Do you have difficulty affording your medications?  no   Social Determinants of Health   Financial Resource Strain: Not on file  Food Insecurity: No Food Insecurity (04/11/2023)   Hunger Vital Sign    Worried About Running Out of Food in the Last Year: Never true    Ran Out of Food in the Last Year: Never true  Transportation Needs: Not on file  Physical Activity: Not on file  Stress: Not on file  Social Connections: Not on file    Tobacco Counseling Counseling given: Not Answered   Clinical Intake:  Pre-visit preparation completed: No  Pain : No/denies pain     BMI - recorded: 22.03 Nutritional Status: BMI of 19-24  Normal Nutritional  Risks: None Diabetes: No  How often do you need to have someone help you when you read instructions, pamphlets, or other written materials from your doctor or pharmacy?: 3 - Sometimes What is the last grade level you completed in school?: College  Diabetic?no  Interpreter Needed?: No  Information entered by :: Fletcher Anon NP   Activities of Daily Living    04/11/2023   11:35 AM  In your present state of health, do you have any difficulty performing the following activities:  Hearing? 1  Vision? 1  Difficulty concentrating or making decisions? 1  Walking or climbing stairs? 1  Dressing or bathing? 1  Doing errands, shopping? 1  Preparing Food and eating ? Y  Using the Toilet? Y  In the past six months, have you accidently leaked urine? Y  Do you have problems with loss of bowel control? Y  Managing your Medications? Y  Managing your Finances? Y  Housekeeping or managing your Housekeeping? Y    Patient Care Team: Mahlon Gammon, MD as PCP - General (Internal Medicine) York Spaniel, MD (Inactive) as Consulting Physician (Neurology)  Indicate any recent Medical Services you may have received from other than [facility] providers in the past year (date may be approximate).     Assessment:   This is a routine wellness examination for [place].  Hearing/Vision screen No results found.  Dietary issues and exercise activities discussed: Current Exercise Habits: The patient does not participate in regular exercise at present, Exercise limited by: orthopedic condition(s)   Goals Addressed             This Visit's Progress    Behavior Symptoms Management       Evidence-based  guidance:  Assess for behavior changes by interviewing patient and family/caregiver (informant) using a standardized tool when possible.  Assess for signs/symptoms of underlying condition such as urinary tract infection, unmanaged pain or side effects of pharmacologic therapy when new or worsening  agitation appears.  Identify triggers or exacerbating factors such as environmental changes, medication, depression or psychosis.  If behavior is dangerous to patient or family/caregiver and cannot be controlled, consider pharmacologic management, professional in-home care or hospitalization.  Prepare patient and family/caregiver for time-limited pharmacologic therapy that is prescribed only when behavior symptoms are severe and after careful assessment of the risks, benefits and type of dementia.  Monitor efficacy of pharmacologic therapy that may include antipsychotic for aggression, psychosis and agitation or SSRI (selective serotonin reuptake inhibitor) for mood or depressive symptoms.  When pharmacologic therapy has been tapered, assess symptoms monthly for at least 4 months after discontinuation to identify recurrence of symptoms.  Use nonpharmacologic approaches to manage behavior including referral to mental health provider for behavior therapy and psychoeducation for patient and caregiver.  Encourage use of complementary therapy directed at behavior and mood management such as aromatherapy, muscle-relaxation training, massage or therapeutic touch, animal-assisted therapy and music therapy.  When pain is present, mutually develop an interdisciplinary and multimodal pain management plan with patient and family/caregiver; track the patient's response to the pain management plan.  Initiate individualized nonpharmacologic pain management measures that may include physical rehabilitation, cognitive behavior therapy, guided imagery, massage, distraction, yoga, relaxation or chiropractic manipulation.  Encourage use of local anesthetic or analgesic therapy as an adjunct for pain control such as lidocaine patch, capsaicin cream or topical nonsteroidal anti-inflammatory agent.  Prepare patient for use of pharmacologic therapy in a stepped approach that may include acetaminophen, nonsteroidal  anti-inflammatory, opioid, antiepileptic or antidepressant.  Review efficacy, tolerability and adherence of pharmacologic therapy; manage medication-induced side effects.   Notes:        Depression Screen    04/11/2023   11:33 AM 02/19/2023    2:41 PM 12/11/2022   11:10 AM 11/20/2022    2:04 PM 03/30/2022    9:35 AM 02/10/2021   10:59 AM 11/09/2020   10:16 AM  PHQ 2/9 Scores  PHQ - 2 Score 0 0 0 0 0 0 0    Fall Risk    04/11/2023   11:32 AM 02/19/2023    2:40 PM 12/11/2022   11:10 AM 11/20/2022    2:03 PM 03/30/2022    9:35 AM  Fall Risk   Falls in the past year? 1 1 0 0 0  Number falls in past yr: 0 0 0 0 0  Injury with Fall? 1 0 0 0 0  Risk for fall due to : History of fall(s) History of fall(s);Impaired balance/gait;Impaired mobility No Fall Risks    Follow up Falls evaluation completed Falls evaluation completed;Education provided;Falls prevention discussed Falls evaluation completed Falls evaluation completed     FALL RISK PREVENTION PERTAINING TO THE HOME:  Any stairs in or around the home? No  If so, are there any without handrails? No  Home free of loose throw rugs in walkways, pet beds, electrical cords, etc? Yes  Adequate lighting in your home to reduce risk of falls? Yes   ASSISTIVE DEVICES UTILIZED TO PREVENT FALLS:  Life alert? No  Use of a cane, walker or w/c? Yes  Grab bars in the bathroom? Yes  Shower chair or bench in shower? Yes  Elevated toilet seat or a handicapped toilet? Yes  TIMED UP AND GO:  Was the test performed? No .  Length of time to ambulate 10 feet: not steady sec.   Gait unsteady without use of assistive device, provider informed and interventions were implemented  Cognitive Function:    03/30/2022    9:37 AM 02/07/2022   11:16 AM 12/01/2019    9:58 AM 01/14/2018    9:30 AM 01/09/2017    9:40 AM  MMSE - Mini Mental State Exam  Orientation to time 1 4 2 4 4   Orientation to Place 5 4 5 5 5   Registration 3 3 3 3 3   Attention/ Calculation  0 5 5 5 5   Recall 0 1 0 1 1  Language- name 2 objects 2 2 2 2 2   Language- repeat 1 1 1 1 1   Language- follow 3 step command 3 3 3 3 3   Language- read & follow direction 1 1 1 1 1   Write a sentence 1 1 1 1 1   Copy design 1 1 1 1 1   Total score 18 26 24 27 27         04/11/2023   11:28 AM 02/10/2021   11:02 AM  6CIT Screen  What Year? 4 points 0 points  What month? 3 points 0 points  What time? 3 points 0 points  Count back from 20 2 points 0 points  Months in reverse 4 points 4 points  Repeat phrase 10 points 10 points  Total Score 26 points 14 points    Immunizations Immunization History  Administered Date(s) Administered   Covid-19, Mrna,Vaccine(Spikevax)24yrs and older 10/08/2022   Influenza, High Dose Seasonal PF 09/26/2015, 09/03/2017, 08/28/2018, 09/23/2020, 09/07/2022   Influenza,inj,Quad PF,6+ Mos 10/01/2019   Influenza-Unspecified 09/22/2009, 08/27/2011, 10/08/2012, 12/08/2014, 02/10/2018, 09/06/2021   Moderna Covid-19 Vaccine Bivalent Booster 14yrs & up 04/19/2022   Moderna Sars-Covid-2 Vaccination 12/14/2019, 01/12/2020, 09/20/2020, 10/13/2020, 10/08/2022   Pneumococcal Conjugate-13 12/31/2013   Pneumococcal Polysaccharide-23 12/04/1999, 02/02/2016   Td 12/04/2000   Tdap 11/07/2012, 01/18/2023   Zoster Recombinat (Shingrix) 04/01/2022, 06/01/2022    TDAP status: Up to date  Flu Vaccine status: Up to date  Pneumococcal vaccine status: Up to date  Covid-19 vaccine status: Completed vaccines  Qualifies for Shingles Vaccine? Yes   Zostavax completed No   Shingrix Completed?: Yes  Screening Tests Health Maintenance  Topic Date Due   COVID-19 Vaccine (7 - 2023-24 season) 08/02/2023 (Originally 12/03/2022)   INFLUENZA VACCINE  07/04/2023   Medicare Annual Wellness (AWV)  04/10/2024   DTaP/Tdap/Td (4 - Td or Tdap) 01/18/2033   Pneumonia Vaccine 35+ Years old  Completed   DEXA SCAN  Completed   Zoster Vaccines- Shingrix  Completed   HPV VACCINES  Aged Out     Health Maintenance  There are no preventive care reminders to display for this patient.   Colorectal cancer screening: No longer required.   Mammogram status: No longer required due to age.  Bone Density status: Completed 11/29/20. Results reflect: Bone density results: OSTEOPOROSIS. Repeat every 2 years.  Lung Cancer Screening: (Low Dose CT Chest recommended if Age 34-80 years, 30 pack-year currently smoking OR have quit w/in 15years.) does not qualify.   Lung Cancer Screening Referral: na  Additional Screening:  Hepatitis C Screening: does not qualify; Completed na  Vision Screening: Recommended annual ophthalmology exams for early detection of glaucoma and other disorders of the eye. Is the patient up to date with their annual eye exam?  Yes  Who is the provider or what is  the name of the office in which the patient attends annual eye exams? [facility] If pt is not established with a provider, would they like to be referred to a provider to establish care? No .   Dental Screening: Recommended annual dental exams for proper oral hygiene  Community Resource Referral / Chronic Care Management: CRR required this visit?  No   CCM required this visit?  No      Plan:     I have personally reviewed and noted the following in the patient's chart:   Medical and social history Use of alcohol, tobacco or illicit drugs  Current medications and supplements including opioid prescriptions. Patient is not currently taking opioid prescriptions. Functional ability and status Nutritional status Physical activity Advanced directives List of other physicians Hospitalizations, surgeries, and ER visits in previous 12 months Vitals Screenings to include cognitive, depression, and falls Referrals and appointments  In addition, I have reviewed and discussed with patient certain preventive protocols, quality metrics, and best practice recommendations. A written personalized care plan for  preventive services as well as general preventive health recommendations were provided to patient.     Rudi Heap, [place]   04/11/2023   Nurse Notes: na

## 2023-04-15 DIAGNOSIS — R2681 Unsteadiness on feet: Secondary | ICD-10-CM | POA: Diagnosis not present

## 2023-04-15 DIAGNOSIS — R2689 Other abnormalities of gait and mobility: Secondary | ICD-10-CM | POA: Diagnosis not present

## 2023-04-15 DIAGNOSIS — M6259 Muscle wasting and atrophy, not elsewhere classified, multiple sites: Secondary | ICD-10-CM | POA: Diagnosis not present

## 2023-04-15 DIAGNOSIS — R296 Repeated falls: Secondary | ICD-10-CM | POA: Diagnosis not present

## 2023-04-16 DIAGNOSIS — M25542 Pain in joints of left hand: Secondary | ICD-10-CM | POA: Diagnosis not present

## 2023-04-16 DIAGNOSIS — G308 Other Alzheimer's disease: Secondary | ICD-10-CM | POA: Diagnosis not present

## 2023-04-16 DIAGNOSIS — M6389 Disorders of muscle in diseases classified elsewhere, multiple sites: Secondary | ICD-10-CM | POA: Diagnosis not present

## 2023-04-16 DIAGNOSIS — R296 Repeated falls: Secondary | ICD-10-CM | POA: Diagnosis not present

## 2023-04-16 DIAGNOSIS — R278 Other lack of coordination: Secondary | ICD-10-CM | POA: Diagnosis not present

## 2023-04-17 DIAGNOSIS — R278 Other lack of coordination: Secondary | ICD-10-CM | POA: Diagnosis not present

## 2023-04-17 DIAGNOSIS — M25542 Pain in joints of left hand: Secondary | ICD-10-CM | POA: Diagnosis not present

## 2023-04-17 DIAGNOSIS — M6389 Disorders of muscle in diseases classified elsewhere, multiple sites: Secondary | ICD-10-CM | POA: Diagnosis not present

## 2023-04-17 DIAGNOSIS — G308 Other Alzheimer's disease: Secondary | ICD-10-CM | POA: Diagnosis not present

## 2023-04-17 DIAGNOSIS — R296 Repeated falls: Secondary | ICD-10-CM | POA: Diagnosis not present

## 2023-04-18 DIAGNOSIS — R296 Repeated falls: Secondary | ICD-10-CM | POA: Diagnosis not present

## 2023-04-18 DIAGNOSIS — R278 Other lack of coordination: Secondary | ICD-10-CM | POA: Diagnosis not present

## 2023-04-18 DIAGNOSIS — M6389 Disorders of muscle in diseases classified elsewhere, multiple sites: Secondary | ICD-10-CM | POA: Diagnosis not present

## 2023-04-18 DIAGNOSIS — G308 Other Alzheimer's disease: Secondary | ICD-10-CM | POA: Diagnosis not present

## 2023-04-18 DIAGNOSIS — M25542 Pain in joints of left hand: Secondary | ICD-10-CM | POA: Diagnosis not present

## 2023-04-19 ENCOUNTER — Encounter: Payer: Self-pay | Admitting: Adult Health

## 2023-04-19 ENCOUNTER — Non-Acute Institutional Stay (SKILLED_NURSING_FACILITY): Payer: Medicare Other | Admitting: Adult Health

## 2023-04-19 DIAGNOSIS — R569 Unspecified convulsions: Secondary | ICD-10-CM

## 2023-04-19 DIAGNOSIS — K5901 Slow transit constipation: Secondary | ICD-10-CM | POA: Diagnosis not present

## 2023-04-19 MED ORDER — POLYETHYLENE GLYCOL 3350 17 G PO PACK: 17.0000 g | PACK | ORAL | 0 refills | Status: DC

## 2023-04-19 NOTE — Progress Notes (Signed)
Location:  [facility] Room Number: 315A Place of Service:  SNF [id number]) Provider:  Tamsen Roers, MD  Patient Care Team: Mahlon Gammon, MD as PCP - General (Internal Medicine) York Spaniel, MD (Inactive) as Consulting Physician (Neurology)  Extended Emergency Contact Information Primary Emergency Contact: [facility] Home Phone: [phone] Mobile Phone: [phone] Relation: Relative Secondary Emergency Contact: wright,Kashvi Mobile Phone: [phone] Relation: Daughter  Code Status:  DNR Goals of care: Advanced Directive information    04/19/2023   10:43 AM  Advanced Directives  Does Patient Have a Medical Advance Directive? Yes  Type of Advance Directive Out of facility DNR (pink MOST or yellow form);Living will;Healthcare Power of Attorney  Does patient want to make changes to medical advance directive? No - Patient declined  Copy of Healthcare Power of Attorney in Chart? Yes - validated most recent copy scanned in chart (See row information)  Pre-existing out of facility DNR order (yellow form or pink MOST form) Yellow form placed in chart (order not valid for inpatient use);Pink MOST form placed in chart (order not valid for inpatient use)     Chief Complaint  Patient presents with   Acute Visit    Patient is being seen for a seizure    HPI:  Pt is a 89 y.o. female seen today for an acute visit for seizure. Nurse reports this am Tracy Nielsen was walking back from breakfast on the memory care unit and became pale and diaphoretic stating that she did not feel well. She became weak once in her room and started off in to space while sitting in the chair with a slight twitch to the left arm. She has a hx of seizure disorder and is on lamictal. Her son states she had one "large" seizure years ago and has been on the med ever since. She did have one similar episode one month ago after an episode of nausea and  vomiting. She is now back to baseline with no memory of the above event. She has been having some intermittent constipation going 3-4 days in between BMs periodically. BP during the event today was soft 92/57.  EKG obtained 04/19/23 showing sinus rhythm, and left anterior hemi block No significant ST abnormalities  Pt is not having any cp or sob. No change in vision or headache. No dizziness.    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   alendronate (FOSAMAX) 70 MG tablet Take 70 mg by mouth once a week. Take with a full glass of water on an empty stomach. Friday   bimatoprost (LUMIGAN) 0.01 % SOLN Place 1 drop into both eyes at bedtime.   brimonidine (ALPHAGAN) 0.2 % ophthalmic solution Place 1 drop into the right eye 3 (three) times daily. Wait 10 minutes between drops. (Keep eyes closed for 2 minutes after each drop)   chlorhexidine (PERIDEX) 0.12 % solution Use as directed 5 mLs in the mouth or throat at bedtime. 1 tsp   Cholecalciferol (VITAMIN D3) 50 MCG (2000 UT) TABS Take by  mouth.   cyanocobalamin 1000 MCG tablet Take 1,000 mcg by mouth in the morning.   diclofenac Sodium (VOLTAREN) 1 % GEL Apply 4 g topically as needed (Apply to left hip prn for pain or legft thumb pain).   docusate sodium (COLACE) 100 MG capsule Take 100 mg by mouth daily.   donepezil (ARICEPT) 10 MG tablet Take 10 mg by mouth at bedtime.   dorzolamide-timolol (COSOPT) 22.3-6.8 MG/ML ophthalmic solution Place 1 drop into the right eye 2 (two) times daily. Wait 10 minutes between drops. (Keep eyes closed for 2 minutes after each drop)   lamoTRIgine (LAMICTAL) 100 MG tablet Take 1 tablet in morning, 1 and 1/2 tablets at night   memantine (NAMENDA) 5 MG tablet Take 10 mg by mouth 2 (two) times daily.   Multiple Vitamin (MULTIVITAMIN) tablet Take 1 tablet by mouth daily.   pilocarpine (PILOCAR) 4 % ophthalmic solution Place 1 drop into the right eye 4 (four) times daily. Wait 10 minutes between drops. (Keep eyes closed for 2 minutes after each drop)   RHOPRESSA 0.02 % SOLN Apply 1 drop to eye every evening.   No facility-administered encounter medications on file as of 04/19/2023.    Review of Systems  Constitutional:  Positive for diaphoresis. Negative for activity change, appetite change, chills, fatigue, fever and unexpected weight change.  HENT:  Negative for congestion.   Respiratory:  Negative for cough, shortness of breath and wheezing.   Cardiovascular:  Negative for chest pain, palpitations and leg swelling.  Gastrointestinal:  Positive for constipation. Negative for abdominal distention, abdominal pain and diarrhea.  Genitourinary:  Negative for difficulty urinating and dysuria.  Musculoskeletal:  Positive for gait problem. Negative for arthralgias, back pain, joint swelling and myalgias.  Neurological:  Positive for seizures and weakness. Negative for dizziness, tremors, syncope, facial asymmetry, speech difficulty, light-headedness, numbness and headaches.  Psychiatric/Behavioral:   Positive for confusion. Negative for agitation and behavioral problems.     Immunization History  Administered Date(s) Administered   Covid-19, Mrna,Vaccine(Spikevax)90yrs and older 10/08/2022   Influenza, High Dose Seasonal PF 09/26/2015, 09/03/2017, 08/28/2018, 09/23/2020, 09/07/2022   Influenza,inj,Quad PF,6+ Mos 10/01/2019   Influenza-Unspecified 09/22/2009, 08/27/2011, 10/08/2012, 12/08/2014, 02/10/2018, 09/06/2021   Moderna Covid-19 Vaccine Bivalent Booster 72yrs & up 04/19/2022   Moderna Sars-Covid-2 Vaccination 12/14/2019, 01/12/2020, 09/20/2020, 10/13/2020, 10/08/2022   Pneumococcal Conjugate-13 12/31/2013   Pneumococcal Polysaccharide-23 12/04/1999, 02/02/2016   Td 12/04/2000   Tdap 11/07/2012, 01/18/2023   Zoster Recombinat (Shingrix) 04/01/2022, 06/01/2022   Pertinent  Health Maintenance Due  Topic Date Due   INFLUENZA VACCINE  07/04/2023   DEXA SCAN  Completed      10/12/2022    3:54 AM 11/20/2022    2:03 PM 12/11/2022   11:10 AM 02/19/2023    2:40 PM 04/11/2023  11:32 AM  Fall Risk  Falls in the past year?  0 0 1 1  Was there an injury with Fall?  0 0 0 1  Fall Risk Category Calculator  0 0 1 2  Fall Risk Category (Retired)  Low Low    (RETIRED) Patient Fall Risk Level High fall risk Low fall risk Low fall risk    Patient at Risk for Falls Due to   No Fall Risks History of fall(s);Impaired balance/gait;Impaired mobility History of fall(s)  Fall risk Follow up  Falls evaluation completed Falls evaluation completed Falls evaluation completed;Education provided;Falls prevention discussed Falls evaluation completed   Functional Status Survey:    Vitals:   04/19/23 1031 04/19/23 1119  BP: (!) 162/77 (!) 92/57  Pulse: 74   Resp: 18   Temp: 97.7 F (36.5 C)   TempSrc: Temporal   SpO2: 98%   Weight: 132 lb 6.4 oz (60.1 kg)   Height: 5\' 5"  (1.651 m)    Body mass index is 22.03 kg/m. Physical Exam Vitals and nursing note reviewed.  Constitutional:       General: She is not in acute distress.    Appearance: She is not diaphoretic.  HENT:     Head: Normocephalic and atraumatic.  Eyes:     Extraocular Movements: Extraocular movements intact.     Conjunctiva/sclera: Conjunctivae normal.     Pupils: Pupils are equal, round, and reactive to light.  Neck:     Vascular: No JVD.  Cardiovascular:     Rate and Rhythm: Normal rate and regular rhythm.     Heart sounds: No murmur heard. Pulmonary:     Effort: Pulmonary effort is normal. No respiratory distress.     Breath sounds: Normal breath sounds. No wheezing.  Abdominal:     General: Bowel sounds are normal. There is no distension.     Palpations: Abdomen is soft.     Tenderness: There is no abdominal tenderness.  Musculoskeletal:        General: No swelling, tenderness, deformity or signs of injury.     Right lower leg: No edema.     Left lower leg: No edema.  Skin:    General: Skin is warm and dry.  Neurological:     General: No focal deficit present.     Mental Status: She is alert. Mental status is at baseline.     Cranial Nerves: No cranial nerve deficit.     Gait: Gait normal.  Psychiatric:        Mood and Affect: Mood normal.     Labs reviewed: Recent Labs    06/12/22 0000 10/12/22 0923  NA 142 140  K 4.1 3.7  CL 105 107  CO2 24* 25  GLUCOSE  --  111*  BUN 13 13  CREATININE 0.1* 0.81  CALCIUM 8.9 8.5*   No results for input(s): "AST", "ALT", "ALKPHOS", "BILITOT", "PROT", "ALBUMIN" in the last 8760 hours. Recent Labs    06/12/22 0000 10/12/22 0923  WBC 5.4 9.5  HGB 12.8 12.0  HCT 34* 36.2  MCV  --  96.0  PLT 171 142*   Lab Results  Component Value Date   TSH 1.91 11/21/2021   No results found for: "HGBA1C" Lab Results  Component Value Date   CHOL 241 (A) 09/10/2019   HDL 99 (A) 09/10/2019   LDLCALC 128 09/10/2019   TRIG 72 09/10/2019    Significant Diagnostic Results in last 30 days:  No results found.  Assessment/Plan  1. Seizure  (HCC) Two episodes one month apart seem to be associated with having a bowel movement or meal Possible vagal mediated Will add miralax Checked EKG does have left anterior hemi block but other wise in SR rate in the 80s Will treat constipation, check labs If this re occurs consider going up on lamictal  2. Slow transit constipation Add miralax qod   Family/ staff Communication: discussed with son Trinna Post   Labs/tests ordered:  CBC CMP EKG

## 2023-04-21 ENCOUNTER — Telehealth: Payer: Self-pay | Admitting: Family

## 2023-04-21 ENCOUNTER — Emergency Department (HOSPITAL_COMMUNITY)
Admission: EM | Admit: 2023-04-21 | Discharge: 2023-04-22 | Disposition: A | Discharge status: Home / Self Care | Payer: Medicare Other | Attending: Emergency Medicine | Admitting: Emergency Medicine

## 2023-04-21 ENCOUNTER — Other Ambulatory Visit: Payer: Self-pay

## 2023-04-21 DIAGNOSIS — R6889 Other general symptoms and signs: Secondary | ICD-10-CM | POA: Diagnosis not present

## 2023-04-21 DIAGNOSIS — W19XXXA Unspecified fall, initial encounter: Secondary | ICD-10-CM

## 2023-04-21 DIAGNOSIS — W1830XA Fall on same level, unspecified, initial encounter: Secondary | ICD-10-CM | POA: Insufficient documentation

## 2023-04-21 DIAGNOSIS — Z79899 Other long term (current) drug therapy: Secondary | ICD-10-CM | POA: Diagnosis not present

## 2023-04-21 DIAGNOSIS — I10 Essential (primary) hypertension: Secondary | ICD-10-CM | POA: Insufficient documentation

## 2023-04-21 DIAGNOSIS — S0083XA Contusion of other part of head, initial encounter: Secondary | ICD-10-CM | POA: Diagnosis not present

## 2023-04-21 DIAGNOSIS — Y92129 Unspecified place in nursing home as the place of occurrence of the external cause: Secondary | ICD-10-CM | POA: Diagnosis not present

## 2023-04-21 DIAGNOSIS — S0003XA Contusion of scalp, initial encounter: Secondary | ICD-10-CM | POA: Diagnosis not present

## 2023-04-21 DIAGNOSIS — S199XXA Unspecified injury of neck, initial encounter: Secondary | ICD-10-CM | POA: Diagnosis not present

## 2023-04-21 DIAGNOSIS — S0181XA Laceration without foreign body of other part of head, initial encounter: Secondary | ICD-10-CM | POA: Diagnosis not present

## 2023-04-21 DIAGNOSIS — Z743 Need for continuous supervision: Secondary | ICD-10-CM | POA: Diagnosis not present

## 2023-04-21 DIAGNOSIS — S0990XA Unspecified injury of head, initial encounter: Secondary | ICD-10-CM | POA: Diagnosis present

## 2023-04-21 NOTE — Telephone Encounter (Signed)
[facility] facility nurse called states patient sustained a fall has open laceration to forehead and hematoma.Also complains of left hip pain.No LOC  Advised to send to ED for evaluation of hip pain and head laceration.

## 2023-04-21 NOTE — ED Triage Notes (Signed)
Pt arrived from [facility] care after a unwitnessed fall. Pt was ambulating using walker and ran into the door, head hitting the door handle. Small hematoma and lac to pts head. Pt denies pain. No blood thinners

## 2023-04-22 ENCOUNTER — Non-Acute Institutional Stay (SKILLED_NURSING_FACILITY): Payer: Medicare Other | Admitting: Adult Health

## 2023-04-22 ENCOUNTER — Emergency Department (HOSPITAL_COMMUNITY): Payer: Medicare Other

## 2023-04-22 ENCOUNTER — Encounter: Payer: Self-pay | Admitting: Adult Health

## 2023-04-22 DIAGNOSIS — W19XXXA Unspecified fall, initial encounter: Secondary | ICD-10-CM | POA: Diagnosis not present

## 2023-04-22 DIAGNOSIS — S0003XA Contusion of scalp, initial encounter: Secondary | ICD-10-CM | POA: Diagnosis not present

## 2023-04-22 DIAGNOSIS — M6389 Disorders of muscle in diseases classified elsewhere, multiple sites: Secondary | ICD-10-CM | POA: Diagnosis not present

## 2023-04-22 DIAGNOSIS — M25562 Pain in left knee: Secondary | ICD-10-CM

## 2023-04-22 DIAGNOSIS — S199XXA Unspecified injury of neck, initial encounter: Secondary | ICD-10-CM | POA: Diagnosis not present

## 2023-04-22 DIAGNOSIS — M25542 Pain in joints of left hand: Secondary | ICD-10-CM | POA: Diagnosis not present

## 2023-04-22 DIAGNOSIS — R296 Repeated falls: Secondary | ICD-10-CM | POA: Diagnosis not present

## 2023-04-22 DIAGNOSIS — G308 Other Alzheimer's disease: Secondary | ICD-10-CM | POA: Diagnosis not present

## 2023-04-22 DIAGNOSIS — S0083XD Contusion of other part of head, subsequent encounter: Secondary | ICD-10-CM | POA: Diagnosis not present

## 2023-04-22 DIAGNOSIS — S0181XD Laceration without foreign body of other part of head, subsequent encounter: Secondary | ICD-10-CM

## 2023-04-22 DIAGNOSIS — R278 Other lack of coordination: Secondary | ICD-10-CM | POA: Diagnosis not present

## 2023-04-22 MED ORDER — LIDOCAINE-EPINEPHRINE-TETRACAINE (LET) TOPICAL GEL
3.0000 mL | Freq: Once | TOPICAL | Status: AC
  Administered 2023-04-22: 3 mL via TOPICAL
  Filled 2023-04-22: qty 3

## 2023-04-22 NOTE — Progress Notes (Signed)
Location:  [facility] of Service:  SNF (31) Provider:  Tamsen Roers, MD  Patient Care Team: Mahlon Gammon, MD as PCP - General (Internal Medicine) York Spaniel, MD (Inactive) as Consulting Physician (Neurology)  Extended Emergency Contact Information Primary Emergency Contact: [facility] Home Phone: [phone] Mobile Phone: [phone] Relation: Relative Secondary Emergency Contact: wright,Arria Mobile Phone: [phone] Relation: Daughter  Code Status:  DNR Goals of care: Advanced Directive information    04/22/2023   11:22 AM  Advanced Directives  Does Patient Have a Medical Advance Directive? Yes  Type of Advance Directive Out of facility DNR (pink MOST or yellow form);Living will;Healthcare Power of Attorney  Does patient want to make changes to medical advance directive? No - Patient declined  Copy of Healthcare Power of Attorney in Chart? Yes - validated most recent copy scanned in chart (See row information)  Pre-existing out of facility DNR order (yellow form or pink MOST form) Yellow form placed in chart (order not valid for inpatient use);Pink MOST form placed in chart (order not valid for inpatient use)     Chief Complaint  Patient presents with  . Acute Visit    Patient is being seen for a fall.    HPI:  Pt is a 89 y.o. female seen today for an acute visit s/p ER visit and a fall  Ms. Kuri resides in the memory care setting. She had an unwitnessed fall on 5/19 and was found on the floor supine alert. She had hit her face and suffered a forehead contusion and laceration. She was seen in the ER. CT of the head and neck did not reveal an acute intracranial bleed or neck injury. She had adhesive glue applied to her forehead. She denies any dizziness or headache but does report some left knee pain . She tends to get up on her own and forget to call for help.   Of note she felt lightheaded  after a meal and had small seizure on 04/19/23.   She also had a vaso vagal episode a month ago after an episode of nausea or vomiting.    EKG obtained 04/19/23 showing sinus rhythm, 1st degree AV block and left anterior hemi block No significant ST abnormalities  CBC CMP 04/19/23 were WNL  Past Medical History:  Diagnosis Date  . Arthritis    Per [facility] new patient packet   . Epilepsy (HCC)    Per [facility] new patient packet   . Glaucoma   . Hearing deficit    Hearing aids  . High blood pressure    Per [facility] new patient packet   . History of abnormal weight loss   . Low back pain   . Memory difficulty 05/09/2016  . MGUS (monoclonal gammopathy of unknown significance)   . 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 Radiculopathy of sacral and sacrococcygeal region    S1  . Seizures (HCC)    Past Surgical History:  Procedure Laterality Date  . APPENDECTOMY    . BACK SURGERY    . CATARACT EXTRACTION Left   . OVARY SURGERY    . PARATHYROIDECTOMY  9/  . TRABECULECTOMY Left     Allergies  Allergen Reactions  . Hctz [Hydrochlorothiazide]   . Other Other (See Comments)    Caused sodium to drop    Outpatient Encounter Medications as of 04/22/2023  Medication Sig  . acetaminophen (TYLENOL) 325 MG tablet Take 650 mg by mouth every  8 (eight) hours.  Marland Kitchen alendronate (FOSAMAX) 70 MG tablet Take 70 mg by mouth once a week. Take with a full glass of water on an empty stomach. Friday  . bimatoprost (LUMIGAN) 0.01 % SOLN Place 1 drop into both eyes at bedtime.  . brimonidine (ALPHAGAN) 0.2 % ophthalmic solution Place 1 drop into the right eye 3 (three) times daily. Wait 10 minutes between drops. (Keep eyes closed for 2 minutes after each drop)  . chlorhexidine (PERIDEX) 0.12 % solution Use as directed 5 mLs in the mouth or throat at bedtime. 1 tsp  . Cholecalciferol (VITAMIN D3) 50 MCG (2000 UT) TABS Take by mouth.  . cyanocobalamin 1000 MCG tablet Take 1,000 mcg by mouth in the morning.  .  diclofenac Sodium (VOLTAREN) 1 % GEL Apply 4 g topically as needed (Apply to left hip prn for pain or legft thumb pain).  Marland Kitchen docusate sodium (COLACE) 100 MG capsule Take 100 mg by mouth daily.  Marland Kitchen donepezil (ARICEPT) 10 MG tablet Take 10 mg by mouth at bedtime.  . dorzolamide-timolol (COSOPT) 22.3-6.8 MG/ML ophthalmic solution Place 1 drop into the right eye 2 (two) times daily. Wait 10 minutes between drops. (Keep eyes closed for 2 minutes after each drop)  . lamoTRIgine (LAMICTAL) 100 MG tablet Take 1 tablet in morning, 1 and 1/2 tablets at night  . memantine (NAMENDA) 5 MG tablet Take 10 mg by mouth 2 (two) times daily.  . Multiple Vitamin (MULTIVITAMIN) tablet Take 1 tablet by mouth daily.  . pilocarpine (PILOCAR) 4 % ophthalmic solution Place 1 drop into the right eye 4 (four) times daily. Wait 10 minutes between drops. (Keep eyes closed for 2 minutes after each drop)  . polyethylene glycol (MIRALAX) 17 g packet Take 17 g by mouth every other day.  . RHOPRESSA 0.02 % SOLN Apply 1 drop to eye every evening.   No facility-administered encounter medications on file as of 04/22/2023.    Review of Systems  Constitutional:  Negative for activity change, appetite change, chills, diaphoresis, fatigue, fever and unexpected weight change.  HENT:  Negative for congestion.   Respiratory:  Negative for cough, shortness of breath and wheezing.   Cardiovascular:  Negative for chest pain, palpitations and leg swelling.  Gastrointestinal:  Negative for abdominal distention, abdominal pain, constipation and diarrhea.  Genitourinary:  Negative for difficulty urinating and dysuria.  Musculoskeletal:  Positive for arthralgias and gait problem. Negative for back pain, joint swelling and myalgias.  Neurological:  Negative for dizziness, tremors, seizures, syncope, facial asymmetry, speech difficulty, weakness, light-headedness, numbness and headaches.  Psychiatric/Behavioral:  Positive for confusion. Negative for  agitation and behavioral problems.     Immunization History  Administered Date(s) Administered  . Covid-19, Mrna,Vaccine(Spikevax)68yrs and older 10/08/2022  . Influenza, High Dose Seasonal PF 09/26/2015, 09/03/2017, 08/28/2018, 09/23/2020, 09/07/2022  . Influenza,inj,Quad PF,6+ Mos 10/01/2019  . Influenza-Unspecified 09/22/2009, 08/27/2011, 10/08/2012, 12/08/2014, 02/10/2018, 09/06/2021  . Moderna Covid-19 Vaccine Bivalent Booster 65yrs & up 04/19/2022  . Moderna Sars-Covid-2 Vaccination 12/14/2019, 01/12/2020, 09/20/2020, 10/13/2020, 10/08/2022  . Pneumococcal Conjugate-13 12/31/2013  . Pneumococcal Polysaccharide-23 12/04/1999, 02/02/2016  . Td 12/04/2000  . Tdap 11/07/2012, 01/18/2023  . Zoster Recombinat (Shingrix) 04/01/2022, 06/01/2022   Pertinent  Health Maintenance Due  Topic Date Due  . INFLUENZA VACCINE  07/04/2023  . DEXA SCAN  Completed      10/12/2022    3:54 AM 11/20/2022    2:03 PM 12/11/2022   11:10 AM 02/19/2023    2:40 PM 04/11/2023  11:32 AM  Fall Risk  Falls in the past year?  0 0 1 1  Was there an injury with Fall?  0 0 0 1  Fall Risk Category Calculator  0 0 1 2  Fall Risk Category (Retired)  Low Low    (RETIRED) Patient Fall Risk Level High fall risk Low fall risk Low fall risk    Patient at Risk for Falls Due to   No Fall Risks History of fall(s);Impaired balance/gait;Impaired mobility History of fall(s)  Fall risk Follow up  Falls evaluation completed Falls evaluation completed Falls evaluation completed;Education provided;Falls prevention discussed Falls evaluation completed   Functional Status Survey:    Vitals:   04/22/23 1120  BP: 132/76  Pulse: 88  Resp: 18  Temp: (!) 97 F (36.1 C)  TempSrc: Temporal  SpO2: 97%  Weight: 132 lb 6.4 oz (60.1 kg)  Height: 5\' 5"  (1.651 m)   Body mass index is 22.03 kg/m. Physical Exam Vitals and nursing note reviewed.  Constitutional:      General: She is not in acute distress.    Appearance: She is  not diaphoretic.  HENT:     Nose: Nose normal.     Mouth/Throat:     Mouth: Mucous membranes are moist.     Pharynx: Oropharynx is clear.  Eyes:     General:        Right eye: No discharge.        Left eye: No discharge.     Extraocular Movements: Extraocular movements intact.     Conjunctiva/sclera: Conjunctivae normal.     Comments: Right pupil 2mm and does not react (chronic). Left pupil 3-31mm and reactive  Neck:     Vascular: No JVD.  Cardiovascular:     Rate and Rhythm: Normal rate and regular rhythm.     Heart sounds: No murmur heard. Pulmonary:     Effort: Pulmonary effort is normal. No respiratory distress.     Breath sounds: Normal breath sounds. No wheezing.  Skin:    General: Skin is warm and dry.  Neurological:     General: No focal deficit present.     Mental Status: She is alert. Mental status is at baseline.     Cranial Nerves: No cranial nerve deficit.    Labs reviewed: Recent Labs    06/12/22 0000 10/12/22 0923  NA 142 140  K 4.1 3.7  CL 105 107  CO2 24* 25  GLUCOSE  --  111*  BUN 13 13  CREATININE 0.1* 0.81  CALCIUM 8.9 8.5*   No results for input(s): "AST", "ALT", "ALKPHOS", "BILITOT", "PROT", "ALBUMIN" in the last 8760 hours. Recent Labs    06/12/22 0000 10/12/22 0923  WBC 5.4 9.5  HGB 12.8 12.0  HCT 34* 36.2  MCV  --  96.0  PLT 171 142*   Lab Results  Component Value Date   TSH 1.91 11/21/2021   No results found for: "HGBA1C" Lab Results  Component Value Date   CHOL 241 (A) 09/10/2019   HDL 99 (A) 09/10/2019   LDLCALC 128 09/10/2019   TRIG 72 09/10/2019    Significant Diagnostic Results in last 30 days:  CT Head Wo Contrast  Result Date: 04/22/2023 CLINICAL DATA:  Head and neck trauma, moderate to severe EXAM: CT HEAD WITHOUT CONTRAST CT CERVICAL SPINE WITHOUT CONTRAST TECHNIQUE: Multidetector CT imaging of the head and cervical spine was performed following the standard protocol without intravenous contrast. Multiplanar CT  image reconstructions of the cervical  spine were also generated. RADIATION DOSE REDUCTION: This exam was performed according to the departmental dose-optimization program which includes automated exposure control, adjustment of the mA and/or kV according to patient size and/or use of iterative reconstruction technique. COMPARISON:  CT head and cervical spine 10/12/2022 FINDINGS: CT HEAD FINDINGS Brain: No intracranial hemorrhage, mass effect, or evidence of acute infarct. No hydrocephalus. No extra-axial fluid collection. Generalized cerebral atrophy. Ill-defined hypoattenuation within the cerebral white matter is nonspecific but consistent with chronic small vessel ischemic disease. Vascular: No hyperdense vessel. Intracranial arterial calcification. Skull: No fracture or focal lesion.  Left forehead scalp hematoma. Sinuses/Orbits: No acute finding. Paranasal sinuses and mastoid air cells are well aerated. Other: None. CT CERVICAL SPINE FINDINGS Alignment: No evidence of traumatic malalignment. Skull base and vertebrae: No acute fracture. No primary bone lesion or focal pathologic process. Soft tissues and spinal canal: No prevertebral fluid or swelling. No visible canal hematoma. Disc levels: Multilevel spondylosis, disc space height loss, degenerative endplate changes greatest at C4-C5, C5-C6, and C6-C7 where it is advanced. Multilevel facet arthropathy. Advanced neural foraminal narrowing on the left at C4-C5, on the left at C5-C6 secondary to uncovertebral spurring and facet arthropathy. No severe spinal canal narrowing. Upper chest: Negative. Other: None. IMPRESSION: 1. No acute intracranial abnormality. Generalized atrophy and small vessel white matter disease. 2. No acute fracture in the cervical spine. Multilevel degenerative spondylosis. 3. Left forehead scalp hematoma.  No calvarial fracture. Electronically Signed   By: Minerva Fester M.D.   On: 04/22/2023 01:27   CT Cervical Spine Wo  Contrast  Result Date: 04/22/2023 CLINICAL DATA:  Head and neck trauma, moderate to severe EXAM: CT HEAD WITHOUT CONTRAST CT CERVICAL SPINE WITHOUT CONTRAST TECHNIQUE: Multidetector CT imaging of the head and cervical spine was performed following the standard protocol without intravenous contrast. Multiplanar CT image reconstructions of the cervical spine were also generated. RADIATION DOSE REDUCTION: This exam was performed according to the departmental dose-optimization program which includes automated exposure control, adjustment of the mA and/or kV according to patient size and/or use of iterative reconstruction technique. COMPARISON:  CT head and cervical spine 10/12/2022 FINDINGS: CT HEAD FINDINGS Brain: No intracranial hemorrhage, mass effect, or evidence of acute infarct. No hydrocephalus. No extra-axial fluid collection. Generalized cerebral atrophy. Ill-defined hypoattenuation within the cerebral white matter is nonspecific but consistent with chronic small vessel ischemic disease. Vascular: No hyperdense vessel. Intracranial arterial calcification. Skull: No fracture or focal lesion.  Left forehead scalp hematoma. Sinuses/Orbits: No acute finding. Paranasal sinuses and mastoid air cells are well aerated. Other: None. CT CERVICAL SPINE FINDINGS Alignment: No evidence of traumatic malalignment. Skull base and vertebrae: No acute fracture. No primary bone lesion or focal pathologic process. Soft tissues and spinal canal: No prevertebral fluid or swelling. No visible canal hematoma. Disc levels: Multilevel spondylosis, disc space height loss, degenerative endplate changes greatest at C4-C5, C5-C6, and C6-C7 where it is advanced. Multilevel facet arthropathy. Advanced neural foraminal narrowing on the left at C4-C5, on the left at C5-C6 secondary to uncovertebral spurring and facet arthropathy. No severe spinal canal narrowing. Upper chest: Negative. Other: None. IMPRESSION: 1. No acute intracranial  abnormality. Generalized atrophy and small vessel white matter disease. 2. No acute fracture in the cervical spine. Multilevel degenerative spondylosis. 3. Left forehead scalp hematoma.  No calvarial fracture. Electronically Signed   By: Minerva Fester M.D.   On: 04/22/2023 01:27    Assessment/Plan  1. Contusion of forehead, subsequent encounter ***  2. Laceration of forehead,  subsequent encounter ***  3. Acute pain of left knee Left knee xray 04/22/2023 did not show a fracture but did show chondrocalcinosis. She can use tylenol for pain, report if worsening.  d  Family/ staff Communication: ***  Labs/tests ordered:  ***

## 2023-04-22 NOTE — ED Notes (Signed)
Pts family member at the bedside and will be transporting pt back to the memory care unit at [facility]

## 2023-04-22 NOTE — ED Provider Notes (Signed)
[place] EMERGENCY DEPARTMENT AT [facility] Provider Note  CSN: [id number] Arrival date & time: 04/21/23 2242  Chief Complaint(s) Fall ED Triage Notes Gunnar Fusi, RN (Registered Nurse)  Nursing  Date of Service: 04/21/2023 10:50 PM  Signed Pt arrived from [facility] care after a unwitnessed fall. Pt was ambulating using walker and ran into the door, head hitting the door handle. Small hematoma and lac to pts head. Pt denies pain. No blood thinners      HPI LARISHA FERRING is a 89 y.o. female here after fall at [facility] with obvious head injury. Small lac to the forehead.  Patient is pleasantly demented and does not remember fall or reason why she is here.  She has no physical complaints.  No anticoagulation.  The history is provided by the EMS personnel.    Past Medical History Past Medical History:  Diagnosis Date   Arthritis    Per [facility] new patient packet    Epilepsy [facility])    Per [facility] new patient packet    Glaucoma    Hearing deficit    Hearing aids   High blood pressure    Per [facility] new patient packet    History of abnormal weight loss    Low back pain    Memory difficulty 05/09/2016   MGUS (monoclonal gammopathy of unknown significance)    Neuropathy associated with MGUS (HCC) 11/09/2015   Radiculopathy of sacral and sacrococcygeal region    S1   Seizures (HCC)    Patient Active Problem List   Diagnosis Date Noted   Vitamin B 12 deficiency 03/25/2023   Pseudogout 03/02/2020   Glaucoma of right eye 09/11/2019   Mixed Alzheimer's and vascular dementia (HCC) 09/11/2019   Multiple thyroid nodules 08/11/2018   Closed fracture of left distal radius 05/13/2018   Memory difficulty 05/09/2016   Primary open angle glaucoma of left eye, moderate stage 11/30/2015   Neuropathy associated with MGUS (HCC) 11/09/2015   Hypertensive retinopathy of both eyes 10/25/2015   Posterior vitreous detachment of left eye 10/25/2015   Generalized convulsive epilepsy  without intractable epilepsy (HCC) 03/22/2015   Unspecified psychosis 11/04/2012   Unspecified hereditary and idiopathic peripheral neuropathy 11/04/2012   Thoracic or lumbosacral neuritis or radiculitis, unspecified 11/04/2012   Lumbosacral root lesions, not elsewhere classified 11/04/2012   Dry eye syndrome of bilateral lacrimal glands 05/07/2012   Ptosis 05/07/2012   Essential hypertension 01/22/2008   INTERNAL HEMORRHOIDS 01/22/2008   COPD (chronic obstructive pulmonary disease) (HCC) 01/22/2008   Osteoarthritis of multiple joints 01/22/2008   Senile osteoporosis 01/22/2008   Anorexia 01/22/2008   Home Medication(s) Prior to Admission medications   Medication Sig Start Date End Date Taking? Authorizing Provider  acetaminophen (TYLENOL) 325 MG tablet Take 650 mg by mouth every 8 (eight) hours.    [provider]  alendronate (FOSAMAX) 70 MG tablet Take 70 mg by mouth once a week. Take with a full glass of water on an empty stomach. Friday    [provider]  bimatoprost (LUMIGAN) 0.01 % SOLN Place 1 drop into both eyes at bedtime.    [provider]  brimonidine (ALPHAGAN) 0.2 % ophthalmic solution Place 1 drop into the right eye 3 (three) times daily. Wait 10 minutes between drops. (Keep eyes closed for 2 minutes after each drop) 08/04/19   [provider]  chlorhexidine (PERIDEX) 0.12 % solution Use as directed 5 mLs in the mouth or throat at bedtime. 1 tsp  [provider]  Cholecalciferol (VITAMIN D3) 50 MCG (2000 UT) TABS Take by mouth.    [provider]  cyanocobalamin 1000 MCG tablet Take 1,000 mcg by mouth in the morning.    [provider]  diclofenac Sodium (VOLTAREN) 1 % GEL Apply 4 g topically as needed (Apply to left hip prn for pain or legft thumb pain).    [provider]  docusate sodium (COLACE) 100 MG capsule Take 100 mg by mouth daily.    [provider]  donepezil (ARICEPT) 10 MG tablet  Take 10 mg by mouth at bedtime.    [provider]  dorzolamide-timolol (COSOPT) 22.3-6.8 MG/ML ophthalmic solution Place 1 drop into the right eye 2 (two) times daily. Wait 10 minutes between drops. (Keep eyes closed for 2 minutes after each drop) 08/03/14   [provider]  lamoTRIgine (LAMICTAL) 100 MG tablet Take 1 tablet in morning, 1 and 1/2 tablets at night 06/29/19   Glean Salvo, NP  memantine (NAMENDA) 5 MG tablet Take 10 mg by mouth 2 (two) times daily.    [provider]  Multiple Vitamin (MULTIVITAMIN) tablet Take 1 tablet by mouth daily.    [provider]  pilocarpine (PILOCAR) 4 % ophthalmic solution Place 1 drop into the right eye 4 (four) times daily. Wait 10 minutes between drops. (Keep eyes closed for 2 minutes after each drop) 08/04/19   [provider]  polyethylene glycol (MIRALAX) 17 g packet Take 17 g by mouth every other day. 04/19/23   Fletcher Anon, NP  RHOPRESSA 0.02 % SOLN Apply 1 drop to eye every evening. 11/02/22   [provider]                                                                                                                                    Allergies Hctz [hydrochlorothiazide] and Other  Review of Systems Review of Systems As noted in HPI  Physical Exam Vital Signs  I have reviewed the triage vital signs BP (!) 155/70   Pulse 81   Temp (!) 97.5 F (36.4 C)   Resp 18   Ht 5\' 5"  (1.651 m)   Wt 60 kg   SpO2 98%   BMI 22.01 kg/m   Physical Exam Constitutional:      General: She is not in acute distress.    Appearance: She is well-developed. She is not diaphoretic.  HENT:     Head: Normocephalic and atraumatic.      Right Ear: External ear normal.     Left Ear: External ear normal.     Nose: Nose normal.  Eyes:     General: No scleral icterus.       Right eye: No discharge.        Left eye: No discharge.     Conjunctiva/sclera: Conjunctivae normal.     Pupils: Pupils are  equal, round, and reactive  to light.  Cardiovascular:     Rate and Rhythm: Normal rate and regular rhythm.     Pulses:          Radial pulses are 2+ on the right side and 2+ on the left side.       Dorsalis pedis pulses are 2+ on the right side and 2+ on the left side.     Heart sounds: Normal heart sounds. No murmur heard.    No friction rub. No gallop.  Pulmonary:     Effort: Pulmonary effort is normal. No respiratory distress.     Breath sounds: Normal breath sounds. No stridor. No wheezing.  Abdominal:     General: There is no distension.     Palpations: Abdomen is soft.     Tenderness: There is no abdominal tenderness.  Musculoskeletal:        General: No tenderness.     Cervical back: Normal range of motion and neck supple. No bony tenderness.     Thoracic back: No bony tenderness.     Lumbar back: No bony tenderness.     Comments: Clavicles stable. Chest stable to AP/Lat compression. Pelvis stable to Lat compression. No obvious extremity deformity. No chest or abdominal wall contusion.  Skin:    General: Skin is warm and dry.     Findings: No erythema or rash.  Neurological:     Mental Status: She is alert and oriented to person, place, and time.     Comments: Moving all extremities     ED Results and Treatments Labs (all labs ordered are listed, but only abnormal results are displayed) Labs Reviewed - No data to display                                                                                                                       EKG  EKG Interpretation  Date/Time:    Ventricular Rate:    PR Interval:    QRS Duration:   QT Interval:    QTC Calculation:   R Axis:     Text Interpretation:         Radiology CT Head Wo Contrast  Result Date: 04/22/2023 CLINICAL DATA:  Head and neck trauma, moderate to severe EXAM: CT HEAD WITHOUT CONTRAST CT CERVICAL SPINE WITHOUT CONTRAST TECHNIQUE: Multidetector CT imaging of the head and cervical spine was  performed following the standard protocol without intravenous contrast. Multiplanar CT image reconstructions of the cervical spine were also generated. RADIATION DOSE REDUCTION: This exam was performed according to the departmental dose-optimization program which includes automated exposure control, adjustment of the mA and/or kV according to patient size and/or use of iterative reconstruction technique. COMPARISON:  CT head and cervical spine 10/12/2022 FINDINGS: CT HEAD FINDINGS Brain: No intracranial hemorrhage, mass effect, or evidence of acute infarct. No hydrocephalus. No extra-axial fluid collection. Generalized cerebral atrophy. Ill-defined hypoattenuation within the cerebral white matter is nonspecific but consistent with chronic small vessel ischemic disease. Vascular: No hyperdense vessel.  Intracranial arterial calcification. Skull: No fracture or focal lesion.  Left forehead scalp hematoma. Sinuses/Orbits: No acute finding. Paranasal sinuses and mastoid air cells are well aerated. Other: None. CT CERVICAL SPINE FINDINGS Alignment: No evidence of traumatic malalignment. Skull base and vertebrae: No acute fracture. No primary bone lesion or focal pathologic process. Soft tissues and spinal canal: No prevertebral fluid or swelling. No visible canal hematoma. Disc levels: Multilevel spondylosis, disc space height loss, degenerative endplate changes greatest at C4-C5, C5-C6, and C6-C7 where it is advanced. Multilevel facet arthropathy. Advanced neural foraminal narrowing on the left at C4-C5, on the left at C5-C6 secondary to uncovertebral spurring and facet arthropathy. No severe spinal canal narrowing. Upper chest: Negative. Other: None. IMPRESSION: 1. No acute intracranial abnormality. Generalized atrophy and small vessel white matter disease. 2. No acute fracture in the cervical spine. Multilevel degenerative spondylosis. 3. Left forehead scalp hematoma.  No calvarial fracture. Electronically Signed   By:  Minerva Fester M.D.   On: 04/22/2023 01:27   CT Cervical Spine Wo Contrast  Result Date: 04/22/2023 CLINICAL DATA:  Head and neck trauma, moderate to severe EXAM: CT HEAD WITHOUT CONTRAST CT CERVICAL SPINE WITHOUT CONTRAST TECHNIQUE: Multidetector CT imaging of the head and cervical spine was performed following the standard protocol without intravenous contrast. Multiplanar CT image reconstructions of the cervical spine were also generated. RADIATION DOSE REDUCTION: This exam was performed according to the departmental dose-optimization program which includes automated exposure control, adjustment of the mA and/or kV according to patient size and/or use of iterative reconstruction technique. COMPARISON:  CT head and cervical spine 10/12/2022 FINDINGS: CT HEAD FINDINGS Brain: No intracranial hemorrhage, mass effect, or evidence of acute infarct. No hydrocephalus. No extra-axial fluid collection. Generalized cerebral atrophy. Ill-defined hypoattenuation within the cerebral white matter is nonspecific but consistent with chronic small vessel ischemic disease. Vascular: No hyperdense vessel. Intracranial arterial calcification. Skull: No fracture or focal lesion.  Left forehead scalp hematoma. Sinuses/Orbits: No acute finding. Paranasal sinuses and mastoid air cells are well aerated. Other: None. CT CERVICAL SPINE FINDINGS Alignment: No evidence of traumatic malalignment. Skull base and vertebrae: No acute fracture. No primary bone lesion or focal pathologic process. Soft tissues and spinal canal: No prevertebral fluid or swelling. No visible canal hematoma. Disc levels: Multilevel spondylosis, disc space height loss, degenerative endplate changes greatest at C4-C5, C5-C6, and C6-C7 where it is advanced. Multilevel facet arthropathy. Advanced neural foraminal narrowing on the left at C4-C5, on the left at C5-C6 secondary to uncovertebral spurring and facet arthropathy. No severe spinal canal narrowing. Upper  chest: Negative. Other: None. IMPRESSION: 1. No acute intracranial abnormality. Generalized atrophy and small vessel white matter disease. 2. No acute fracture in the cervical spine. Multilevel degenerative spondylosis. 3. Left forehead scalp hematoma.  No calvarial fracture. Electronically Signed   By: Minerva Fester M.D.   On: 04/22/2023 01:27    Medications Ordered in ED Medications  lidocaine-EPINEPHrine-tetracaine (LET) topical gel (3 mLs Topical Given 04/22/23 0124)   Procedures .Marland KitchenLaceration Repair  Date/Time: 04/22/2023 2:56 AM  Performed by: Nira Conn, MD Authorized by: Nira Conn, MD   Consent:    Consent obtained:  Emergent situation   Risks discussed:  Poor cosmetic result Universal protocol:    Imaging studies available: yes     Patient identity confirmed:  Arm band Anesthesia:    Anesthesia method:  Topical application   Topical anesthetic:  LET Laceration details:    Location:  Face   Face location:  Forehead   Length (cm):  1   Depth (mm):  2 Exploration:    Hemostasis achieved with:  Direct pressure Treatment:    Area cleansed with:  Saline   Amount of cleaning:  Standard Skin repair:    Repair method:  Steri-Strips and tissue adhesive   Number of Steri-Strips:  1 Approximation:    Approximation:  Close Repair type:    Repair type:  Simple Post-procedure details:    Procedure completion:  Tolerated   (including critical care time) Medical Decision Making / ED Course  Click here for ABCD2, HEART and other calculators  Medical Decision Making Amount and/or Complexity of Data Reviewed Radiology: ordered and independent interpretation performed. Decision-making details documented in ED Course.    Fall at his skilled nursing facility Obvious head trauma ABCs intact Secondary as above. No other findings concerning for severe injuries on exam requiring workup.  CT head and cervical spine negative for any acute  injuries. Laceration was irrigated and closed as above. Spoke to patient's relative who will come and pick up the patient and take her back to the facility.      Final Clinical Impression(s) / ED Diagnoses Final diagnoses:  Fall at nursing home, initial encounter  Forehead contusion, initial encounter  Facial laceration, initial encounter   The patient appears reasonably screened and/or stabilized for discharge and I doubt any other medical condition or other [facility] requiring further screening, evaluation, or treatment in the ED at this time. I have discussed the findings, Dx and Tx plan with the patient/family who expressed understanding and agree(s) with the plan. Discharge instructions discussed at length. The patient/family was given strict return precautions who verbalized understanding of the instructions. No further questions at time of discharge.  Disposition: Discharge  Condition: Good  ED Discharge Orders     None       Follow Up: Mahlon Gammon, MD [address] [place] [address] [phone]  Call  as needed           This chart was dictated using voice recognition software.  Despite best efforts to proofread,  errors can occur which can change the documentation meaning.    Nira Conn, MD 04/22/23 [phone]

## 2023-04-22 NOTE — Progress Notes (Signed)
   04/22/23 0015  Spiritual Encounters  Type of Visit Initial  Care provided to: Patient  Conversation partners present during encounter Nurse  Referral source Chaplain assessment  Reason for visit Routine spiritual support  OnCall Visit Yes  Interventions  Spiritual Care Interventions Made Established relationship of care and support;Compassionate presence;Reflective listening;Encouragement  Intervention Outcomes  Outcomes Connection to spiritual care;Awareness of support;Reduced anxiety;Reduced fear;Reduced isolation   Chaplain was rounding in the ED. Patient was speaking to a NT and appeared anxious. NT needed to care for other patients. Chaplain spoke to patient and provided spiritual and emotional support. Patient shared what she thought had happened and how she "ended up" in the ED. Patient appeared confused about where she was living or what family she was with. Patient became less anxious and was able to rest. No further needs at this time.   Arlyce Dice, Chaplain Resident [phone]

## 2023-04-23 DIAGNOSIS — G308 Other Alzheimer's disease: Secondary | ICD-10-CM | POA: Diagnosis not present

## 2023-04-23 DIAGNOSIS — R296 Repeated falls: Secondary | ICD-10-CM | POA: Diagnosis not present

## 2023-04-23 DIAGNOSIS — R278 Other lack of coordination: Secondary | ICD-10-CM | POA: Diagnosis not present

## 2023-04-23 DIAGNOSIS — M25542 Pain in joints of left hand: Secondary | ICD-10-CM | POA: Diagnosis not present

## 2023-04-23 DIAGNOSIS — M6389 Disorders of muscle in diseases classified elsewhere, multiple sites: Secondary | ICD-10-CM | POA: Diagnosis not present

## 2023-04-26 DIAGNOSIS — R296 Repeated falls: Secondary | ICD-10-CM | POA: Diagnosis not present

## 2023-04-26 DIAGNOSIS — M6389 Disorders of muscle in diseases classified elsewhere, multiple sites: Secondary | ICD-10-CM | POA: Diagnosis not present

## 2023-04-26 DIAGNOSIS — G308 Other Alzheimer's disease: Secondary | ICD-10-CM | POA: Diagnosis not present

## 2023-04-26 DIAGNOSIS — M25542 Pain in joints of left hand: Secondary | ICD-10-CM | POA: Diagnosis not present

## 2023-04-26 DIAGNOSIS — R278 Other lack of coordination: Secondary | ICD-10-CM | POA: Diagnosis not present

## 2023-04-29 DIAGNOSIS — R278 Other lack of coordination: Secondary | ICD-10-CM | POA: Diagnosis not present

## 2023-04-29 DIAGNOSIS — M25542 Pain in joints of left hand: Secondary | ICD-10-CM | POA: Diagnosis not present

## 2023-04-29 DIAGNOSIS — M6389 Disorders of muscle in diseases classified elsewhere, multiple sites: Secondary | ICD-10-CM | POA: Diagnosis not present

## 2023-04-29 DIAGNOSIS — R296 Repeated falls: Secondary | ICD-10-CM | POA: Diagnosis not present

## 2023-04-29 DIAGNOSIS — G308 Other Alzheimer's disease: Secondary | ICD-10-CM | POA: Diagnosis not present

## 2023-04-30 DIAGNOSIS — M25542 Pain in joints of left hand: Secondary | ICD-10-CM | POA: Diagnosis not present

## 2023-04-30 DIAGNOSIS — G308 Other Alzheimer's disease: Secondary | ICD-10-CM | POA: Diagnosis not present

## 2023-04-30 DIAGNOSIS — M6389 Disorders of muscle in diseases classified elsewhere, multiple sites: Secondary | ICD-10-CM | POA: Diagnosis not present

## 2023-04-30 DIAGNOSIS — R296 Repeated falls: Secondary | ICD-10-CM | POA: Diagnosis not present

## 2023-04-30 DIAGNOSIS — R278 Other lack of coordination: Secondary | ICD-10-CM | POA: Diagnosis not present

## 2023-05-02 DIAGNOSIS — M25542 Pain in joints of left hand: Secondary | ICD-10-CM | POA: Diagnosis not present

## 2023-05-02 DIAGNOSIS — R296 Repeated falls: Secondary | ICD-10-CM | POA: Diagnosis not present

## 2023-05-02 DIAGNOSIS — R278 Other lack of coordination: Secondary | ICD-10-CM | POA: Diagnosis not present

## 2023-05-02 DIAGNOSIS — M6389 Disorders of muscle in diseases classified elsewhere, multiple sites: Secondary | ICD-10-CM | POA: Diagnosis not present

## 2023-05-02 DIAGNOSIS — G308 Other Alzheimer's disease: Secondary | ICD-10-CM | POA: Diagnosis not present

## 2023-05-06 DIAGNOSIS — M25542 Pain in joints of left hand: Secondary | ICD-10-CM | POA: Diagnosis not present

## 2023-05-06 DIAGNOSIS — M6389 Disorders of muscle in diseases classified elsewhere, multiple sites: Secondary | ICD-10-CM | POA: Diagnosis not present

## 2023-05-06 DIAGNOSIS — R278 Other lack of coordination: Secondary | ICD-10-CM | POA: Diagnosis not present

## 2023-05-06 DIAGNOSIS — G308 Other Alzheimer's disease: Secondary | ICD-10-CM | POA: Diagnosis not present

## 2023-05-06 DIAGNOSIS — R296 Repeated falls: Secondary | ICD-10-CM | POA: Diagnosis not present

## 2023-05-07 DIAGNOSIS — R278 Other lack of coordination: Secondary | ICD-10-CM | POA: Diagnosis not present

## 2023-05-07 DIAGNOSIS — M25542 Pain in joints of left hand: Secondary | ICD-10-CM | POA: Diagnosis not present

## 2023-05-07 DIAGNOSIS — R296 Repeated falls: Secondary | ICD-10-CM | POA: Diagnosis not present

## 2023-05-07 DIAGNOSIS — M6389 Disorders of muscle in diseases classified elsewhere, multiple sites: Secondary | ICD-10-CM | POA: Diagnosis not present

## 2023-05-07 DIAGNOSIS — G308 Other Alzheimer's disease: Secondary | ICD-10-CM | POA: Diagnosis not present

## 2023-05-09 DIAGNOSIS — R278 Other lack of coordination: Secondary | ICD-10-CM | POA: Diagnosis not present

## 2023-05-09 DIAGNOSIS — M6389 Disorders of muscle in diseases classified elsewhere, multiple sites: Secondary | ICD-10-CM | POA: Diagnosis not present

## 2023-05-09 DIAGNOSIS — G308 Other Alzheimer's disease: Secondary | ICD-10-CM | POA: Diagnosis not present

## 2023-05-09 DIAGNOSIS — M25542 Pain in joints of left hand: Secondary | ICD-10-CM | POA: Diagnosis not present

## 2023-05-09 DIAGNOSIS — R296 Repeated falls: Secondary | ICD-10-CM | POA: Diagnosis not present

## 2023-05-13 DIAGNOSIS — R296 Repeated falls: Secondary | ICD-10-CM | POA: Diagnosis not present

## 2023-05-13 DIAGNOSIS — M25542 Pain in joints of left hand: Secondary | ICD-10-CM | POA: Diagnosis not present

## 2023-05-13 DIAGNOSIS — R278 Other lack of coordination: Secondary | ICD-10-CM | POA: Diagnosis not present

## 2023-05-13 DIAGNOSIS — H401133 Primary open-angle glaucoma, bilateral, severe stage: Secondary | ICD-10-CM | POA: Diagnosis not present

## 2023-05-13 DIAGNOSIS — G308 Other Alzheimer's disease: Secondary | ICD-10-CM | POA: Diagnosis not present

## 2023-05-13 DIAGNOSIS — M6389 Disorders of muscle in diseases classified elsewhere, multiple sites: Secondary | ICD-10-CM | POA: Diagnosis not present

## 2023-05-14 ENCOUNTER — Non-Acute Institutional Stay (SKILLED_NURSING_FACILITY): Payer: Medicare Other | Admitting: Orthopedic Surgery

## 2023-05-14 ENCOUNTER — Encounter: Payer: Self-pay | Admitting: Orthopedic Surgery

## 2023-05-14 DIAGNOSIS — H401122 Primary open-angle glaucoma, left eye, moderate stage: Secondary | ICD-10-CM

## 2023-05-14 DIAGNOSIS — G309 Alzheimer's disease, unspecified: Secondary | ICD-10-CM | POA: Diagnosis not present

## 2023-05-14 DIAGNOSIS — F0150 Vascular dementia without behavioral disturbance: Secondary | ICD-10-CM

## 2023-05-14 DIAGNOSIS — R569 Unspecified convulsions: Secondary | ICD-10-CM

## 2023-05-14 DIAGNOSIS — R278 Other lack of coordination: Secondary | ICD-10-CM | POA: Diagnosis not present

## 2023-05-14 DIAGNOSIS — M810 Age-related osteoporosis without current pathological fracture: Secondary | ICD-10-CM

## 2023-05-14 DIAGNOSIS — M25542 Pain in joints of left hand: Secondary | ICD-10-CM | POA: Diagnosis not present

## 2023-05-14 DIAGNOSIS — R296 Repeated falls: Secondary | ICD-10-CM | POA: Diagnosis not present

## 2023-05-14 DIAGNOSIS — R2681 Unsteadiness on feet: Secondary | ICD-10-CM | POA: Diagnosis not present

## 2023-05-14 DIAGNOSIS — E042 Nontoxic multinodular goiter: Secondary | ICD-10-CM | POA: Diagnosis not present

## 2023-05-14 DIAGNOSIS — S0083XD Contusion of other part of head, subsequent encounter: Secondary | ICD-10-CM

## 2023-05-14 DIAGNOSIS — G308 Other Alzheimer's disease: Secondary | ICD-10-CM | POA: Diagnosis not present

## 2023-05-14 DIAGNOSIS — F0280 Dementia in other diseases classified elsewhere without behavioral disturbance: Secondary | ICD-10-CM

## 2023-05-14 DIAGNOSIS — M6389 Disorders of muscle in diseases classified elsewhere, multiple sites: Secondary | ICD-10-CM | POA: Diagnosis not present

## 2023-05-14 NOTE — Progress Notes (Signed)
[facility] Room Number: 315-A Place of Service:  SNF [id number]) Provider:  Hazle Nordmann, NP  PCP: Mahlon Gammon, MD  Patient Care Team: Mahlon Gammon, MD as PCP - General (Internal Medicine) York Spaniel, MD (Inactive) as Consulting Physician (Neurology)  Extended Emergency Contact Information Primary Emergency Contact: [facility] Home Phone: [phone] Mobile Phone: [phone] Relation: Relative Secondary Emergency Contact: wright,Aerielle Mobile Phone: [phone] Relation: Daughter  Code Status:  DNR Goals of care: Advanced Directive information    05/14/2023    9:42 AM  Advanced Directives  Does Patient Have a Medical Advance Directive? Yes  Type of Estate agent of [place];Living will;Out of facility DNR (pink MOST or yellow form)  Does patient want to make changes to medical advance directive? No - Patient declined  Copy of Healthcare Power of Attorney in Chart? Yes - validated most recent copy scanned in chart (See row information)     Chief Complaint  Patient presents with   Medical Management of Chronic Issues    Routine Visit.     HPI:  Pt is a 89 y.o. female seen today for medical management of chronic diseases.    She currently resides on the memory care unit at [facility] due to AD. PMH: HTN, COPD, epilepsy, neuropathy, pseudogout,OA, osteoporosis, pelvic fracture 10/2022, open angle glaucoma.    Contusion of forehead- 05/19 unwitnessed fall> ED evaluation> CT head negative for acute intracranial abnormality> EKG NSR with 1st degree AV block> orthostatics normal Seizure- followed by neurology, 2 episodes 1 month apart 03/2023-04/2023> thought to be vagal, remains on Lamictal  Alzheimer's dementia- no behaviors, MMSE 18/30 03/2022, dependent with ADLs except feeding, ambulates with wheelchair, remains on Aricept and Namenda Unstable gait- unwitnessed fall with head injury  05/19, now has automatic wheelchair locks Glaucoma- followed by ophthalmology, remains on brimonidine/bimatoprost/dorzolamide-timolol/pilocarpine  Thyroid nodules- 12/14/2022 thyroid ultrasound with nodule 3.5 cm x 1.9 cm x 1.4 cm (right lobe)>fine needle aspiration recommended> HPOA does not want aggressive treatment, TSH 1.91/ T4 5.8 11/2021  Osteoporosis- DEXA 2021, t score -5.2, remains on Fosamax  Recent blood pressures:  06/05- 143/76  05/29- 126/64  05/22- 162/82   Recent weights:  06/01- 133.2 lbs  05/01- 132.4 lbs  04/01- 133 lbs    Past Medical History:  Diagnosis Date   Arthritis    Per [facility] new patient packet    Epilepsy (HCC)    Per [facility] new patient packet    Glaucoma    Hearing deficit    Hearing aids   High blood pressure    Per [facility] new patient packet    History of abnormal weight loss    Low back pain    Memory difficulty 05/09/2016   MGUS (monoclonal gammopathy of unknown significance)    Neuropathy associated with MGUS (HCC) 11/09/2015   Radiculopathy of sacral and sacrococcygeal region    S1   Seizures (HCC)    Past Surgical History:  Procedure Laterality Date   APPENDECTOMY     BACK SURGERY     CATARACT EXTRACTION Left    OVARY SURGERY     PARATHYROIDECTOMY  9/   TRABECULECTOMY Left     Allergies  Allergen Reactions   Hctz [Hydrochlorothiazide]    Other Other (See Comments)    Caused sodium to drop    Allergies as of 05/14/2023       Reactions   Hctz [hydrochlorothiazide]    Other Other (See Comments)  Caused sodium to drop        Medication List        Accurate as of May 14, 2023  9:42 AM. If you have any questions, ask your nurse or doctor.          acetaminophen 325 MG tablet Commonly known as: TYLENOL Take 650 mg by mouth every 8 (eight) hours.   alendronate 70 MG tablet Commonly known as: FOSAMAX Take 70 mg by mouth once a week. Take with a full glass of water on an empty stomach. Friday   bimatoprost 0.01 %  Soln Commonly known as: LUMIGAN Place 1 drop into both eyes at bedtime.   brimonidine 0.2 % ophthalmic solution Commonly known as: ALPHAGAN Place 1 drop into the right eye 3 (three) times daily. Wait 10 minutes between drops. (Keep eyes closed for 2 minutes after each drop)   chlorhexidine 0.12 % solution Commonly known as: PERIDEX Use as directed 5 mLs in the mouth or throat at bedtime. 1 tsp   cyanocobalamin 1000 MCG tablet Take 1,000 mcg by mouth in the morning.   diclofenac Sodium 1 % Gel Commonly known as: VOLTAREN Apply 4 g topically as needed (Apply to left hip prn for pain or legft thumb pain).   docusate sodium 100 MG capsule Commonly known as: COLACE Take 100 mg by mouth daily.   donepezil 10 MG tablet Commonly known as: ARICEPT Take 10 mg by mouth at bedtime.   dorzolamide-timolol 2-0.5 % ophthalmic solution Commonly known as: COSOPT Place 1 drop into the right eye 2 (two) times daily. Wait 10 minutes between drops. (Keep eyes closed for 2 minutes after each drop)   lamoTRIgine 100 MG tablet Commonly known as: LAMICTAL Take 1 tablet in morning, 1 and 1/2 tablets at night   memantine 5 MG tablet Commonly known as: NAMENDA Take 10 mg by mouth 2 (two) times daily.   multivitamin tablet Take 1 tablet by mouth daily.   pilocarpine 4 % ophthalmic solution Commonly known as: PILOCAR Place 1 drop into the right eye 4 (four) times daily. Wait 10 minutes between drops. (Keep eyes closed for 2 minutes after each drop)   polyethylene glycol 17 g packet Commonly known as: MiraLax Take 17 g by mouth every other day.   Rhopressa 0.02 % Soln Generic drug: Netarsudil Dimesylate Apply 1 drop to eye every evening.   Vitamin D3 50 MCG (2000 UT) Tabs Take by mouth.        Review of Systems  Unable to perform ROS: Dementia    Immunization History  Administered Date(s) Administered   Covid-19, Mrna,Vaccine(Spikevax)65yrs and older 10/08/2022   Influenza, High  Dose Seasonal PF 09/26/2015, 09/03/2017, 08/28/2018, 09/23/2020, 09/07/2022   Influenza,inj,Quad PF,6+ Mos 10/01/2019   Influenza-Unspecified 09/22/2009, 08/27/2011, 10/08/2012, 12/08/2014, 02/10/2018, 09/06/2021   Moderna Covid-19 Vaccine Bivalent Booster 27yrs & up 04/19/2022   Moderna Sars-Covid-2 Vaccination 12/14/2019, 01/12/2020, 09/20/2020, 10/13/2020, 10/08/2022   Pneumococcal Conjugate-13 12/31/2013   Pneumococcal Polysaccharide-23 12/04/1999, 02/02/2016   Td 12/04/2000   Tdap 11/07/2012, 01/18/2023   Zoster Recombinat (Shingrix) 04/01/2022, 06/01/2022   Pertinent  Health Maintenance Due  Topic Date Due   INFLUENZA VACCINE  07/04/2023   DEXA SCAN  Completed      10/12/2022    3:54 AM 11/20/2022    2:03 PM 12/11/2022   11:10 AM 02/19/2023    2:40 PM 04/11/2023   11:32 AM  Fall Risk  Falls in the past year?  0 0 1 1  Was  there an injury with Fall?  0 0 0 1  Fall Risk Category Calculator  0 0 1 2  Fall Risk Category (Retired)  Low Low    (RETIRED) Patient Fall Risk Level High fall risk Low fall risk Low fall risk    Patient at Risk for Falls Due to   No Fall Risks History of fall(s);Impaired balance/gait;Impaired mobility History of fall(s)  Fall risk Follow up  Falls evaluation completed Falls evaluation completed Falls evaluation completed;Education provided;Falls prevention discussed Falls evaluation completed   Functional Status Survey:    Vitals:   05/14/23 0939  BP: (!) 143/76  Pulse: 77  Resp: 18  Temp: (!) 97 F (36.1 C)  SpO2: 97%  Weight: 133 lb 3.2 oz (60.4 kg)  Height: 5\' 5"  (1.651 m)   Body mass index is 22.17 kg/m. Physical Exam Vitals reviewed.  Constitutional:      General: She is not in acute distress. HENT:     Head: Normocephalic.     Comments: Laceration healed. Small dime sized bruise to left brow line>almost healed.     Right Ear: There is no impacted cerumen.     Left Ear: There is no impacted cerumen.     Nose: Nose normal.      Mouth/Throat:     Mouth: Mucous membranes are moist.  Eyes:     General:        Right eye: No discharge.        Left eye: No discharge.  Cardiovascular:     Rate and Rhythm: Normal rate and regular rhythm.     Pulses: Normal pulses.     Heart sounds: Normal heart sounds.  Pulmonary:     Effort: Pulmonary effort is normal. No respiratory distress.     Breath sounds: Normal breath sounds. No wheezing.  Abdominal:     General: Bowel sounds are normal.     Palpations: Abdomen is soft.  Musculoskeletal:     Cervical back: Neck supple.     Right lower leg: No edema.     Left lower leg: No edema.  Skin:    General: Skin is warm.     Capillary Refill: Capillary refill takes less than 2 seconds.  Neurological:     General: No focal deficit present.     Mental Status: She is alert. Mental status is at baseline.     Motor: Weakness present.     Gait: Gait abnormal.     Comments: wheelchair  Psychiatric:        Mood and Affect: Mood normal.     Labs reviewed: Recent Labs    06/12/22 0000 10/12/22 0923  NA 142 140  K 4.1 3.7  CL 105 107  CO2 24* 25  GLUCOSE  --  111*  BUN 13 13  CREATININE 0.1* 0.81  CALCIUM 8.9 8.5*   No results for input(s): "AST", "ALT", "ALKPHOS", "BILITOT", "PROT", "ALBUMIN" in the last 8760 hours. Recent Labs    06/12/22 0000 10/12/22 0923  WBC 5.4 9.5  HGB 12.8 12.0  HCT 34* 36.2  MCV  --  96.0  PLT 171 142*   Lab Results  Component Value Date   TSH 1.91 11/21/2021   No results found for: "HGBA1C" Lab Results  Component Value Date   CHOL 241 (A) 09/10/2019   HDL 99 (A) 09/10/2019   LDLCALC 128 09/10/2019   TRIG 72 09/10/2019    Significant Diagnostic Results in last 30 days:  CT Head Wo  Contrast  Result Date: 04/22/2023 CLINICAL DATA:  Head and neck trauma, moderate to severe EXAM: CT HEAD WITHOUT CONTRAST CT CERVICAL SPINE WITHOUT CONTRAST TECHNIQUE: Multidetector CT imaging of the head and cervical spine was performed following  the standard protocol without intravenous contrast. Multiplanar CT image reconstructions of the cervical spine were also generated. RADIATION DOSE REDUCTION: This exam was performed according to the departmental dose-optimization program which includes automated exposure control, adjustment of the mA and/or kV according to patient size and/or use of iterative reconstruction technique. COMPARISON:  CT head and cervical spine 10/12/2022 FINDINGS: CT HEAD FINDINGS Brain: No intracranial hemorrhage, mass effect, or evidence of acute infarct. No hydrocephalus. No extra-axial fluid collection. Generalized cerebral atrophy. Ill-defined hypoattenuation within the cerebral white matter is nonspecific but consistent with chronic small vessel ischemic disease. Vascular: No hyperdense vessel. Intracranial arterial calcification. Skull: No fracture or focal lesion.  Left forehead scalp hematoma. Sinuses/Orbits: No acute finding. Paranasal sinuses and mastoid air cells are well aerated. Other: None. CT CERVICAL SPINE FINDINGS Alignment: No evidence of traumatic malalignment. Skull base and vertebrae: No acute fracture. No primary bone lesion or focal pathologic process. Soft tissues and spinal canal: No prevertebral fluid or swelling. No visible canal hematoma. Disc levels: Multilevel spondylosis, disc space height loss, degenerative endplate changes greatest at C4-C5, C5-C6, and C6-C7 where it is advanced. Multilevel facet arthropathy. Advanced neural foraminal narrowing on the left at C4-C5, on the left at C5-C6 secondary to uncovertebral spurring and facet arthropathy. No severe spinal canal narrowing. Upper chest: Negative. Other: None. IMPRESSION: 1. No acute intracranial abnormality. Generalized atrophy and small vessel white matter disease. 2. No acute fracture in the cervical spine. Multilevel degenerative spondylosis. 3. Left forehead scalp hematoma.  No calvarial fracture. Electronically Signed   By: Minerva Fester  M.D.   On: 04/22/2023 01:27   CT Cervical Spine Wo Contrast  Result Date: 04/22/2023 CLINICAL DATA:  Head and neck trauma, moderate to severe EXAM: CT HEAD WITHOUT CONTRAST CT CERVICAL SPINE WITHOUT CONTRAST TECHNIQUE: Multidetector CT imaging of the head and cervical spine was performed following the standard protocol without intravenous contrast. Multiplanar CT image reconstructions of the cervical spine were also generated. RADIATION DOSE REDUCTION: This exam was performed according to the departmental dose-optimization program which includes automated exposure control, adjustment of the mA and/or kV according to patient size and/or use of iterative reconstruction technique. COMPARISON:  CT head and cervical spine 10/12/2022 FINDINGS: CT HEAD FINDINGS Brain: No intracranial hemorrhage, mass effect, or evidence of acute infarct. No hydrocephalus. No extra-axial fluid collection. Generalized cerebral atrophy. Ill-defined hypoattenuation within the cerebral white matter is nonspecific but consistent with chronic small vessel ischemic disease. Vascular: No hyperdense vessel. Intracranial arterial calcification. Skull: No fracture or focal lesion.  Left forehead scalp hematoma. Sinuses/Orbits: No acute finding. Paranasal sinuses and mastoid air cells are well aerated. Other: None. CT CERVICAL SPINE FINDINGS Alignment: No evidence of traumatic malalignment. Skull base and vertebrae: No acute fracture. No primary bone lesion or focal pathologic process. Soft tissues and spinal canal: No prevertebral fluid or swelling. No visible canal hematoma. Disc levels: Multilevel spondylosis, disc space height loss, degenerative endplate changes greatest at C4-C5, C5-C6, and C6-C7 where it is advanced. Multilevel facet arthropathy. Advanced neural foraminal narrowing on the left at C4-C5, on the left at C5-C6 secondary to uncovertebral spurring and facet arthropathy. No severe spinal canal narrowing. Upper chest: Negative.  Other: None. IMPRESSION: 1. No acute intracranial abnormality. Generalized atrophy and small vessel white matter  disease. 2. No acute fracture in the cervical spine. Multilevel degenerative spondylosis. 3. Left forehead scalp hematoma.  No calvarial fracture. Electronically Signed   By: Minerva Fester M.D.   On: 04/22/2023 01:27    Assessment/Plan 1. Contusion of forehead, subsequent encounter - unwitnessed fall 05/19 - ED evaluation > CT head no acute intracranial abnormalities - laceration healed - faint dime sized bruise to left browline  2. Seizure (HCC) - 2 episodes from 04-04/2023> thought to be vagal - cont Lamictal  3. Mixed Alzheimer's and vascular dementia (HCC) - no behaviors - dependent with some ADLs - weights stable - cont Aricept and Namenda  4. Unstable gait - see above - wheelchair now has automatic locks   5. Primary open angle glaucoma of left eye, moderate stage - cont brimonidine/bimatoprost/dorzolamide-timolol/pilocarpine   6. Multiple thyroid nodules - 12/14/2022 thyroid ultrasound with nodule 3.5 cm x 1.9 cm x 1.4 cm (right lobe)>fine needle aspiration recommended - HPOA does not want aggressive treatment - TSH 1.91/ T4 5.8 11/2021   7. Senile osteoporosis - DEXA 2021, t score -5.2 - cont fosamax    Family/ staff Communication: plan discussed with patient and nurse  Labs/tests ordered: none

## 2023-05-15 DIAGNOSIS — G308 Other Alzheimer's disease: Secondary | ICD-10-CM | POA: Diagnosis not present

## 2023-05-15 DIAGNOSIS — R278 Other lack of coordination: Secondary | ICD-10-CM | POA: Diagnosis not present

## 2023-05-15 DIAGNOSIS — R296 Repeated falls: Secondary | ICD-10-CM | POA: Diagnosis not present

## 2023-05-15 DIAGNOSIS — M6389 Disorders of muscle in diseases classified elsewhere, multiple sites: Secondary | ICD-10-CM | POA: Diagnosis not present

## 2023-05-15 DIAGNOSIS — M25542 Pain in joints of left hand: Secondary | ICD-10-CM | POA: Diagnosis not present

## 2023-05-17 DIAGNOSIS — R296 Repeated falls: Secondary | ICD-10-CM | POA: Diagnosis not present

## 2023-05-17 DIAGNOSIS — R278 Other lack of coordination: Secondary | ICD-10-CM | POA: Diagnosis not present

## 2023-05-17 DIAGNOSIS — M6389 Disorders of muscle in diseases classified elsewhere, multiple sites: Secondary | ICD-10-CM | POA: Diagnosis not present

## 2023-05-17 DIAGNOSIS — G308 Other Alzheimer's disease: Secondary | ICD-10-CM | POA: Diagnosis not present

## 2023-05-17 DIAGNOSIS — M25542 Pain in joints of left hand: Secondary | ICD-10-CM | POA: Diagnosis not present

## 2023-05-21 DIAGNOSIS — M25542 Pain in joints of left hand: Secondary | ICD-10-CM | POA: Diagnosis not present

## 2023-05-21 DIAGNOSIS — G308 Other Alzheimer's disease: Secondary | ICD-10-CM | POA: Diagnosis not present

## 2023-05-21 DIAGNOSIS — M6389 Disorders of muscle in diseases classified elsewhere, multiple sites: Secondary | ICD-10-CM | POA: Diagnosis not present

## 2023-05-21 DIAGNOSIS — R278 Other lack of coordination: Secondary | ICD-10-CM | POA: Diagnosis not present

## 2023-05-21 DIAGNOSIS — R296 Repeated falls: Secondary | ICD-10-CM | POA: Diagnosis not present

## 2023-05-22 DIAGNOSIS — R278 Other lack of coordination: Secondary | ICD-10-CM | POA: Diagnosis not present

## 2023-05-22 DIAGNOSIS — G308 Other Alzheimer's disease: Secondary | ICD-10-CM | POA: Diagnosis not present

## 2023-05-22 DIAGNOSIS — M6389 Disorders of muscle in diseases classified elsewhere, multiple sites: Secondary | ICD-10-CM | POA: Diagnosis not present

## 2023-05-22 DIAGNOSIS — R296 Repeated falls: Secondary | ICD-10-CM | POA: Diagnosis not present

## 2023-05-22 DIAGNOSIS — M25542 Pain in joints of left hand: Secondary | ICD-10-CM | POA: Diagnosis not present

## 2023-05-24 DIAGNOSIS — M25542 Pain in joints of left hand: Secondary | ICD-10-CM | POA: Diagnosis not present

## 2023-05-24 DIAGNOSIS — R296 Repeated falls: Secondary | ICD-10-CM | POA: Diagnosis not present

## 2023-05-24 DIAGNOSIS — G308 Other Alzheimer's disease: Secondary | ICD-10-CM | POA: Diagnosis not present

## 2023-05-24 DIAGNOSIS — R278 Other lack of coordination: Secondary | ICD-10-CM | POA: Diagnosis not present

## 2023-05-24 DIAGNOSIS — M6389 Disorders of muscle in diseases classified elsewhere, multiple sites: Secondary | ICD-10-CM | POA: Diagnosis not present

## 2023-05-28 DIAGNOSIS — M6389 Disorders of muscle in diseases classified elsewhere, multiple sites: Secondary | ICD-10-CM | POA: Diagnosis not present

## 2023-05-28 DIAGNOSIS — R278 Other lack of coordination: Secondary | ICD-10-CM | POA: Diagnosis not present

## 2023-05-28 DIAGNOSIS — M25542 Pain in joints of left hand: Secondary | ICD-10-CM | POA: Diagnosis not present

## 2023-05-28 DIAGNOSIS — G308 Other Alzheimer's disease: Secondary | ICD-10-CM | POA: Diagnosis not present

## 2023-05-28 DIAGNOSIS — R296 Repeated falls: Secondary | ICD-10-CM | POA: Diagnosis not present

## 2023-05-30 DIAGNOSIS — M6389 Disorders of muscle in diseases classified elsewhere, multiple sites: Secondary | ICD-10-CM | POA: Diagnosis not present

## 2023-05-30 DIAGNOSIS — R296 Repeated falls: Secondary | ICD-10-CM | POA: Diagnosis not present

## 2023-05-30 DIAGNOSIS — M25542 Pain in joints of left hand: Secondary | ICD-10-CM | POA: Diagnosis not present

## 2023-05-30 DIAGNOSIS — G308 Other Alzheimer's disease: Secondary | ICD-10-CM | POA: Diagnosis not present

## 2023-05-30 DIAGNOSIS — R278 Other lack of coordination: Secondary | ICD-10-CM | POA: Diagnosis not present

## 2023-06-04 DIAGNOSIS — R296 Repeated falls: Secondary | ICD-10-CM | POA: Diagnosis not present

## 2023-06-04 DIAGNOSIS — M25542 Pain in joints of left hand: Secondary | ICD-10-CM | POA: Diagnosis not present

## 2023-06-04 DIAGNOSIS — R278 Other lack of coordination: Secondary | ICD-10-CM | POA: Diagnosis not present

## 2023-06-04 DIAGNOSIS — M6389 Disorders of muscle in diseases classified elsewhere, multiple sites: Secondary | ICD-10-CM | POA: Diagnosis not present

## 2023-06-04 DIAGNOSIS — G308 Other Alzheimer's disease: Secondary | ICD-10-CM | POA: Diagnosis not present

## 2023-06-07 DIAGNOSIS — M25542 Pain in joints of left hand: Secondary | ICD-10-CM | POA: Diagnosis not present

## 2023-06-07 DIAGNOSIS — R278 Other lack of coordination: Secondary | ICD-10-CM | POA: Diagnosis not present

## 2023-06-07 DIAGNOSIS — M6389 Disorders of muscle in diseases classified elsewhere, multiple sites: Secondary | ICD-10-CM | POA: Diagnosis not present

## 2023-06-07 DIAGNOSIS — G308 Other Alzheimer's disease: Secondary | ICD-10-CM | POA: Diagnosis not present

## 2023-06-07 DIAGNOSIS — R296 Repeated falls: Secondary | ICD-10-CM | POA: Diagnosis not present

## 2023-06-11 DIAGNOSIS — R296 Repeated falls: Secondary | ICD-10-CM | POA: Diagnosis not present

## 2023-06-11 DIAGNOSIS — M25542 Pain in joints of left hand: Secondary | ICD-10-CM | POA: Diagnosis not present

## 2023-06-11 DIAGNOSIS — G308 Other Alzheimer's disease: Secondary | ICD-10-CM | POA: Diagnosis not present

## 2023-06-11 DIAGNOSIS — M6389 Disorders of muscle in diseases classified elsewhere, multiple sites: Secondary | ICD-10-CM | POA: Diagnosis not present

## 2023-06-11 DIAGNOSIS — R278 Other lack of coordination: Secondary | ICD-10-CM | POA: Diagnosis not present

## 2023-06-13 DIAGNOSIS — M25542 Pain in joints of left hand: Secondary | ICD-10-CM | POA: Diagnosis not present

## 2023-06-13 DIAGNOSIS — G308 Other Alzheimer's disease: Secondary | ICD-10-CM | POA: Diagnosis not present

## 2023-06-13 DIAGNOSIS — R278 Other lack of coordination: Secondary | ICD-10-CM | POA: Diagnosis not present

## 2023-06-13 DIAGNOSIS — R296 Repeated falls: Secondary | ICD-10-CM | POA: Diagnosis not present

## 2023-06-13 DIAGNOSIS — M6389 Disorders of muscle in diseases classified elsewhere, multiple sites: Secondary | ICD-10-CM | POA: Diagnosis not present

## 2023-06-19 DIAGNOSIS — R278 Other lack of coordination: Secondary | ICD-10-CM | POA: Diagnosis not present

## 2023-06-19 DIAGNOSIS — M25542 Pain in joints of left hand: Secondary | ICD-10-CM | POA: Diagnosis not present

## 2023-06-19 DIAGNOSIS — G308 Other Alzheimer's disease: Secondary | ICD-10-CM | POA: Diagnosis not present

## 2023-06-19 DIAGNOSIS — R296 Repeated falls: Secondary | ICD-10-CM | POA: Diagnosis not present

## 2023-06-19 DIAGNOSIS — M6389 Disorders of muscle in diseases classified elsewhere, multiple sites: Secondary | ICD-10-CM | POA: Diagnosis not present

## 2023-06-20 ENCOUNTER — Non-Acute Institutional Stay (SKILLED_NURSING_FACILITY): Payer: Medicare Other | Admitting: Adult Health

## 2023-06-20 ENCOUNTER — Encounter: Payer: Self-pay | Admitting: Adult Health

## 2023-06-20 DIAGNOSIS — M810 Age-related osteoporosis without current pathological fracture: Secondary | ICD-10-CM | POA: Diagnosis not present

## 2023-06-20 DIAGNOSIS — I10 Essential (primary) hypertension: Secondary | ICD-10-CM | POA: Diagnosis not present

## 2023-06-20 DIAGNOSIS — N1832 Chronic kidney disease, stage 3b: Secondary | ICD-10-CM | POA: Diagnosis not present

## 2023-06-20 DIAGNOSIS — F0280 Dementia in other diseases classified elsewhere without behavioral disturbance: Secondary | ICD-10-CM

## 2023-06-20 DIAGNOSIS — R296 Repeated falls: Secondary | ICD-10-CM | POA: Diagnosis not present

## 2023-06-20 DIAGNOSIS — M6389 Disorders of muscle in diseases classified elsewhere, multiple sites: Secondary | ICD-10-CM | POA: Diagnosis not present

## 2023-06-20 DIAGNOSIS — E042 Nontoxic multinodular goiter: Secondary | ICD-10-CM | POA: Diagnosis not present

## 2023-06-20 DIAGNOSIS — G308 Other Alzheimer's disease: Secondary | ICD-10-CM | POA: Diagnosis not present

## 2023-06-20 DIAGNOSIS — R278 Other lack of coordination: Secondary | ICD-10-CM | POA: Diagnosis not present

## 2023-06-20 DIAGNOSIS — F0150 Vascular dementia without behavioral disturbance: Secondary | ICD-10-CM

## 2023-06-20 DIAGNOSIS — G309 Alzheimer's disease, unspecified: Secondary | ICD-10-CM

## 2023-06-20 DIAGNOSIS — H401122 Primary open-angle glaucoma, left eye, moderate stage: Secondary | ICD-10-CM

## 2023-06-20 DIAGNOSIS — K5901 Slow transit constipation: Secondary | ICD-10-CM | POA: Diagnosis not present

## 2023-06-20 DIAGNOSIS — M25542 Pain in joints of left hand: Secondary | ICD-10-CM | POA: Diagnosis not present

## 2023-06-20 NOTE — Progress Notes (Unsigned)
[facility] Room Number: 315 A Place of Service:  SNF [phone]) Provider:  Fletcher Anon, NP  Mahlon Gammon, MD  Patient Care Team: Mahlon Gammon, MD as PCP - General (Internal Medicine) York Spaniel, MD (Inactive) as Consulting Physician (Neurology)  Extended Emergency Contact Information Primary Emergency Contact: [facility] Home Phone: [phone] Mobile Phone: [phone] Relation: Relative Secondary Emergency Contact: wright,Amarachukwu Mobile Phone: [phone] Relation: Daughter  Code Status:  DNR Goals of care: Advanced Directive information    06/20/2023    2:36 PM  Advanced Directives  Does Patient Have a Medical Advance Directive? Yes  Type of Estate agent of [place];Out of facility DNR (pink MOST or yellow form)  Does patient want to make changes to medical advance directive? No - Patient declined  Copy of Healthcare Power of Attorney in Chart? Yes - validated most recent copy scanned in chart (See row information)  Pre-existing out of facility DNR order (yellow form or pink MOST form) Yellow form placed in chart (order not valid for inpatient use)     Chief Complaint  Patient presents with   Medical Management of Chronic Issues    Routine follow up    HPI:  Pt is a 89 y.o. female seen today for medical management of chronic diseases.    PMH significant for glaucoma, seizures, arthritis, HTN, low back pain, dementia, MGUS, h/x of parathyroidectomy, osteoporosis, and peripheral neuropathy.   Resides in memory care due to progressive dementia. No acute behavioral concerns. Spends most of the day in her room.  Remains ambulatory. MMSE 18/30 05/2023  Seen in the ED due to an unwitnessed fall with head laceration/contusion. CT of the head negative. No orthostatic bp. EKG NSR with 1 degree block.  No further falls recorded since then.   On lamcital for hx of seizures. No reported  episodes since the two in April/May.   Bps reviewed  Blood Pressure: 117 / 66 mmHg  Blood Pressure: 131 / 76 mmHg   Blood Pressure: 106 / 61 mmHg  Blood Pressure: 100 / 61 mmHg  Wt Readings from Last 3 Encounters:  06/20/23 134 lb 9.6 oz (61.1 kg)  05/14/23 133 lb 3.2 oz (60.4 kg)  04/22/23 132 lb 6.4 oz (60.1 kg)    OP: fosamax started 05/2021 T score -5.2 (11/29/20)  Hx of thyroid nodules:  12/14/2022 thyroid ultrasound with nodule 3.5 cm x 1.9 cm x 1.4 cm (right lobe)>fine needle aspiration recommended.  HPOA does not want aggressive treatment, TSH 1.91/ T4 5.8 11/2021   Has glaucoma, gait issues fall risk Past Medical History:  Diagnosis Date   Arthritis    Per [facility] new patient packet    Epilepsy (HCC)    Per [facility] new patient packet    Glaucoma    Hearing deficit    Hearing aids   High blood pressure    Per [facility] new patient packet    History of abnormal weight loss    Low back pain    Memory difficulty 05/09/2016   MGUS (monoclonal gammopathy of unknown significance)    Neuropathy associated with MGUS (HCC) 11/09/2015   Radiculopathy of sacral and sacrococcygeal region    S1   Seizures (HCC)    Past Surgical History:  Procedure Laterality Date   APPENDECTOMY     BACK SURGERY     CATARACT EXTRACTION Left    OVARY SURGERY     PARATHYROIDECTOMY  9/   TRABECULECTOMY Left  Allergies  Allergen Reactions   Hctz [Hydrochlorothiazide]    Other Other (See Comments)    Caused sodium to drop    Allergies as of 06/20/2023       Reactions   Hctz [hydrochlorothiazide]    Other Other (See Comments)   Caused sodium to drop        Medication List        Accurate as of June 20, 2023  2:53 PM. If you have any questions, ask your nurse or doctor.          acetaminophen 325 MG tablet Commonly known as: TYLENOL Take 650 mg by mouth every 8 (eight) hours.   alendronate 70 MG tablet Commonly known as: FOSAMAX Take 70 mg by mouth once a week. Take with a full  glass of water on an empty stomach. Friday   bimatoprost 0.01 % Soln Commonly known as: LUMIGAN Place 1 drop into both eyes at bedtime.   brimonidine 0.2 % ophthalmic solution Commonly known as: ALPHAGAN Place 1 drop into the right eye 3 (three) times daily. Wait 10 minutes between drops. (Keep eyes closed for 2 minutes after each drop)   chlorhexidine 0.12 % solution Commonly known as: PERIDEX Use as directed 5 mLs in the mouth or throat at bedtime. 1 tsp   cyanocobalamin 1000 MCG tablet Take 1,000 mcg by mouth in the morning.   diclofenac Sodium 1 % Gel Commonly known as: VOLTAREN Apply 4 g topically as needed (Apply to left hip prn for pain or legft thumb pain).   docusate sodium 100 MG capsule Commonly known as: COLACE Take 100 mg by mouth daily.   donepezil 10 MG tablet Commonly known as: ARICEPT Take 10 mg by mouth at bedtime.   dorzolamide-timolol 2-0.5 % ophthalmic solution Commonly known as: COSOPT Place 1 drop into the right eye 2 (two) times daily. Wait 10 minutes between drops. (Keep eyes closed for 2 minutes after each drop)   lamoTRIgine 100 MG tablet Commonly known as: LAMICTAL Take 1 tablet in morning, 1 and 1/2 tablets at night   memantine 5 MG tablet Commonly known as: NAMENDA Take 10 mg by mouth 2 (two) times daily.   multivitamin tablet Take 1 tablet by mouth daily.   pilocarpine 4 % ophthalmic solution Commonly known as: PILOCAR Place 1 drop into the right eye 4 (four) times daily. Wait 10 minutes between drops. (Keep eyes closed for 2 minutes after each drop)   polyethylene glycol 17 g packet Commonly known as: MiraLax Take 17 g by mouth every other day.   Rhopressa 0.02 % Soln Generic drug: Netarsudil Dimesylate Apply 1 drop to eye every evening.   Vitamin D3 50 MCG (2000 UT) Tabs Take by mouth.        Review of Systems  Constitutional:  Negative for activity change, appetite change, chills, diaphoresis, fatigue, fever and  unexpected weight change.  HENT:  Negative for congestion.   Respiratory:  Negative for cough, shortness of breath and wheezing.   Cardiovascular:  Negative for chest pain, palpitations and leg swelling.  Gastrointestinal:  Negative for abdominal distention, abdominal pain, constipation and diarrhea.  Genitourinary:  Negative for difficulty urinating and dysuria.  Musculoskeletal:  Positive for gait problem. Negative for arthralgias, back pain, joint swelling and myalgias.  Neurological:  Negative for dizziness, tremors, seizures, syncope, facial asymmetry, speech difficulty, weakness, light-headedness, numbness and headaches.  Psychiatric/Behavioral:  Positive for confusion. Negative for agitation and behavioral problems.     Immunization History  Administered Date(s) Administered   Covid-19, Mrna,Vaccine(Spikevax)45yrs and older 10/08/2022   Influenza, High Dose Seasonal PF 09/26/2015, 09/03/2017, 08/28/2018, 09/23/2020, 09/07/2022   Influenza,inj,Quad PF,6+ Mos 10/01/2019   Influenza-Unspecified 09/22/2009, 08/27/2011, 10/08/2012, 12/08/2014, 02/10/2018, 09/06/2021   Moderna Covid-19 Vaccine Bivalent Booster 35yrs & up 04/19/2022   Moderna Sars-Covid-2 Vaccination 12/14/2019, 01/12/2020, 09/20/2020, 10/13/2020, 10/08/2022   Pneumococcal Conjugate-13 12/31/2013   Pneumococcal Polysaccharide-23 12/04/1999, 02/02/2016   Td 12/04/2000   Tdap 11/07/2012, 01/18/2023   Zoster Recombinant(Shingrix) 04/01/2022, 06/01/2022   Pertinent  Health Maintenance Due  Topic Date Due   INFLUENZA VACCINE  07/04/2023   DEXA SCAN  Completed      10/12/2022    3:54 AM 11/20/2022    2:03 PM 12/11/2022   11:10 AM 02/19/2023    2:40 PM 04/11/2023   11:32 AM  Fall Risk  Falls in the past year?  0 0 1 1  Was there an injury with Fall?  0 0 0 1  Fall Risk Category Calculator  0 0 1 2  Fall Risk Category (Retired)  Low Low    (RETIRED) Patient Fall Risk Level High fall risk Low fall risk Low fall risk     Patient at Risk for Falls Due to   No Fall Risks History of fall(s);Impaired balance/gait;Impaired mobility History of fall(s)  Fall risk Follow up  Falls evaluation completed Falls evaluation completed Falls evaluation completed;Education provided;Falls prevention discussed Falls evaluation completed   Functional Status Survey:    Vitals:   06/20/23 1433  BP: 117/66  Pulse: 69  Resp: 16  Temp: 97.8 F (36.6 C)  SpO2: 97%  Weight: 134 lb 9.6 oz (61.1 kg)  Height: 5\' 5"  (1.651 m)   Body mass index is 22.4 kg/m. Physical Exam Vitals and nursing note reviewed.  Constitutional:      General: She is not in acute distress.    Appearance: She is not diaphoretic.  HENT:     Head: Normocephalic and atraumatic.     Right Ear: Tympanic membrane normal.     Left Ear: Tympanic membrane normal.     Nose: Nose normal.     Mouth/Throat:     Mouth: Mucous membranes are moist.     Pharynx: Oropharynx is clear.  Eyes:     General:        Right eye: No discharge.        Left eye: No discharge.     Conjunctiva/sclera: Conjunctivae normal.     Pupils: Pupils are equal, round, and reactive to light.     Comments: Slight erythema to right sclera  Neck:     Vascular: No JVD.  Cardiovascular:     Rate and Rhythm: Normal rate and regular rhythm.     Heart sounds: No murmur heard. Pulmonary:     Effort: Pulmonary effort is normal. No respiratory distress.     Breath sounds: Normal breath sounds. No wheezing.  Abdominal:     General: Bowel sounds are normal. There is no distension.     Palpations: Abdomen is soft.     Tenderness: There is no abdominal tenderness.  Skin:    General: Skin is warm and dry.  Neurological:     General: No focal deficit present.     Mental Status: She is alert and oriented to person, place, and time. Mental status is at baseline.  Psychiatric:        Mood and Affect: Mood normal.     Labs reviewed: Recent Labs  10/12/22 0923  NA 140  K 3.7  CL 107   CO2 25  GLUCOSE 111*  BUN 13  CREATININE 0.81  CALCIUM 8.5*   No results for input(s): "AST", "ALT", "ALKPHOS", "BILITOT", "PROT", "ALBUMIN" in the last 8760 hours. Recent Labs    10/12/22 0923  WBC 9.5  HGB 12.0  HCT 36.2  MCV 96.0  PLT 142*   Lab Results  Component Value Date   TSH 1.91 11/21/2021   No results found for: "HGBA1C" Lab Results  Component Value Date   CHOL 241 (A) 09/10/2019   HDL 99 (A) 09/10/2019   LDLCALC 128 09/10/2019   TRIG 72 09/10/2019    Significant Diagnostic Results in last 30 days:  No results found.  Assessment/Plan  1. Mixed Alzheimer's and vascular dementia (HCC) Progressive decline in cognition and physical function c/w the disease. Continue supportive care in the skilled environment.  2. Slow transit constipation Continue miralax every other day   3. Primary open angle glaucoma of left eye, moderate stage Currently drops, f/u on ophthalmology visit  4. Essential hypertension Controlled without meds.   5. Multiple thyroid nodules Not pursuing further testing due to age and goals of care.   6. Senile osteoporosis Continues on fosamax Due to progressive dementia would likely not do repeat DEXA  7. Stage 3b chronic kidney disease (HCC) GFR 42 Continue to periodically monitor BMP and avoid nephrotoxic agents  8. Seizures  No new On Lamictal    Labs/tests ordered:  none ordered. CBC CMP done 5/17

## 2023-06-22 ENCOUNTER — Encounter: Payer: Self-pay | Admitting: Adult Health

## 2023-06-22 DIAGNOSIS — N1832 Chronic kidney disease, stage 3b: Secondary | ICD-10-CM | POA: Insufficient documentation

## 2023-06-29 DIAGNOSIS — G308 Other Alzheimer's disease: Secondary | ICD-10-CM | POA: Diagnosis not present

## 2023-06-29 DIAGNOSIS — R296 Repeated falls: Secondary | ICD-10-CM | POA: Diagnosis not present

## 2023-06-29 DIAGNOSIS — R278 Other lack of coordination: Secondary | ICD-10-CM | POA: Diagnosis not present

## 2023-06-29 DIAGNOSIS — M25542 Pain in joints of left hand: Secondary | ICD-10-CM | POA: Diagnosis not present

## 2023-06-29 DIAGNOSIS — M6389 Disorders of muscle in diseases classified elsewhere, multiple sites: Secondary | ICD-10-CM | POA: Diagnosis not present

## 2023-07-09 ENCOUNTER — Non-Acute Institutional Stay (SKILLED_NURSING_FACILITY): Payer: Medicare Other | Admitting: Internal Medicine

## 2023-07-09 ENCOUNTER — Encounter: Payer: Self-pay | Admitting: Internal Medicine

## 2023-07-09 DIAGNOSIS — E042 Nontoxic multinodular goiter: Secondary | ICD-10-CM

## 2023-07-09 DIAGNOSIS — M810 Age-related osteoporosis without current pathological fracture: Secondary | ICD-10-CM | POA: Diagnosis not present

## 2023-07-09 DIAGNOSIS — I10 Essential (primary) hypertension: Secondary | ICD-10-CM

## 2023-07-09 DIAGNOSIS — R569 Unspecified convulsions: Secondary | ICD-10-CM

## 2023-07-09 DIAGNOSIS — F0150 Vascular dementia without behavioral disturbance: Secondary | ICD-10-CM

## 2023-07-09 DIAGNOSIS — R2681 Unsteadiness on feet: Secondary | ICD-10-CM | POA: Diagnosis not present

## 2023-07-09 DIAGNOSIS — F0280 Dementia in other diseases classified elsewhere without behavioral disturbance: Secondary | ICD-10-CM

## 2023-07-09 DIAGNOSIS — G309 Alzheimer's disease, unspecified: Secondary | ICD-10-CM

## 2023-07-09 DIAGNOSIS — N1832 Chronic kidney disease, stage 3b: Secondary | ICD-10-CM | POA: Diagnosis not present

## 2023-07-09 NOTE — Progress Notes (Unsigned)
[facility] Room Number: 315A Place of Service:  SNF [phone]) Provider:  Mahlon Gammon, MD   Mahlon Gammon, MD  Patient Care Team: Mahlon Gammon, MD as PCP - General (Internal Medicine) York Spaniel, MD (Inactive) as Consulting Physician (Neurology)  Extended Emergency Contact Information Primary Emergency Contact: [facility] Home Phone: [phone] Mobile Phone: [phone] Relation: Relative Secondary Emergency Contact: wright,Twylla Mobile Phone: [phone] Relation: Daughter  Code Status:  DNR Goals of care: Advanced Directive information    07/09/2023    4:59 PM  Advanced Directives  Does Patient Have a Medical Advance Directive? Yes  Type of Estate agent of [place];Out of facility DNR (pink MOST or yellow form)  Does patient want to make changes to medical advance directive? No - Patient declined  Copy of Healthcare Power of Attorney in Chart? Yes - validated most recent copy scanned in chart (See row information)  Pre-existing out of facility DNR order (yellow form or pink MOST form) Yellow form placed in chart (order not valid for inpatient use)     Chief Complaint  Patient presents with   Medical Management of Chronic Issues    Patient is being seen for a routine visit    Immunizations    Patient is due for flu vaccine     HPI:  Pt is a 89 y.o. female seen today for medical management of chronic diseases.   Lives in Memory unit in [place]   Has h/o Alzheimer Dementia, h/o Seizure disorder and Osteoarthritis and Hypertension H/o Peripheral Neuropathy   Continues to be stable Now mostly uses her wheelchair Pleasantly confused Had no pain or any other complains Weight is stable No New Nursing issues Wt Readings from Last 3 Encounters:  07/09/23 133 lb 3.2 oz (60.4 kg)  06/20/23 134 lb 9.6 oz (61.1 kg)  05/14/23 133 lb 3.2 oz (60.4 kg)     Past Medical History:   Diagnosis Date   Arthritis    Per [facility] new patient packet    Epilepsy (HCC)    Per [facility] new patient packet    Glaucoma    Hearing deficit    Hearing aids   High blood pressure    Per [facility] new patient packet    History of abnormal weight loss    Low back pain    Memory difficulty 05/09/2016   MGUS (monoclonal gammopathy of unknown significance)    Neuropathy associated with MGUS (HCC) 11/09/2015   Radiculopathy of sacral and sacrococcygeal region    S1   Seizures (HCC)    Past Surgical History:  Procedure Laterality Date   APPENDECTOMY     BACK SURGERY     CATARACT EXTRACTION Left    OVARY SURGERY     PARATHYROIDECTOMY  9/   TRABECULECTOMY Left     Allergies  Allergen Reactions   Hctz [Hydrochlorothiazide]    Other Other (See Comments)    Caused sodium to drop    Outpatient Encounter Medications as of 07/09/2023  Medication Sig   acetaminophen (TYLENOL) 325 MG tablet Take 650 mg by mouth every 8 (eight) hours.   alendronate (FOSAMAX) 70 MG tablet Take 70 mg by mouth once a week. Take with a full glass of water on an empty stomach. Friday   bimatoprost (LUMIGAN) 0.01 % SOLN Place 1 drop into both eyes at bedtime.   brimonidine (ALPHAGAN) 0.2 % ophthalmic solution Place 1 drop into the right eye 3 (three) times daily.  Wait 10 minutes between drops. (Keep eyes closed for 2 minutes after each drop)   chlorhexidine (PERIDEX) 0.12 % solution Use as directed 5 mLs in the mouth or throat at bedtime. 1 tsp   Cholecalciferol (VITAMIN D3) 50 MCG (2000 UT) TABS Take by mouth.   cyanocobalamin 1000 MCG tablet Take 1,000 mcg by mouth in the morning.   diclofenac Sodium (VOLTAREN) 1 % GEL Apply 4 g topically as needed (Apply to left hip prn for pain or legft thumb pain).   docusate sodium (COLACE) 100 MG capsule Take 100 mg by mouth daily.   donepezil (ARICEPT) 10 MG tablet Take 10 mg by mouth at bedtime.   dorzolamide-timolol (COSOPT) 22.3-6.8 MG/ML ophthalmic solution Place 1 drop into  the right eye 2 (two) times daily. Wait 10 minutes between drops. (Keep eyes closed for 2 minutes after each drop)   lamoTRIgine (LAMICTAL) 100 MG tablet Take 1 tablet in morning, 1 and 1/2 tablets at night   memantine (NAMENDA) 5 MG tablet Take 10 mg by mouth 2 (two) times daily.   Multiple Vitamin (MULTIVITAMIN) tablet Take 1 tablet by mouth daily.   pilocarpine (PILOCAR) 4 % ophthalmic solution Place 1 drop into the right eye 4 (four) times daily. Wait 10 minutes between drops. (Keep eyes closed for 2 minutes after each drop)   polyethylene glycol (MIRALAX) 17 g packet Take 17 g by mouth every other day.   RHOPRESSA 0.02 % SOLN Apply 1 drop to eye every evening.   No facility-administered encounter medications on file as of 07/09/2023.    Review of Systems  Unable to perform ROS: Dementia    Immunization History  Administered Date(s) Administered   Covid-19, Mrna,Vaccine(Spikevax)2yrs and older 10/08/2022   Influenza, High Dose Seasonal PF 09/26/2015, 09/03/2017, 08/28/2018, 09/23/2020, 09/07/2022   Influenza,inj,Quad PF,6+ Mos 10/01/2019   Influenza-Unspecified 09/22/2009, 08/27/2011, 10/08/2012, 12/08/2014, 02/10/2018, 09/06/2021   Moderna Covid-19 Vaccine Bivalent Booster 68yrs & up 04/19/2022   Moderna Sars-Covid-2 Vaccination 12/14/2019, 01/12/2020, 09/20/2020, 10/13/2020, 10/08/2022   Pneumococcal Conjugate-13 12/31/2013   Pneumococcal Polysaccharide-23 12/04/1999, 02/02/2016   Td 12/04/2000   Tdap 11/07/2012, 01/18/2023   Zoster Recombinant(Shingrix) 04/01/2022, 06/01/2022   Pertinent  Health Maintenance Due  Topic Date Due   INFLUENZA VACCINE  07/04/2023   DEXA SCAN  Completed      10/12/2022    3:54 AM 11/20/2022    2:03 PM 12/11/2022   11:10 AM 02/19/2023    2:40 PM 04/11/2023   11:32 AM  Fall Risk  Falls in the past year?  0 0 1 1  Was there an injury with Fall?  0 0 0 1  Fall Risk Category Calculator  0 0 1 2  Fall Risk Category (Retired)  Low Low    (RETIRED)  Patient Fall Risk Level High fall risk Low fall risk Low fall risk    Patient at Risk for Falls Due to   No Fall Risks History of fall(s);Impaired balance/gait;Impaired mobility History of fall(s)  Fall risk Follow up  Falls evaluation completed Falls evaluation completed Falls evaluation completed;Education provided;Falls prevention discussed Falls evaluation completed   Functional Status Survey:    Vitals:   07/09/23 1510  BP: (!) 151/72  Pulse: 66  Resp: 20  Temp: (!) 95.9 F (35.5 C)  TempSrc: Temporal  SpO2: 94%  Weight: 133 lb 3.2 oz (60.4 kg)  Height: 5\' 5"  (1.651 m)   Body mass index is 22.17 kg/m. Physical Exam Vitals reviewed.  Constitutional:  Appearance: Normal appearance.  HENT:     Head: Normocephalic.     Nose: Nose normal.     Mouth/Throat:     Mouth: Mucous membranes are moist.     Pharynx: Oropharynx is clear.  Eyes:     Pupils: Pupils are equal, round, and reactive to light.  Cardiovascular:     Rate and Rhythm: Normal rate and regular rhythm.     Pulses: Normal pulses.     Heart sounds: Normal heart sounds. No murmur heard. Pulmonary:     Effort: Pulmonary effort is normal.     Breath sounds: Normal breath sounds.  Abdominal:     General: Abdomen is flat. Bowel sounds are normal.     Palpations: Abdomen is soft.  Musculoskeletal:        General: No swelling.     Cervical back: Neck supple.  Skin:    General: Skin is warm.  Neurological:     General: No focal deficit present.     Mental Status: She is alert.  Psychiatric:        Mood and Affect: Mood normal.        Thought Content: Thought content normal.     Labs reviewed: Recent Labs    10/12/22 0923  NA 140  K 3.7  CL 107  CO2 25  GLUCOSE 111*  BUN 13  CREATININE 0.81  CALCIUM 8.5*   No results for input(s): "AST", "ALT", "ALKPHOS", "BILITOT", "PROT", "ALBUMIN" in the last 8760 hours. Recent Labs    10/12/22 0923  WBC 9.5  HGB 12.0  HCT 36.2  MCV 96.0  PLT 142*    Lab Results  Component Value Date   TSH 1.91 11/21/2021   No results found for: "HGBA1C" Lab Results  Component Value Date   CHOL 241 (A) 09/10/2019   HDL 99 (A) 09/10/2019   LDLCALC 128 09/10/2019   TRIG 72 09/10/2019    Significant Diagnostic Results in last 30 days:  No results found.  Assessment/Plan 1. Mixed Alzheimer's and vascular dementia (HCC) Stable on Namenda and Aricept  2. Essential hypertension Off All meds  3. Multiple thyroid nodules No More follow up due to Goals of care  4. Senile osteoporosis On Fosamax since 05/22 T score -5.2 No Prolia due to cost  5. Seizure (HCC) Lamictal  6. Stage 3a chronic kidney disease (HCC) Some worsening recently Will continue to follow  7. Unstable gait Now uses Wheelchair mostly    Family/ staff Communication:   Labs/tests ordered:

## 2023-08-13 ENCOUNTER — Encounter: Payer: Self-pay | Admitting: Orthopedic Surgery

## 2023-08-13 ENCOUNTER — Non-Acute Institutional Stay (SKILLED_NURSING_FACILITY): Payer: Medicare Other | Admitting: Orthopedic Surgery

## 2023-08-13 DIAGNOSIS — R569 Unspecified convulsions: Secondary | ICD-10-CM

## 2023-08-13 DIAGNOSIS — F0150 Vascular dementia without behavioral disturbance: Secondary | ICD-10-CM

## 2023-08-13 DIAGNOSIS — H6123 Impacted cerumen, bilateral: Secondary | ICD-10-CM

## 2023-08-13 DIAGNOSIS — F0280 Dementia in other diseases classified elsewhere without behavioral disturbance: Secondary | ICD-10-CM

## 2023-08-13 DIAGNOSIS — E042 Nontoxic multinodular goiter: Secondary | ICD-10-CM

## 2023-08-13 DIAGNOSIS — N1832 Chronic kidney disease, stage 3b: Secondary | ICD-10-CM

## 2023-08-13 DIAGNOSIS — M810 Age-related osteoporosis without current pathological fracture: Secondary | ICD-10-CM

## 2023-08-13 DIAGNOSIS — G309 Alzheimer's disease, unspecified: Secondary | ICD-10-CM

## 2023-08-13 DIAGNOSIS — H401132 Primary open-angle glaucoma, bilateral, moderate stage: Secondary | ICD-10-CM | POA: Diagnosis not present

## 2023-08-13 MED ORDER — DEBROX 6.5 % OT SOLN
5.0000 [drp] | Freq: Two times a day (BID) | OTIC | Status: AC
Start: 2023-08-13 — End: 2023-08-18

## 2023-08-13 NOTE — Progress Notes (Signed)
Location:  [facility] Room Number: 315/A Place of Service:  SNF [phone]) Provider:  Octavia Heir, NP   Mahlon Gammon, MD  Patient Care Team: Mahlon Gammon, MD as PCP - General (Internal Medicine) York Spaniel, MD (Inactive) as Consulting Physician (Neurology)  Extended Emergency Contact Information Primary Emergency Contact: [facility] Home Phone: [phone] Mobile Phone: [phone] Relation: Relative Secondary Emergency Contact: wright,Cannie Mobile Phone: [phone] Relation: Daughter  Code Status:  DNR Goals of care: Advanced Directive information    07/09/2023    4:59 PM  Advanced Directives  Does Patient Have a Medical Advance Directive? Yes  Type of Estate agent of [place];Out of facility DNR (pink MOST or yellow form)  Does patient want to make changes to medical advance directive? No - Patient declined  Copy of Healthcare Power of Attorney in Chart? Yes - validated most recent copy scanned in chart (See row information)  Pre-existing out of facility DNR order (yellow form or pink MOST form) Yellow form placed in chart (order not valid for inpatient use)     Chief Complaint  Patient presents with   Medical Management of Chronic Issues    HPI:  Pt is a 89 y.o. female seen today for medical management of chronic diseases.    She currently resides on the memory care unit at [facility] due to AD. PMH: HTN, COPD, epilepsy, neuropathy, pseudogout,OA, osteoporosis, pelvic fracture 10/2022, open angle glaucoma.    Seizure- no recent episodes, remains on Lamictal  Alzheimer's dementia- no behaviors, MMSE 18/30 05/2023, dependent with ADLs except feeding, ambulates with wheelchair, remains on Aricept and Namenda CKD- BUN/creat 14/1.22, GFR 42 04/19/2023 Glaucoma- followed by ophthalmology, remains on brimonidine/bimatoprost/dorzolamide-timolol/pilocarpine  Thyroid nodules- 12/14/2022 thyroid  ultrasound with nodule 3.5 cm x 1.9 cm x 1.4 cm (right lobe)>fine needle aspiration recommended> HPOA does not want aggressive treatment, TSH 1.91/ T4 5.8 11/2021  Osteoporosis- DEXA 2021, t score -5.2, remains on Fosamax  Recent weights:  09/01- 132.8 lbs  08/01- 133.2 lbs  07/01- 134.6 lbs  Recent blood pressures:  08/28- 112/68  08/21- 149/81  08/14- 128/64     Past Medical History:  Diagnosis Date   Arthritis    Per [facility] new patient packet    Epilepsy (HCC)    Per [facility] new patient packet    Glaucoma    Hearing deficit    Hearing aids   High blood pressure    Per [facility] new patient packet    History of abnormal weight loss    Low back pain    Memory difficulty 05/09/2016   MGUS (monoclonal gammopathy of unknown significance)    Neuropathy associated with MGUS (HCC) 11/09/2015   Radiculopathy of sacral and sacrococcygeal region    S1   Seizures (HCC)    Past Surgical History:  Procedure Laterality Date   APPENDECTOMY     BACK SURGERY     CATARACT EXTRACTION Left    OVARY SURGERY     PARATHYROIDECTOMY  9/   TRABECULECTOMY Left     Allergies  Allergen Reactions   Hctz [Hydrochlorothiazide]    Other Other (See Comments)    Caused sodium to drop    Outpatient Encounter Medications as of 08/13/2023  Medication Sig   acetaminophen (TYLENOL) 325 MG tablet Take 650 mg by mouth every 8 (eight) hours.   alendronate (FOSAMAX) 70 MG tablet Take 70 mg by mouth once a week. Take with a full glass of water on an  empty stomach. Friday   bimatoprost (LUMIGAN) 0.01 % SOLN Place 1 drop into both eyes at bedtime.   brimonidine (ALPHAGAN) 0.2 % ophthalmic solution Place 1 drop into the right eye 3 (three) times daily. Wait 10 minutes between drops. (Keep eyes closed for 2 minutes after each drop)   chlorhexidine (PERIDEX) 0.12 % solution Use as directed 5 mLs in the mouth or throat at bedtime. 1 tsp   Cholecalciferol (VITAMIN D3) 50 MCG (2000 UT) TABS Take by mouth.   cyanocobalamin  1000 MCG tablet Take 1,000 mcg by mouth in the morning.   diclofenac Sodium (VOLTAREN) 1 % GEL Apply 4 g topically as needed (Apply to left hip prn for pain or legft thumb pain).   docusate sodium (COLACE) 100 MG capsule Take 100 mg by mouth daily.   donepezil (ARICEPT) 10 MG tablet Take 10 mg by mouth at bedtime.   dorzolamide-timolol (COSOPT) 22.3-6.8 MG/ML ophthalmic solution Place 1 drop into the right eye 2 (two) times daily. Wait 10 minutes between drops. (Keep eyes closed for 2 minutes after each drop)   lamoTRIgine (LAMICTAL) 100 MG tablet Take 1 tablet in morning, 1 and 1/2 tablets at night   memantine (NAMENDA) 5 MG tablet Take 10 mg by mouth 2 (two) times daily.   Multiple Vitamin (MULTIVITAMIN) tablet Take 1 tablet by mouth daily.   pilocarpine (PILOCAR) 4 % ophthalmic solution Place 1 drop into the right eye 4 (four) times daily. Wait 10 minutes between drops. (Keep eyes closed for 2 minutes after each drop)   polyethylene glycol (MIRALAX) 17 g packet Take 17 g by mouth every other day.   RHOPRESSA 0.02 % SOLN Apply 1 drop to eye every evening.   No facility-administered encounter medications on file as of 08/13/2023.    Review of Systems  Unable to perform ROS: Dementia    Immunization History  Administered Date(s) Administered   Covid-19, Mrna,Vaccine(Spikevax)17yrs and older 10/08/2022   Influenza, High Dose Seasonal PF 09/26/2015, 09/03/2017, 08/28/2018, 09/23/2020, 09/07/2022   Influenza,inj,Quad PF,6+ Mos 10/01/2019   Influenza-Unspecified 09/22/2009, 08/27/2011, 10/08/2012, 12/08/2014, 02/10/2018, 09/06/2021   Moderna Covid-19 Vaccine Bivalent Booster 75yrs & up 04/19/2022   Moderna Sars-Covid-2 Vaccination 12/14/2019, 01/12/2020, 09/20/2020, 10/13/2020, 10/08/2022   Pneumococcal Conjugate-13 12/31/2013   Pneumococcal Polysaccharide-23 12/04/1999, 02/02/2016   Td 12/04/2000   Tdap 11/07/2012, 01/18/2023   Zoster Recombinant(Shingrix) 04/01/2022, 06/01/2022    Pertinent  Health Maintenance Due  Topic Date Due   INFLUENZA VACCINE  07/04/2023   DEXA SCAN  Completed      10/12/2022    3:54 AM 11/20/2022    2:03 PM 12/11/2022   11:10 AM 02/19/2023    2:40 PM 04/11/2023   11:32 AM  Fall Risk  Falls in the past year?  0 0 1 1  Was there an injury with Fall?  0 0 0 1  Fall Risk Category Calculator  0 0 1 2  Fall Risk Category (Retired)  Low Low    (RETIRED) Patient Fall Risk Level High fall risk Low fall risk Low fall risk    Patient at Risk for Falls Due to   No Fall Risks History of fall(s);Impaired balance/gait;Impaired mobility History of fall(s)  Fall risk Follow up  Falls evaluation completed Falls evaluation completed Falls evaluation completed;Education provided;Falls prevention discussed Falls evaluation completed   Functional Status Survey:    Vitals:   08/13/23 1224  BP: 112/68  Pulse: 87  Resp: 18  Temp: 98 F (36.7 C)  SpO2: 95%  Weight: 132 lb 12.8 oz (60.2 kg)  Height: 5\' 5"  (1.651 m)   Body mass index is 22.1 kg/m. Physical Exam Vitals reviewed.  Constitutional:      General: She is not in acute distress. HENT:     Head: Normocephalic.     Right Ear: There is impacted cerumen.     Left Ear: There is impacted cerumen.     Nose: Nose normal.     Mouth/Throat:     Mouth: Mucous membranes are moist.  Eyes:     General:        Right eye: No discharge.        Left eye: No discharge.  Cardiovascular:     Rate and Rhythm: Normal rate and regular rhythm.     Pulses: Normal pulses.     Heart sounds: Normal heart sounds.  Pulmonary:     Effort: Pulmonary effort is normal. No respiratory distress.     Breath sounds: Normal breath sounds. No wheezing.  Abdominal:     General: Bowel sounds are normal. There is no distension.     Palpations: Abdomen is soft.     Tenderness: There is no abdominal tenderness.  Musculoskeletal:     Cervical back: Neck supple.     Right lower leg: No edema.     Left lower leg: No  edema.  Skin:    General: Skin is warm.     Capillary Refill: Capillary refill takes less than 2 seconds.  Neurological:     General: No focal deficit present.     Mental Status: She is alert. Mental status is at baseline.     Motor: Weakness present.     Gait: Gait abnormal.     Comments: wheelchair  Psychiatric:        Mood and Affect: Mood normal.     Comments: Alert to self/place, follows commands     Labs reviewed: Recent Labs    10/12/22 0923  NA 140  K 3.7  CL 107  CO2 25  GLUCOSE 111*  BUN 13  CREATININE 0.81  CALCIUM 8.5*   No results for input(s): "AST", "ALT", "ALKPHOS", "BILITOT", "PROT", "ALBUMIN" in the last 8760 hours. Recent Labs    10/12/22 0923  WBC 9.5  HGB 12.0  HCT 36.2  MCV 96.0  PLT 142*   Lab Results  Component Value Date   TSH 1.91 11/21/2021   No results found for: "HGBA1C" Lab Results  Component Value Date   CHOL 241 (A) 09/10/2019   HDL 99 (A) 09/10/2019   LDLCALC 128 09/10/2019   TRIG 72 09/10/2019    Significant Diagnostic Results in last 30 days:  No results found.  Assessment/Plan 1. Bilateral impacted cerumen - flush ears when Debrox compete - carbamide peroxide (DEBROX) 6.5 % OTIC solution; Place 5 drops into both ears 2 (two) times daily for 5 days.  2. Seizure (HCC) - no recent episodes - cont Lamictal  3. Mixed Alzheimer's and vascular dementia (HCC) - no behaviors - dependent with ADLs except feeding - weights stable - ambulates with wheelchair - cont Aricept and Namenda  4. Stage 3b chronic kidney disease (HCC) - encourage hydration with water - avoid NSAIDS  5. Primary open angle glaucoma (POAG) of both eyes, moderate stage - cont brimonidine/bimatoprost/dorzolamide-timolol/pilocarpine   6. Multiple thyroid nodules - no further workup per goals of care - TSH 1.91 2022  7. Senile osteoporosis - DEXA 2021, t score -5.2 - cont Fosamax  Family/ staff Communication: plan discussed with patient  and nurse  Labs/tests ordered:  none

## 2023-08-19 DIAGNOSIS — H401133 Primary open-angle glaucoma, bilateral, severe stage: Secondary | ICD-10-CM | POA: Diagnosis not present

## 2023-09-03 ENCOUNTER — Encounter: Payer: Self-pay | Admitting: Orthopedic Surgery

## 2023-09-03 ENCOUNTER — Non-Acute Institutional Stay (SKILLED_NURSING_FACILITY): Payer: Medicare Other | Admitting: Orthopedic Surgery

## 2023-09-03 DIAGNOSIS — E042 Nontoxic multinodular goiter: Secondary | ICD-10-CM

## 2023-09-03 DIAGNOSIS — R569 Unspecified convulsions: Secondary | ICD-10-CM | POA: Diagnosis not present

## 2023-09-03 DIAGNOSIS — K5901 Slow transit constipation: Secondary | ICD-10-CM

## 2023-09-03 DIAGNOSIS — F0150 Vascular dementia without behavioral disturbance: Secondary | ICD-10-CM

## 2023-09-03 DIAGNOSIS — G309 Alzheimer's disease, unspecified: Secondary | ICD-10-CM | POA: Diagnosis not present

## 2023-09-03 DIAGNOSIS — M810 Age-related osteoporosis without current pathological fracture: Secondary | ICD-10-CM | POA: Diagnosis not present

## 2023-09-03 DIAGNOSIS — N1832 Chronic kidney disease, stage 3b: Secondary | ICD-10-CM | POA: Diagnosis not present

## 2023-09-03 DIAGNOSIS — H401132 Primary open-angle glaucoma, bilateral, moderate stage: Secondary | ICD-10-CM | POA: Diagnosis not present

## 2023-09-03 DIAGNOSIS — F0280 Dementia in other diseases classified elsewhere without behavioral disturbance: Secondary | ICD-10-CM

## 2023-09-03 NOTE — Progress Notes (Signed)
Location:  [facility] Room Number: 315/A Place of Service:  SNF [phone]) Provider:  Octavia Heir, NP   Mahlon Gammon, MD  Patient Care Team: Mahlon Gammon, MD as PCP - General (Internal Medicine) York Spaniel, MD (Inactive) as Consulting Physician (Neurology)  Extended Emergency Contact Information Primary Emergency Contact: [facility] Home Phone: [phone] Mobile Phone: [phone] Relation: Relative Secondary Emergency Contact: wright,Lenor Mobile Phone: [phone] Relation: Daughter  Code Status:  DNR Goals of care: Advanced Directive information    07/09/2023    4:59 PM  Advanced Directives  Does Patient Have a Medical Advance Directive? Yes  Type of Estate agent of [place];Out of facility DNR (pink MOST or yellow form)  Does patient want to make changes to medical advance directive? No - Patient declined  Copy of Healthcare Power of Attorney in Chart? Yes - validated most recent copy scanned in chart (See row information)  Pre-existing out of facility DNR order (yellow form or pink MOST form) Yellow form placed in chart (order not valid for inpatient use)     Chief Complaint  Patient presents with   Medical Management of Chronic Issues    HPI:  Pt is a 89 y.o. female seen today for medical management of chronic diseases.    She currently resides on the memory care unit at [facility] due to AD. PMH: HTN, COPD, epilepsy, neuropathy, pseudogout,OA, osteoporosis, pelvic fracture 10/2022, open angle glaucoma.    Seizure- no recent episodes, remains on Lamictal  Alzheimer's dementia- no behaviors, MMSE 18/30 05/2023, dependent with ADLs except feeding, ambulates with wheelchair, remains on Aricept and Namenda CKD- BUN/creat 14/1.22, GFR 42 04/19/2023 Glaucoma- followed by ophthalmology, remains on brimonidine/bimatoprost/dorzolamide-timolol/pilocarpine  Thyroid nodules- 12/14/2022 thyroid  ultrasound with nodule 3.5 cm x 1.9 cm x 1.4 cm (right lobe)>fine needle aspiration recommended> HPOA does not want aggressive treatment, TSH 1.91/ T4 5.8 11/2021  Osteoporosis- DEXA 2021, t score -5.2, remains on Fosamax Constipation- LBM 09/29, remains on Miralax and colace  No recent falls or injuries.   Recent blood pressures:  09/25- 132/77  09/18- 131/70  09/11- 126/75  Recent weights:  10/01- 132.2 lbs  09/01- 132.8 lbs  08/01- 133.8 lbs   Past Medical History:  Diagnosis Date   Arthritis    Per [facility] new patient packet    Epilepsy (HCC)    Per [facility] new patient packet    Glaucoma    Hearing deficit    Hearing aids   High blood pressure    Per [facility] new patient packet    History of abnormal weight loss    Low back pain    Memory difficulty 05/09/2016   MGUS (monoclonal gammopathy of unknown significance)    Neuropathy associated with MGUS (HCC) 11/09/2015   Radiculopathy of sacral and sacrococcygeal region    S1   Seizures (HCC)    Past Surgical History:  Procedure Laterality Date   APPENDECTOMY     BACK SURGERY     CATARACT EXTRACTION Left    OVARY SURGERY     PARATHYROIDECTOMY  9/   TRABECULECTOMY Left     Allergies  Allergen Reactions   Hctz [Hydrochlorothiazide]    Other Other (See Comments)    Caused sodium to drop    Outpatient Encounter Medications as of 09/03/2023  Medication Sig   acetaminophen (TYLENOL) 325 MG tablet Take 650 mg by mouth every 8 (eight) hours.   alendronate (FOSAMAX) 70 MG tablet Take 70 mg by  mouth once a week. Take with a full glass of water on an empty stomach. Friday   bimatoprost (LUMIGAN) 0.01 % SOLN Place 1 drop into both eyes at bedtime.   brimonidine (ALPHAGAN) 0.2 % ophthalmic solution Place 1 drop into the right eye 3 (three) times daily. Wait 10 minutes between drops. (Keep eyes closed for 2 minutes after each drop)   chlorhexidine (PERIDEX) 0.12 % solution Use as directed 5 mLs in the mouth or throat at bedtime. 1 tsp    Cholecalciferol (VITAMIN D3) 50 MCG (2000 UT) TABS Take by mouth.   cyanocobalamin 1000 MCG tablet Take 1,000 mcg by mouth in the morning.   diclofenac Sodium (VOLTAREN) 1 % GEL Apply 4 g topically as needed (Apply to left hip prn for pain or legft thumb pain).   docusate sodium (COLACE) 100 MG capsule Take 100 mg by mouth daily.   donepezil (ARICEPT) 10 MG tablet Take 10 mg by mouth at bedtime.   dorzolamide-timolol (COSOPT) 22.3-6.8 MG/ML ophthalmic solution Place 1 drop into the right eye 2 (two) times daily. Wait 10 minutes between drops. (Keep eyes closed for 2 minutes after each drop)   lamoTRIgine (LAMICTAL) 100 MG tablet Take 1 tablet in morning, 1 and 1/2 tablets at night   memantine (NAMENDA) 5 MG tablet Take 10 mg by mouth 2 (two) times daily.   Multiple Vitamin (MULTIVITAMIN) tablet Take 1 tablet by mouth daily.   pilocarpine (PILOCAR) 4 % ophthalmic solution Place 1 drop into the right eye 4 (four) times daily. Wait 10 minutes between drops. (Keep eyes closed for 2 minutes after each drop)   polyethylene glycol (MIRALAX) 17 g packet Take 17 g by mouth every other day.   RHOPRESSA 0.02 % SOLN Apply 1 drop to eye every evening.   No facility-administered encounter medications on file as of 09/03/2023.    Review of Systems  Unable to perform ROS: Dementia    Immunization History  Administered Date(s) Administered   Influenza, High Dose Seasonal PF 09/26/2015, 09/03/2017, 08/28/2018, 09/23/2020, 09/07/2022   Influenza,inj,Quad PF,6+ Mos 10/01/2019   Influenza-Unspecified 09/22/2009, 08/27/2011, 10/08/2012, 12/08/2014, 02/10/2018, 09/06/2021   Moderna Covid-19 Fall Seasonal Vaccine 37yrs & older 10/08/2022   Moderna Covid-19 Vaccine Bivalent Booster 23yrs & up 04/19/2022   Moderna Sars-Covid-2 Vaccination 12/14/2019, 01/12/2020, 09/20/2020, 10/13/2020, 10/08/2022   Pneumococcal Conjugate-13 12/31/2013   Pneumococcal Polysaccharide-23 12/04/1999, 02/02/2016   Td 12/04/2000   Tdap  11/07/2012, 01/18/2023   Zoster Recombinant(Shingrix) 04/01/2022, 06/01/2022   Pertinent  Health Maintenance Due  Topic Date Due   INFLUENZA VACCINE  07/04/2023   DEXA SCAN  Completed      11/20/2022    2:03 PM 12/11/2022   11:10 AM 02/19/2023    2:40 PM 04/11/2023   11:32 AM 08/13/2023   12:36 PM  Fall Risk  Falls in the past year? 0 0 1 1 1   Was there an injury with Fall? 0 0 0 1 1  Fall Risk Category Calculator 0 0 1 2 2   Fall Risk Category (Retired) Low Low     (RETIRED) Patient Fall Risk Level Low fall risk Low fall risk     Patient at Risk for Falls Due to  No Fall Risks History of fall(s);Impaired balance/gait;Impaired mobility History of fall(s) History of fall(s);Impaired balance/gait  Fall risk Follow up Falls evaluation completed Falls evaluation completed Falls evaluation completed;Education provided;Falls prevention discussed Falls evaluation completed Falls evaluation completed;Education provided;Falls prevention discussed   Functional Status Survey:    Vitals:  09/03/23 1211  BP: 132/77  Pulse: 79  Resp: 18  Temp: 97.8 F (36.6 C)  SpO2: 95%  Weight: 132 lb 3.2 oz (60 kg)  Height: 5\' 5"  (1.651 m)   Body mass index is 22 kg/m. Physical Exam Vitals reviewed.  Constitutional:      General: She is not in acute distress. HENT:     Head: Normocephalic.     Right Ear: There is no impacted cerumen.     Left Ear: There is no impacted cerumen.     Nose: Nose normal.     Mouth/Throat:     Mouth: Mucous membranes are moist.  Eyes:     General:        Right eye: No discharge.        Left eye: No discharge.  Cardiovascular:     Rate and Rhythm: Normal rate and regular rhythm.     Pulses: Normal pulses.     Heart sounds: Normal heart sounds.  Pulmonary:     Effort: Pulmonary effort is normal. No respiratory distress.     Breath sounds: Normal breath sounds. No wheezing or rales.  Abdominal:     General: Bowel sounds are normal.     Palpations: Abdomen is  soft.  Musculoskeletal:     Cervical back: Neck supple.     Right lower leg: No edema.     Left lower leg: No edema.  Skin:    General: Skin is warm.     Capillary Refill: Capillary refill takes less than 2 seconds.  Neurological:     General: No focal deficit present.     Mental Status: She is alert. Mental status is at baseline.     Motor: Weakness present.     Gait: Gait abnormal.     Comments: wheelchair  Psychiatric:        Mood and Affect: Mood normal.     Comments: Follows commands, alert to self/familiar face     Labs reviewed: Recent Labs    10/12/22 0923  NA 140  K 3.7  CL 107  CO2 25  GLUCOSE 111*  BUN 13  CREATININE 0.81  CALCIUM 8.5*   No results for input(s): "AST", "ALT", "ALKPHOS", "BILITOT", "PROT", "ALBUMIN" in the last 8760 hours. Recent Labs    10/12/22 0923  WBC 9.5  HGB 12.0  HCT 36.2  MCV 96.0  PLT 142*   Lab Results  Component Value Date   TSH 1.91 11/21/2021   No results found for: "HGBA1C" Lab Results  Component Value Date   CHOL 241 (A) 09/10/2019   HDL 99 (A) 09/10/2019   LDLCALC 128 09/10/2019   TRIG 72 09/10/2019    Significant Diagnostic Results in last 30 days:  No results found.  Assessment/Plan 1. Seizure (HCC) - no recent episodes - cont Lamictal  2. Mixed Alzheimer's and vascular dementia (HCC) - no behaviors - doing well in memory care - dependent with ADLs except feeding - ambulates with wheelchair - cont Namenda  3. Stage 3b chronic kidney disease (HCC) - encourage hydration with water - avoid NSAIDS  4. Primary open angle glaucoma (POAG) of both eyes, moderate stage - cont brimonidine,bimatoprost,dorzolamide-timolol,pilocarpine   5. Multiple thyroid nodules - no aggressive workup per goals of care - TSH 1.91 11/2021  6. Senile osteoporosis - DEXA 2021, t score -5.2 (right forearm), -3.0 (right femur)  - cont Fosamax  7. Slow transit constipation - LBM 09/29 - abdomen soft - cont miralax  and  colace    Family/ staff Communication: plan discussed with patient and nurse  Labs/tests ordered:  none

## 2023-10-07 ENCOUNTER — Non-Acute Institutional Stay (SKILLED_NURSING_FACILITY): Payer: Medicare Other | Admitting: Internal Medicine

## 2023-10-07 ENCOUNTER — Encounter: Payer: Self-pay | Admitting: Internal Medicine

## 2023-10-07 DIAGNOSIS — K5901 Slow transit constipation: Secondary | ICD-10-CM | POA: Diagnosis not present

## 2023-10-07 DIAGNOSIS — M810 Age-related osteoporosis without current pathological fracture: Secondary | ICD-10-CM | POA: Diagnosis not present

## 2023-10-07 DIAGNOSIS — R569 Unspecified convulsions: Secondary | ICD-10-CM | POA: Diagnosis not present

## 2023-10-07 DIAGNOSIS — N1832 Chronic kidney disease, stage 3b: Secondary | ICD-10-CM | POA: Diagnosis not present

## 2023-10-07 DIAGNOSIS — G309 Alzheimer's disease, unspecified: Secondary | ICD-10-CM | POA: Diagnosis not present

## 2023-10-07 DIAGNOSIS — I10 Essential (primary) hypertension: Secondary | ICD-10-CM

## 2023-10-07 DIAGNOSIS — F0280 Dementia in other diseases classified elsewhere without behavioral disturbance: Secondary | ICD-10-CM

## 2023-10-07 DIAGNOSIS — R2681 Unsteadiness on feet: Secondary | ICD-10-CM | POA: Diagnosis not present

## 2023-10-07 DIAGNOSIS — E042 Nontoxic multinodular goiter: Secondary | ICD-10-CM | POA: Diagnosis not present

## 2023-10-07 DIAGNOSIS — F0150 Vascular dementia without behavioral disturbance: Secondary | ICD-10-CM

## 2023-10-07 NOTE — Progress Notes (Unsigned)
Location:  [facility] illustrator of Service:  SNF ([id number])  Provider:   Code Status: DNR Goals of Care:     07/09/2023    4:59 PM  Advanced Directives  Does Patient Have a Medical Advance Directive? Yes  Type of Estate agent of [place];Out of facility DNR (pink MOST or yellow form)  Does patient want to make changes to medical advance directive? No - Patient declined  Copy of Healthcare Power of Attorney in Chart? Yes - validated most recent copy scanned in chart (See row information)  Pre-existing out of facility DNR order (yellow form or pink MOST form) Yellow form placed in chart (order not valid for inpatient use)     Chief Complaint  Patient presents with   Care Management    HPI: Patient is a 89 y.o. female seen today for medical management of chronic diseases.   Lives in Memory unit in [place]   Has h/o Alzheimer Dementia, h/o Seizure disorder and Osteoarthritis and Hypertension H/o Peripheral Neuropathy    She is stable. No new Nursing issues. No Behavior issues Her weight is stable Walks with her walker No Falls Wt Readings from Last 3 Encounters:  10/07/23 131 lb (59.4 kg)  09/03/23 132 lb 3.2 oz (60 kg)  08/13/23 132 lb 12.8 oz (60.2 kg)    Past Medical History:  Diagnosis Date   Arthritis    Per [facility] new patient packet    Epilepsy (HCC)    Per [facility] new patient packet    Glaucoma    Hearing deficit    Hearing aids   High blood pressure    Per [facility] new patient packet    History of abnormal weight loss    Low back pain    Memory difficulty 05/09/2016   MGUS (monoclonal gammopathy of unknown significance)    Neuropathy associated with MGUS (HCC) 11/09/2015   Radiculopathy of sacral and sacrococcygeal region    S1   Seizures (HCC)     Past Surgical History:  Procedure Laterality Date   APPENDECTOMY     BACK SURGERY     CATARACT EXTRACTION Left    OVARY SURGERY     PARATHYROIDECTOMY  9/   TRABECULECTOMY Left      Allergies  Allergen Reactions   Hctz [Hydrochlorothiazide]    Other Other (See Comments)    Caused sodium to drop    Outpatient Encounter Medications as of 10/07/2023  Medication Sig   acetaminophen (TYLENOL) 325 MG tablet Take 650 mg by mouth every 8 (eight) hours.   alendronate (FOSAMAX) 70 MG tablet Take 70 mg by mouth once a week. Take with a full glass of water on an empty stomach. Friday   bimatoprost (LUMIGAN) 0.01 % SOLN Place 1 drop into both eyes at bedtime.   brimonidine (ALPHAGAN) 0.2 % ophthalmic solution Place 1 drop into the right eye 3 (three) times daily. Wait 10 minutes between drops. (Keep eyes closed for 2 minutes after each drop)   chlorhexidine (PERIDEX) 0.12 % solution Use as directed 5 mLs in the mouth or throat at bedtime. 1 tsp   Cholecalciferol (VITAMIN D3) 50 MCG (2000 UT) TABS Take by mouth.   cyanocobalamin 1000 MCG tablet Take 1,000 mcg by mouth in the morning.   diclofenac Sodium (VOLTAREN) 1 % GEL Apply 4 g topically as needed (Apply to left hip prn for pain or legft thumb pain).   docusate sodium (COLACE) 100 MG capsule Take 100 mg  by mouth daily.   donepezil (ARICEPT) 10 MG tablet Take 10 mg by mouth at bedtime.   dorzolamide-timolol (COSOPT) 22.3-6.8 MG/ML ophthalmic solution Place 1 drop into the right eye 2 (two) times daily. Wait 10 minutes between drops. (Keep eyes closed for 2 minutes after each drop)   lamoTRIgine (LAMICTAL) 100 MG tablet Take 1 tablet in morning, 1 and 1/2 tablets at night   memantine (NAMENDA) 5 MG tablet Take 10 mg by mouth 2 (two) times daily.   Multiple Vitamin (MULTIVITAMIN) tablet Take 1 tablet by mouth daily.   pilocarpine (PILOCAR) 4 % ophthalmic solution Place 1 drop into the right eye 4 (four) times daily. Wait 10 minutes between drops. (Keep eyes closed for 2 minutes after each drop)   polyethylene glycol (MIRALAX) 17 g packet Take 17 g by mouth every other day.   RHOPRESSA 0.02 % SOLN Apply 1 drop to eye every  evening.   No facility-administered encounter medications on file as of 10/07/2023.    Review of Systems:  Review of Systems  Unable to perform ROS: Dementia    Health Maintenance  Topic Date Due   INFLUENZA VACCINE  07/04/2023   COVID-19 Vaccine (6 - 2023-24 season) 08/04/2023   Medicare Annual Wellness (AWV)  04/10/2024   DTaP/Tdap/Td (4 - Td or Tdap) 01/18/2033   Pneumonia Vaccine 65+ Years old  Completed   DEXA SCAN  Completed   Zoster Vaccines- Shingrix  Completed   HPV VACCINES  Aged Out    Physical Exam: Vitals:   10/07/23 1608  BP: 117/67  Pulse: 71  Resp: 18  Temp: 98.1 F (36.7 C)  Weight: 131 lb (59.4 kg)   Body mass index is 21.8 kg/m. Physical Exam Vitals reviewed.  Constitutional:      Appearance: Normal appearance.  HENT:     Head: Normocephalic.     Nose: Nose normal.     Mouth/Throat:     Mouth: Mucous membranes are moist.     Pharynx: Oropharynx is clear.  Eyes:     Pupils: Pupils are equal, round, and reactive to light.  Cardiovascular:     Rate and Rhythm: Normal rate and regular rhythm.     Pulses: Normal pulses.     Heart sounds: Normal heart sounds. No murmur heard. Pulmonary:     Effort: Pulmonary effort is normal.     Breath sounds: Normal breath sounds.  Abdominal:     General: Abdomen is flat. Bowel sounds are normal.     Palpations: Abdomen is soft.  Musculoskeletal:        General: Swelling present.     Cervical back: Neck supple.  Skin:    General: Skin is warm.  Neurological:     General: No focal deficit present.     Mental Status: She is alert.  Psychiatric:        Mood and Affect: Mood normal.        Thought Content: Thought content normal.     Labs reviewed: Basic Metabolic Panel: Recent Labs    10/12/22 0923  NA 140  K 3.7  CL 107  CO2 25  GLUCOSE 111*  BUN 13  CREATININE 0.81  CALCIUM 8.5*   Liver Function Tests: No results for input(s): "AST", "ALT", "ALKPHOS", "BILITOT", "PROT", "ALBUMIN" in the  last 8760 hours. No results for input(s): "LIPASE", "AMYLASE" in the last 8760 hours. No results for input(s): "AMMONIA" in the last 8760 hours. CBC: Recent Labs    10/12/22 [phone]  WBC 9.5  HGB 12.0  HCT 36.2  MCV 96.0  PLT 142*   Lipid Panel: No results for input(s): "CHOL", "HDL", "LDLCALC", "TRIG", "CHOLHDL", "LDLDIRECT" in the last 8760 hours. No results found for: "HGBA1C"  Procedures since last visit: No results found.  Assessment/Plan 1. Seizure (HCC) Lamichtal  2. Mixed Alzheimer's and vascular dementia (HCC) Aricept and Namenda In SNF ADL care  3. Stage 3b chronic kidney disease (HCC) Creat some worsening But will follow  4. Multiple thyroid nodules No More Follow up due to her age and Frailty  5. Senile osteoporosis On Fosamax since 05/22 T score -5.2 No Prolia due to cost  6. Slow transit constipation Miralax  7. Unstable gait Uses Wheelchair and Walker  8. Essential hypertension Off All Meds    Labs/tests ordered:  * No order type specified * Next appt:  Visit date not found

## 2023-10-18 DIAGNOSIS — H401133 Primary open-angle glaucoma, bilateral, severe stage: Secondary | ICD-10-CM | POA: Diagnosis not present

## 2023-10-28 ENCOUNTER — Encounter: Payer: Self-pay | Admitting: Adult Health

## 2023-10-28 ENCOUNTER — Non-Acute Institutional Stay (SKILLED_NURSING_FACILITY): Payer: Medicare Other | Admitting: Adult Health

## 2023-10-28 DIAGNOSIS — M25522 Pain in left elbow: Secondary | ICD-10-CM

## 2023-10-28 NOTE — Progress Notes (Unsigned)
Location:  [facility] Room Number: 315A Place of Service:  SNF [phone]) Provider:  Tamsen Roers, MD  Patient Care Team: Mahlon Gammon, MD as PCP - General (Internal Medicine) York Spaniel, MD (Inactive) as Consulting Physician (Neurology)  Extended Emergency Contact Information Primary Emergency Contact: [facility] Home Phone: [phone] Mobile Phone: [phone] Relation: Relative Secondary Emergency Contact: wright,Katelyn Mobile Phone: [phone] Relation: Daughter  Code Status:  NR Goals of care: Advanced Directive information    10/28/2023   11:32 AM  Advanced Directives  Does Patient Have a Medical Advance Directive? Yes  Type of Estate agent of [place];Out of facility DNR (pink MOST or yellow form)  Does patient want to make changes to medical advance directive? No - Patient declined  Copy of Healthcare Power of Attorney in Chart? Yes - validated most recent copy scanned in chart (See row information)  Pre-existing out of facility DNR order (yellow form or pink MOST form) Yellow form placed in chart (order not valid for inpatient use)     Chief Complaint  Patient presents with   Acute Visit    Patient is being seen for acute elbow     HPI:  Pt is a 89 y.o. female seen today for an acute visit for left elbow pain.   The nurse reports this am she has acute pain, tenderness, mild warmth, and swelling.   No reports of a fall or injury. No wounds or drainage. She did have one low grade temp of 100.  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   alendronate (FOSAMAX) 70 MG tablet Take 70 mg by mouth once a week. Take with a full glass of water on an empty stomach. Friday   bimatoprost (LUMIGAN) 0.01 % SOLN Place 1 drop into both eyes at bedtime.   brimonidine (ALPHAGAN) 0.2 % ophthalmic solution Place 1 drop into the right eye 3 (three) times daily. Wait 10 minutes between drops. (Keep eyes closed for 2 minutes after each drop)   chlorhexidine (PERIDEX) 0.12 % solution Use as directed 5 mLs in the mouth or throat at bedtime. 1 tsp   Cholecalciferol (VITAMIN D3) 50 MCG (2000 UT) TABS Take by mouth.   cyanocobalamin 1000 MCG tablet Take  1,000 mcg by mouth in the morning.   docusate sodium (COLACE) 100 MG capsule Take 100 mg by mouth daily.   donepezil (ARICEPT) 10 MG tablet Take 10 mg by mouth at bedtime.   dorzolamide-timolol (COSOPT) 22.3-6.8 MG/ML ophthalmic solution Place 1 drop into the right eye 2 (two) times daily. Wait 10 minutes between drops. (Keep eyes closed for 2 minutes after each drop)   lamoTRIgine (LAMICTAL) 100 MG tablet Take 1 tablet in morning, 1 and 1/2 tablets at night   memantine (NAMENDA) 5 MG tablet Take 10 mg by mouth 2 (two) times daily.   Multiple Vitamin (MULTIVITAMIN) tablet Take 1 tablet by mouth daily.   pilocarpine (PILOCAR) 4 % ophthalmic solution Place 1 drop into the right eye 4  (four) times daily. Wait 10 minutes between drops. (Keep eyes closed for 2 minutes after each drop)   polyethylene glycol (MIRALAX) 17 g packet Take 17 g by mouth every other day.   RHOPRESSA 0.02 % SOLN Apply 1 drop to eye every evening.   diclofenac Sodium (VOLTAREN) 1 % GEL Apply 4 g topically as needed (Apply to left hip prn for pain or legft thumb pain). (Patient not taking: Reported on 10/28/2023)   No facility-administered encounter medications on file as of 10/28/2023.    Review of Systems  Constitutional:  Negative for activity change, appetite change, chills, diaphoresis, fatigue, fever and unexpected weight change.  HENT:  Negative for congestion.   Respiratory:  Negative for cough, shortness of breath and wheezing.   Cardiovascular:  Negative for chest pain, palpitations and leg swelling.  Gastrointestinal:  Negative for abdominal distention, abdominal pain, constipation and diarrhea.  Genitourinary:  Negative for difficulty urinating and dysuria.  Musculoskeletal:  Positive for arthralgias, gait problem and joint swelling. Negative for back pain and myalgias.  Neurological:  Negative for dizziness, tremors, seizures, syncope, facial asymmetry, speech difficulty, weakness, light-headedness, numbness and headaches.  Psychiatric/Behavioral:  Positive for confusion. Negative for agitation and behavioral problems.     Immunization History  Administered Date(s) Administered   Influenza, High Dose Seasonal PF 09/26/2015, 09/03/2017, 08/28/2018, 09/23/2020, 09/07/2022   Influenza,inj,Quad PF,6+ Mos 10/01/2019   Influenza-Unspecified 09/22/2009, 08/27/2011, 10/08/2012, 12/08/2014, 02/10/2018, 09/06/2021   Moderna Covid-19 Fall Seasonal Vaccine 27yrs & older 10/08/2022   Moderna Covid-19 Vaccine Bivalent Booster 64yrs & up 04/19/2022   Moderna Sars-Covid-2 Vaccination 12/14/2019, 01/12/2020, 09/20/2020, 10/13/2020, 10/08/2022   Pneumococcal Conjugate-13 12/31/2013   Pneumococcal  Polysaccharide-23 12/04/1999, 02/02/2016   Td 12/04/2000   Tdap 11/07/2012, 01/18/2023   Zoster Recombinant(Shingrix) 04/01/2022, 06/01/2022   Pertinent  Health Maintenance Due  Topic Date Due   INFLUENZA VACCINE  07/04/2023   DEXA SCAN  Completed      11/20/2022    2:03 PM 12/11/2022   11:10 AM 02/19/2023    2:40 PM 04/11/2023   11:32 AM 08/13/2023   12:36 PM  Fall Risk  Falls in the past year? 0 0 1 1 1   Was there an injury with Fall? 0 0 0 1 1  Fall Risk Category Calculator 0 0 1 2 2   Fall Risk Category (Retired) Low Low     (RETIRED) Patient Fall Risk Level Low fall risk Low fall risk     Patient at Risk for Falls Due to  No Fall Risks History of fall(s);Impaired balance/gait;Impaired mobility History of fall(s) History of fall(s);Impaired balance/gait  Fall risk Follow up Falls evaluation completed Falls evaluation completed Falls evaluation completed;Education provided;Falls prevention discussed Falls evaluation completed Falls evaluation completed;Education provided;Falls  prevention discussed   Functional Status Survey:    Vitals:   10/28/23 1129  BP: (!) 94/53  Pulse: 92  Resp: 16  Temp: (!) 97.2 F (36.2 C)  TempSrc: Temporal  SpO2: 97%  Weight: 131 lb (59.4 kg)  Height: 5\' 5"  (1.651 m)   Body mass index is 21.8 kg/m. Physical Exam Vitals and nursing note reviewed.  Constitutional:      Appearance: Normal appearance.  Musculoskeletal:        General: Swelling (left olecranon area. Has mild warmth and erythema. no wounds) and tenderness (left olecranon/elbow area.) present.     Right lower leg: No edema.     Left lower leg: No edema.  Neurological:     Mental Status: She is alert.     Labs reviewed: No results for input(s): "NA", "K", "CL", "CO2", "GLUCOSE", "BUN", "CREATININE", "CALCIUM", "MG", "PHOS" in the last 8760 hours. No results for input(s): "AST", "ALT", "ALKPHOS", "BILITOT", "PROT", "ALBUMIN" in the last 8760 hours. No results for input(s):  "WBC", "NEUTROABS", "HGB", "HCT", "MCV", "PLT" in the last 8760 hours. Lab Results  Component Value Date   TSH 1.91 11/21/2021   No results found for: "HGBA1C" Lab Results  Component Value Date   CHOL 241 (A) 09/10/2019   HDL 99 (A) 09/10/2019   LDLCALC 128 09/10/2019   TRIG 72 09/10/2019    Significant Diagnostic Results in last 30 days:  No results found.  Assessment/Plan  1. Left elbow pain  Inflammatory arthritis most likely   - predniSONE (DELTASONE) 20 MG tablet; Take 1 tablet (20 mg total) by mouth daily with breakfast.  Family/ staff Communication: nurse and resident   Labs/tests ordered:  due for labs will check uric acid, CBC CMP

## 2023-10-29 ENCOUNTER — Encounter: Payer: Self-pay | Admitting: Adult Health

## 2023-10-29 DIAGNOSIS — M064 Inflammatory polyarthropathy: Secondary | ICD-10-CM | POA: Diagnosis not present

## 2023-10-29 DIAGNOSIS — I10 Essential (primary) hypertension: Secondary | ICD-10-CM | POA: Diagnosis not present

## 2023-10-29 LAB — BASIC METABOLIC PANEL
BUN: 23 — AB (ref 4–21)
CO2: 24 — AB (ref 13–22)
Chloride: 107 (ref 99–108)
Creatinine: 0.9 (ref 0.5–1.1)
Glucose: 86
Potassium: 4 meq/L (ref 3.5–5.1)
Sodium: 142 (ref 137–147)

## 2023-10-29 LAB — HEPATIC FUNCTION PANEL
ALT: 8 U/L (ref 7–35)
AST: 17 (ref 13–35)
Alkaline Phosphatase: 81 (ref 25–125)
Bilirubin, Total: 0.2

## 2023-10-29 LAB — COMPREHENSIVE METABOLIC PANEL
Albumin: 3.3 — AB (ref 3.5–5.0)
Calcium: 8.2 — AB (ref 8.7–10.7)

## 2023-10-29 MED ORDER — PREDNISONE 20 MG PO TABS
20.0000 mg | ORAL_TABLET | Freq: Every day | ORAL | Status: DC
Start: 2023-10-29 — End: 2023-12-05

## 2023-11-05 ENCOUNTER — Encounter: Payer: Self-pay | Admitting: Orthopedic Surgery

## 2023-11-05 ENCOUNTER — Non-Acute Institutional Stay (SKILLED_NURSING_FACILITY): Payer: Medicare Other | Admitting: Orthopedic Surgery

## 2023-11-05 DIAGNOSIS — N1831 Chronic kidney disease, stage 3a: Secondary | ICD-10-CM

## 2023-11-05 DIAGNOSIS — H401132 Primary open-angle glaucoma, bilateral, moderate stage: Secondary | ICD-10-CM | POA: Diagnosis not present

## 2023-11-05 DIAGNOSIS — K5901 Slow transit constipation: Secondary | ICD-10-CM | POA: Diagnosis not present

## 2023-11-05 DIAGNOSIS — G309 Alzheimer's disease, unspecified: Secondary | ICD-10-CM | POA: Diagnosis not present

## 2023-11-05 DIAGNOSIS — I10 Essential (primary) hypertension: Secondary | ICD-10-CM | POA: Diagnosis not present

## 2023-11-05 DIAGNOSIS — M810 Age-related osteoporosis without current pathological fracture: Secondary | ICD-10-CM

## 2023-11-05 DIAGNOSIS — R569 Unspecified convulsions: Secondary | ICD-10-CM | POA: Diagnosis not present

## 2023-11-05 DIAGNOSIS — M25522 Pain in left elbow: Secondary | ICD-10-CM

## 2023-11-05 DIAGNOSIS — E042 Nontoxic multinodular goiter: Secondary | ICD-10-CM

## 2023-11-05 DIAGNOSIS — F0280 Dementia in other diseases classified elsewhere without behavioral disturbance: Secondary | ICD-10-CM

## 2023-11-05 DIAGNOSIS — F0150 Vascular dementia without behavioral disturbance: Secondary | ICD-10-CM

## 2023-11-05 NOTE — Progress Notes (Unsigned)
Location:  [facility] Room Number: 315-A Place of Service:  SNF [phone]) Provider:  Khari Lett Roland Earl, MD  Patient Care Team: Mahlon Gammon, MD as PCP - General (Internal Medicine) York Spaniel, MD (Inactive) as Consulting Physician (Neurology)  Extended Emergency Contact Information Primary Emergency Contact: [facility] Home Phone: [phone] Mobile Phone: [phone] Relation: Relative Secondary Emergency Contact: wright,[place] Mobile Phone: [phone] Relation: Daughter  Code Status:  DNR Goals of care: Advanced Directive information    11/05/2023    4:53 PM  Advanced Directives  Does Patient Have a Medical Advance Directive? Yes  Type of Estate agent of [place];Out of facility DNR (pink MOST or yellow form)  Does patient want to make changes to medical advance directive? No - Patient declined  Copy of Healthcare Power of Attorney in Chart? Yes - validated most recent copy scanned in chart (See row information)  Pre-existing out of facility DNR order (yellow form or pink MOST form) Pink MOST form placed in chart (order not valid for inpatient use)     Chief Complaint  Patient presents with   Routine    HPI:  Pt is a 89 y.o. female seen today for medical management of chronic diseases.    She currently resides on the memory care unit at [facility] due to AD. PMH: HTN, COPD, epilepsy, neuropathy, pseudogout,OA, osteoporosis, pelvic fracture 10/2022, open angle glaucoma.    Left elbow pain- h/o pseudogout, uric acid 4.20, prescribed prednisone, she has denied pain for past few days per nursing  Seizure- no recent episodes, remains on Lamictal  Alzheimer's dementia- no behaviors, MMSE 18/30 05/2023, dependent with ADLs except feeding, ambulates with wheelchair, remains on Aricept and Namenda HTN- BUN/creat 23.3/0.91 11/04/2023, not on medication, see trends below CKD- see above, GFR 60  (12/02)> was 42 (05/12)> was 61 (2023) Glaucoma- followed by ophthalmology, remains on brimonidine/bimatoprost/dorzolamide-timolol/pilocarpine  Thyroid nodules- 12/14/2022 thyroid ultrasound with nodule 3.5 cm x 1.9 cm x 1.4 cm (right lobe)>fine needle aspiration recommended> HPOA does not want aggressive treatment, TSH 1.91/ T4 5.8 11/2021  Osteoporosis- DEXA 2021, t score -5.2, remains on Fosamax Constipation- remains on Miralax and colace  Recent blood pressures:  11/28- 148/78, 136/80  11/27- 122/68, 138/74  11/26- 112/74  Recent weights:  12/01- 131 lbs  11/01- 131 lbs   10/01- 132.2 lbs  Past Medical History:  Diagnosis Date   Arthritis    Per [facility] new patient packet    Epilepsy (HCC)    Per [facility] new patient packet    Glaucoma    Hearing deficit    Hearing aids   High blood pressure    Per [facility] new patient packet    History of abnormal weight loss    Low back pain    Memory difficulty 05/09/2016   MGUS (monoclonal gammopathy of unknown significance)    Neuropathy associated with MGUS (HCC) 11/09/2015   Radiculopathy of sacral and sacrococcygeal region    S1   Seizures (HCC)    Past Surgical History:  Procedure Laterality Date   APPENDECTOMY     BACK SURGERY     CATARACT EXTRACTION Left    OVARY SURGERY     PARATHYROIDECTOMY  9/   TRABECULECTOMY Left     Allergies  Allergen Reactions   Hctz [Hydrochlorothiazide]    Other Other (See Comments)    Caused sodium to drop    Outpatient Encounter Medications as of 11/05/2023  Medication Sig   acetaminophen (  TYLENOL) 325 MG tablet Take 650 mg by mouth every 8 (eight) hours.   alendronate (FOSAMAX) 70 MG tablet Take 70 mg by mouth once a week. Take with a full glass of water on an empty stomach. Friday   bimatoprost (LUMIGAN) 0.01 % SOLN Place 1 drop into both eyes at bedtime.   brimonidine (ALPHAGAN) 0.2 % ophthalmic solution Place 1 drop into the right eye 3 (three) times daily. Wait 10 minutes between drops. (Keep  eyes closed for 2 minutes after each drop)   chlorhexidine (PERIDEX) 0.12 % solution Use as directed 5 mLs in the mouth or throat at bedtime. 1 tsp   Cholecalciferol (VITAMIN D3) 50 MCG (2000 UT) TABS Take by mouth.   cyanocobalamin 1000 MCG tablet Take 1,000 mcg by mouth in the morning.   docusate sodium (COLACE) 100 MG capsule Take 100 mg by mouth daily.   donepezil (ARICEPT) 10 MG tablet Take 10 mg by mouth at bedtime.   dorzolamide-timolol (COSOPT) 22.3-6.8 MG/ML ophthalmic solution Place 1 drop into the right eye 2 (two) times daily. Wait 10 minutes between drops. (Keep eyes closed for 2 minutes after each drop)   lamoTRIgine (LAMICTAL) 100 MG tablet Take 1 tablet in morning, 1 and 1/2 tablets at night   memantine (NAMENDA) 5 MG tablet Take 10 mg by mouth 2 (two) times daily.   Multiple Vitamin (MULTIVITAMIN) tablet Take 1 tablet by mouth daily.   pilocarpine (PILOCAR) 4 % ophthalmic solution Place 1 drop into the right eye 4 (four) times daily. Wait 10 minutes between drops. (Keep eyes closed for 2 minutes after each drop)   polyethylene glycol (MIRALAX) 17 g packet Take 17 g by mouth every other day.   RHOPRESSA 0.02 % SOLN Apply 1 drop to eye every evening.   diclofenac Sodium (VOLTAREN) 1 % GEL Apply 4 g topically as needed (Apply to left hip prn for pain or legft thumb pain). (Patient not taking: Reported on 10/28/2023)   predniSONE (DELTASONE) 20 MG tablet Take 1 tablet (20 mg total) by mouth daily with breakfast. (Patient not taking: Reported on 11/05/2023)   No facility-administered encounter medications on file as of 11/05/2023.    Review of Systems  Unable to perform ROS: Dementia    Immunization History  Administered Date(s) Administered   Influenza, High Dose Seasonal PF 09/26/2015, 09/03/2017, 08/28/2018, 09/23/2020, 09/07/2022   Influenza,inj,Quad PF,6+ Mos 10/01/2019   Influenza-Unspecified 09/22/2009, 08/27/2011, 10/08/2012, 12/08/2014, 02/10/2018, 09/06/2021   Moderna  Covid-19 Fall Seasonal Vaccine 32yrs & older 10/08/2022   Moderna Covid-19 Vaccine Bivalent Booster 58yrs & up 04/19/2022   Moderna Sars-Covid-2 Vaccination 12/14/2019, 01/12/2020, 09/20/2020, 10/13/2020, 10/08/2022   Pneumococcal Conjugate-13 12/31/2013   Pneumococcal Polysaccharide-23 12/04/1999, 02/02/2016   Td 12/04/2000   Tdap 11/07/2012, 01/18/2023   Zoster Recombinant(Shingrix) 04/01/2022, 06/01/2022   Pertinent  Health Maintenance Due  Topic Date Due   INFLUENZA VACCINE  07/04/2023   DEXA SCAN  Completed      11/20/2022    2:03 PM 12/11/2022   11:10 AM 02/19/2023    2:40 PM 04/11/2023   11:32 AM 08/13/2023   12:36 PM  Fall Risk  Falls in the past year? 0 0 1 1 1   Was there an injury with Fall? 0 0 0 1 1  Fall Risk Category Calculator 0 0 1 2 2   Fall Risk Category (Retired) Low Low     (RETIRED) Patient Fall Risk Level Low fall risk Low fall risk     Patient at Risk for  Falls Due to  No Fall Risks History of fall(s);Impaired balance/gait;Impaired mobility History of fall(s) History of fall(s);Impaired balance/gait  Fall risk Follow up Falls evaluation completed Falls evaluation completed Falls evaluation completed;Education provided;Falls prevention discussed Falls evaluation completed Falls evaluation completed;Education provided;Falls prevention discussed   Functional Status Survey:    Vitals:   11/05/23 1648  BP: (!) 148/78  Pulse: 91  Resp: 16  Temp: 97.9 F (36.6 C)  SpO2: 96%  Weight: 131 lb (59.4 kg)  Height: 5\' 5"  (1.651 m)   Body mass index is 21.8 kg/m. Physical Exam Vitals reviewed.  Constitutional:      General: She is not in acute distress. HENT:     Head: Normocephalic.     Right Ear: There is no impacted cerumen.     Left Ear: There is no impacted cerumen.     Nose: Nose normal.     Mouth/Throat:     Mouth: Mucous membranes are moist.  Eyes:     General:        Right eye: No discharge.        Left eye: No discharge.  Cardiovascular:      Rate and Rhythm: Normal rate and regular rhythm.     Pulses: Normal pulses.     Heart sounds: Normal heart sounds.  Pulmonary:     Effort: Pulmonary effort is normal.     Breath sounds: Normal breath sounds.  Abdominal:     General: Bowel sounds are normal. There is no distension.     Palpations: Abdomen is soft.     Tenderness: There is no abdominal tenderness.  Musculoskeletal:     Left elbow: No swelling or deformity. Normal range of motion. No tenderness.     Cervical back: Neck supple.     Right lower leg: No edema.     Left lower leg: No edema.  Skin:    General: Skin is warm.     Capillary Refill: Capillary refill takes less than 2 seconds.  Neurological:     General: No focal deficit present.     Mental Status: She is alert. Mental status is at baseline.     Motor: Weakness present.     Gait: Gait abnormal.  Psychiatric:        Mood and Affect: Mood normal.     Comments: Very pleasant, follows commands, alert to self/familiar face     Labs reviewed: No results for input(s): "NA", "K", "CL", "CO2", "GLUCOSE", "BUN", "CREATININE", "CALCIUM", "MG", "PHOS" in the last 8760 hours. No results for input(s): "AST", "ALT", "ALKPHOS", "BILITOT", "PROT", "ALBUMIN" in the last 8760 hours. No results for input(s): "WBC", "NEUTROABS", "HGB", "HCT", "MCV", "PLT" in the last 8760 hours. Lab Results  Component Value Date   TSH 1.91 11/21/2021   No results found for: "HGBA1C" Lab Results  Component Value Date   CHOL 241 (A) 09/10/2019   HDL 99 (A) 09/10/2019   LDLCALC 128 09/10/2019   TRIG 72 09/10/2019    Significant Diagnostic Results in last 30 days:  No results found.  Assessment/Plan 1. Left elbow pain - h/o pseudogout - uric acid 4.20 11/04/2023 - resolved with prednisone  2. Seizure (HCC) - no recent episodes - cont Lamictal  3. Mixed Alzheimer's and vascular dementia (HCC) - no behaviors - MMSE 18/30 05/2023 - dependent with ADLs except feeding - weights  stable - cont Aricept and Namenda  4. Essential hypertension - controlled without medication  5. Stage 3a chronic kidney disease (HCC) -  GFR 60 (12/02)> was 42 (05/12)> was 61 (2023) - encourage hydration with water - avoid NSAIDS  6. Primary open angle glaucoma (POAG) of both eyes, moderate stage - stable with brimonidine/bimatoprost/dorzolamide-timolol/pilocarpine   7. Multiple thyroid nodules - no further workup per goals of care - TSH 1.91 11/2021  8. Senile osteoporosis - cont Fosamax  9. Slow transit constipation - cont Miralax and colace   Family/ staff Communication: plan discussed with patient and nursing  Labs/tests ordered:  none

## 2023-12-05 ENCOUNTER — Non-Acute Institutional Stay (SKILLED_NURSING_FACILITY): Payer: Medicare Other | Admitting: Adult Health

## 2023-12-05 ENCOUNTER — Encounter: Payer: Self-pay | Admitting: Adult Health

## 2023-12-05 DIAGNOSIS — N1832 Chronic kidney disease, stage 3b: Secondary | ICD-10-CM

## 2023-12-05 DIAGNOSIS — M1120 Other chondrocalcinosis, unspecified site: Secondary | ICD-10-CM | POA: Diagnosis not present

## 2023-12-05 DIAGNOSIS — G309 Alzheimer's disease, unspecified: Secondary | ICD-10-CM | POA: Diagnosis not present

## 2023-12-05 DIAGNOSIS — E538 Deficiency of other specified B group vitamins: Secondary | ICD-10-CM | POA: Diagnosis not present

## 2023-12-05 DIAGNOSIS — M810 Age-related osteoporosis without current pathological fracture: Secondary | ICD-10-CM | POA: Diagnosis not present

## 2023-12-05 DIAGNOSIS — H401132 Primary open-angle glaucoma, bilateral, moderate stage: Secondary | ICD-10-CM | POA: Diagnosis not present

## 2023-12-05 DIAGNOSIS — G40309 Generalized idiopathic epilepsy and epileptic syndromes, not intractable, without status epilepticus: Secondary | ICD-10-CM

## 2023-12-05 DIAGNOSIS — F0150 Vascular dementia without behavioral disturbance: Secondary | ICD-10-CM

## 2023-12-05 DIAGNOSIS — Z66 Do not resuscitate: Secondary | ICD-10-CM

## 2023-12-05 DIAGNOSIS — F0280 Dementia in other diseases classified elsewhere without behavioral disturbance: Secondary | ICD-10-CM

## 2023-12-05 NOTE — Progress Notes (Signed)
 Location:  [facility] Room Number: 315-A Place of Service:  SNF [phone]) Provider:  Tawni America, NP   Patient Care Team: Charlanne Fredia CROME, MD as PCP - General (Internal Medicine) Jenel Carlin POUR, MD (Inactive) as Consulting Physician (Neurology)  Extended Emergency Contact Information Primary Emergency Contact: [facility] Home Phone: [phone] Mobile Phone: [phone] Relation: Relative Secondary Emergency Contact: wright,[place] Mobile Phone: [phone] Relation: Daughter  Code Status:  DNR Goals of care: Advanced Directive information    12/05/2023    9:04 AM  Advanced Directives  Does Patient Have a Medical Advance Directive? Yes  Type of Estate Agent of [place];Out of facility DNR (pink MOST or yellow form)  Does patient want to make changes to medical advance directive? No - Patient declined  Copy of Healthcare Power of Attorney in Chart? Yes - validated most recent copy scanned in chart (See row information)  Pre-existing out of facility DNR order (yellow form or pink MOST form) Pink MOST form placed in chart (order not valid for inpatient use)     Chief Complaint  Patient presents with   Medical Management of Chronic Issues    Routine visit. Discuss need for additional covid boosters     HPI:  Pt is a 89 y.o. female seen today for medical management of chronic diseases.    PMH significant for glaucoma, seizures, arthritis, HTN, low back pain, dementia, MGUS, h/x of parathyroidectomy, osteoporosis, and peripheral neuropathy.   Resides in memory care due to progressive dementia. No acute behavioral concerns. Spends most of the day in her room.  Remains ambulatory. MMSE 18/30 05/2023  Had an episode of pseudogout to the left elbow in Nov which resoled with prednisone  No joint pain reported.  Followed by ophthalmology for glaucoma, last ween Nov 2024 Hx of seizures, no new events.  Did have  syncope which also resolved.  On fosamax for OP since 04/2021, T score -5.2 prior hx of wrist and pelvic fractures.  Prior hx of thyroid  nodules not worked up due to age/goals of care.   Past Medical History:  Diagnosis Date   Arthritis    Per [facility] new patient packet    Epilepsy [facility])    Per [facility] new patient packet    Glaucoma    Hearing deficit    Hearing aids   High blood pressure    Per [facility] new patient packet    History of abnormal weight loss    Low back pain    Memory difficulty 05/09/2016   MGUS (monoclonal gammopathy of unknown significance)    Neuropathy associated with MGUS (HCC) 11/09/2015   Radiculopathy of sacral and sacrococcygeal region    S1   Seizures (HCC)    Past Surgical History:  Procedure Laterality Date   APPENDECTOMY     BACK SURGERY     CATARACT EXTRACTION Left    OVARY SURGERY     PARATHYROIDECTOMY  9/   TRABECULECTOMY Left     Allergies  Allergen Reactions   Hctz [Hydrochlorothiazide]    Other Other (See Comments)    Caused sodium to drop    Outpatient Encounter Medications as of 12/05/2023  Medication Sig   acetaminophen  (TYLENOL ) 325 MG tablet Take 650 mg by mouth every 8 (eight) hours as needed.   alendronate (FOSAMAX) 70 MG tablet Take 70 mg by mouth once a week. Take with a full glass of water on an empty stomach. Friday   bimatoprost (LUMIGAN) 0.01 % SOLN Place 1  drop into both eyes at bedtime.   brimonidine (ALPHAGAN) 0.2 % ophthalmic solution Place 1 drop into the right eye 3 (three) times daily. Wait 10 minutes between drops. (Keep eyes closed for 2 minutes after each drop)   chlorhexidine (PERIDEX) 0.12 % solution Use as directed 5 mLs in the mouth or throat at bedtime. 1 tsp   Cholecalciferol (VITAMIN D3) 50 MCG (2000 UT) TABS Take 1 tablet by mouth daily.   cyanocobalamin  1000 MCG tablet Take 1,000 mcg by mouth in the morning.   docusate sodium (COLACE) 100 MG capsule Take 100 mg by mouth daily.   donepezil (ARICEPT) 10 MG tablet Take  10 mg by mouth at bedtime.   dorzolamide-timolol (COSOPT) 22.3-6.8 MG/ML ophthalmic solution Place 1 drop into the right eye 2 (two) times daily. Wait 10 minutes between drops. (Keep eyes closed for 2 minutes after each drop)   lamoTRIgine  (LAMICTAL ) 100 MG tablet Take 1 tablet in morning, 1 and 1/2 tablets at night   memantine (NAMENDA) 5 MG tablet Take 10 mg by mouth 2 (two) times daily.   Multiple Vitamin (MULTIVITAMIN) tablet Take 1 tablet by mouth daily.   pilocarpine (PILOCAR) 4 % ophthalmic solution Place 1 drop into the right eye 4 (four) times daily. Wait 10 minutes between drops. (Keep eyes closed for 2 minutes after each drop)   polyethylene glycol (MIRALAX ) 17 g packet Take 17 g by mouth every other day.   RHOPRESSA 0.02 % SOLN Apply 1 drop to eye every evening.   diclofenac Sodium (VOLTAREN) 1 % GEL Apply 4 g topically as needed (Apply to left hip prn for pain or legft thumb pain). (Patient not taking: Reported on 12/05/2023)   predniSONE  (DELTASONE ) 20 MG tablet Take 1 tablet (20 mg total) by mouth daily with breakfast. (Patient not taking: Reported on 12/05/2023)   No facility-administered encounter medications on file as of 12/05/2023.    Review of Systems  Constitutional:  Negative for activity change, appetite change, chills, diaphoresis, fatigue, fever and unexpected weight change.  HENT:  Negative for congestion.   Respiratory:  Negative for cough, shortness of breath and wheezing.   Cardiovascular:  Negative for chest pain, palpitations and leg swelling.  Gastrointestinal:  Negative for abdominal distention, abdominal pain, constipation and diarrhea.  Genitourinary:  Negative for difficulty urinating and dysuria.  Musculoskeletal:  Positive for gait problem (uses walker). Negative for arthralgias, back pain, joint swelling and myalgias.  Neurological:  Negative for dizziness, tremors, seizures, syncope, facial asymmetry, speech difficulty, weakness, light-headedness, numbness and  headaches.  Psychiatric/Behavioral:  Negative for agitation, behavioral problems and confusion.     Immunization History  Administered Date(s) Administered   Fluad Quad(high Dose 65+) 09/24/2023   Influenza, High Dose Seasonal PF 09/26/2015, 09/03/2017, 08/28/2018, 09/23/2020, 09/07/2022   Influenza,inj,Quad PF,6+ Mos 10/01/2019   Influenza-Unspecified 09/22/2009, 08/27/2011, 10/08/2012, 12/08/2014, 02/10/2018, 09/06/2021   Moderna Covid-19 Fall Seasonal Vaccine 90yrs & older 10/08/2022   Moderna Covid-19 Vaccine Bivalent Booster 15yrs & up 04/19/2022, 09/24/2023   Moderna Sars-Covid-2 Vaccination 12/14/2019, 01/12/2020, 09/20/2020, 10/13/2020, 10/08/2022   Pneumococcal Conjugate-13 12/31/2013   Pneumococcal Polysaccharide-23 12/04/1999, 02/02/2016   Td 12/04/2000   Tdap 11/07/2012, 01/18/2023   Zoster Recombinant(Shingrix) 04/01/2022, 06/01/2022   Pertinent  Health Maintenance Due  Topic Date Due   INFLUENZA VACCINE  Completed   DEXA SCAN  Completed      11/20/2022    2:03 PM 12/11/2022   11:10 AM 02/19/2023    2:40 PM 04/11/2023   11:32 AM  08/13/2023   12:36 PM  Fall Risk  Falls in the past year? 0 0 1 1 1   Was there an injury with Fall? 0 0 0 1 1  Fall Risk Category Calculator 0 0 1 2 2   Fall Risk Category (Retired) Low Low     (RETIRED) Patient Fall Risk Level Low fall risk Low fall risk     Patient at Risk for Falls Due to  No Fall Risks History of fall(s);Impaired balance/gait;Impaired mobility History of fall(s) History of fall(s);Impaired balance/gait  Fall risk Follow up Falls evaluation completed Falls evaluation completed Falls evaluation completed;Education provided;Falls prevention discussed Falls evaluation completed Falls evaluation completed;Education provided;Falls prevention discussed   Functional Status Survey:    Vitals:   12/05/23 0859 12/05/23 0901  BP: (!) 148/83 135/76  Pulse: 66   Resp: 18   Temp: 98.1 F (36.7 C)   TempSrc: Temporal   SpO2: 97%    Weight: 131 lb 6.4 oz (59.6 kg)   Height: 5' 5 (1.651 m)    Body mass index is 21.87 kg/m. Physical Exam Vitals and nursing note reviewed.  Constitutional:      General: She is not in acute distress.    Appearance: She is not diaphoretic.  HENT:     Head: Normocephalic and atraumatic.     Nose: Nose normal.  Eyes:     Conjunctiva/sclera: Conjunctivae normal.     Pupils: Pupils are equal, round, and reactive to light.     Comments: Erythema to right sclera. No drainage or swelling.   Neck:     Vascular: No JVD.  Cardiovascular:     Rate and Rhythm: Normal rate and regular rhythm.     Heart sounds: No murmur heard. Pulmonary:     Effort: Pulmonary effort is normal. No respiratory distress.     Breath sounds: Normal breath sounds. No wheezing.  Abdominal:     General: Bowel sounds are normal. There is no distension.     Palpations: Abdomen is soft.     Tenderness: There is no abdominal tenderness.  Musculoskeletal:        General: No swelling, tenderness, deformity or signs of injury.     Right lower leg: No edema.     Left lower leg: No edema.  Skin:    General: Skin is warm and dry.  Neurological:     General: No focal deficit present.     Mental Status: She is alert. Mental status is at baseline.  Psychiatric:        Mood and Affect: Mood normal.     Labs reviewed: Recent Labs    10/29/23 0000  NA 142  K 4.0  CL 107  CO2 24*  BUN 23*  CREATININE 0.9  CALCIUM 8.2*   Recent Labs    10/29/23 0000  AST 17  ALT 8  ALKPHOS 81  ALBUMIN 3.3*   No results for input(s): WBC, NEUTROABS, HGB, HCT, MCV, PLT in the last 8760 hours. Lab Results  Component Value Date   TSH 1.91 11/21/2021   No results found for: HGBA1C Lab Results  Component Value Date   CHOL 241 (A) 09/10/2019   HDL 99 (A) 09/10/2019   LDLCALC 128 09/10/2019   TRIG 72 09/10/2019    Significant Diagnostic Results in last 30 days:  No results  found.  Assessment/Plan  1. DNR (do not resuscitate) (Primary) Order renewed.  - Do not attempt resuscitation (DNR)  2. Mixed Alzheimer's and vascular dementia (HCC)  Progressive decline in cognition and physical function c/w the disease. Continue supportive care in the skilled environment.  3. Primary open angle glaucoma (POAG) of both eyes, moderate stage Followed by Ophthalmology   4. Senile osteoporosis Continue fosamax No further RX at this time due to cost, dementia, goals of care.   5. Generalized convulsive epilepsy without intractable epilepsy (HCC) No new Continue lamictal    6. Pseudogout Resolved  7. Stage 3b chronic kidney disease (HCC) Continue to periodically monitor BMP and avoid nephrotoxic agents   8. Vitamin B 12 deficiency Continue B12 1000 mcg daily  Lab Results  Component Value Date   HGB 12.0 10/12/2022     Family/ staff Communication: nurse  Labs/tests ordered:  NA

## 2023-12-31 ENCOUNTER — Non-Acute Institutional Stay (SKILLED_NURSING_FACILITY): Payer: Medicare Other | Admitting: Orthopedic Surgery

## 2023-12-31 ENCOUNTER — Encounter: Payer: Self-pay | Admitting: Orthopedic Surgery

## 2023-12-31 DIAGNOSIS — R0989 Other specified symptoms and signs involving the circulatory and respiratory systems: Secondary | ICD-10-CM | POA: Diagnosis not present

## 2023-12-31 DIAGNOSIS — J9809 Other diseases of bronchus, not elsewhere classified: Secondary | ICD-10-CM | POA: Diagnosis not present

## 2023-12-31 DIAGNOSIS — F0150 Vascular dementia without behavioral disturbance: Secondary | ICD-10-CM

## 2023-12-31 DIAGNOSIS — R296 Repeated falls: Secondary | ICD-10-CM | POA: Diagnosis not present

## 2023-12-31 DIAGNOSIS — F0280 Dementia in other diseases classified elsewhere without behavioral disturbance: Secondary | ICD-10-CM

## 2023-12-31 DIAGNOSIS — R410 Disorientation, unspecified: Secondary | ICD-10-CM | POA: Diagnosis not present

## 2023-12-31 DIAGNOSIS — J439 Emphysema, unspecified: Secondary | ICD-10-CM | POA: Diagnosis not present

## 2023-12-31 DIAGNOSIS — G309 Alzheimer's disease, unspecified: Secondary | ICD-10-CM | POA: Diagnosis not present

## 2023-12-31 NOTE — Progress Notes (Signed)
Location:  [facility] Room Number: 315/A Place of Service:  SNF [phone]) Provider:  Octavia Heir, NP   Mahlon Gammon, MD  Patient Care Team: Mahlon Gammon, MD as PCP - General (Internal Medicine) York Spaniel, MD (Inactive) as Consulting Physician (Neurology)  Extended Emergency Contact Information Primary Emergency Contact: [facility] Home Phone: [phone] Mobile Phone: [phone] Relation: Relative Secondary Emergency Contact: wright,Aidan Mobile Phone: [phone] Relation: Daughter  Code Status:   Goals of care: Advanced Directive information    12/05/2023    9:04 AM  Advanced Directives  Does Patient Have a Medical Advance Directive? Yes  Type of Estate agent of [place];Out of facility DNR (pink MOST or yellow form)  Does patient want to make changes to medical advance directive? No - Patient declined  Copy of Healthcare Power of Attorney in Chart? Yes - validated most recent copy scanned in chart (See row information)  Pre-existing out of facility DNR order (yellow form or pink MOST form) Pink MOST form placed in chart (order not valid for inpatient use)     Chief Complaint  Patient presents with   Acute Visit    Frequent falls, increased confusion    HPI:  Pt is a 89 y.o. female seen today for acute visit due to frequent falls and increased confusion.   She currently resides on the memory care unit at [facility] due to AD. PMH: HTN, COPD, epilepsy, neuropathy, pseudogout,OA, osteoporosis, pelvic fracture 10/2022, open angle glaucoma.    Poor historian due to dementia. Reported falls 01/22, 01/23 and 01/27 without injury. She was found on the floor by staff in each incident. No apparent head injury. She denies pain. Able to move extremities without difficulty. Ambulating with wheelchair. Nursing also reports increased confusion x 1 week. She is having trouble following directions at times.  She was appropriate during our encounter and followed all commands during exam. H/o seizures, no recent episodes, remains on Lamictal. Vitals stable. Afebrile.    Past Medical History:  Diagnosis Date   Arthritis    Per [facility] new patient packet    Epilepsy [facility])    Per [facility] new patient packet    Glaucoma    Hearing deficit    Hearing aids   High blood pressure    Per [facility] new patient packet    History of abnormal weight loss    Low back pain    Memory difficulty 05/09/2016   MGUS (monoclonal gammopathy of unknown significance)    Neuropathy associated with MGUS (HCC) 11/09/2015   Radiculopathy of sacral and sacrococcygeal region    S1   Seizures (HCC)    Past Surgical History:  Procedure Laterality Date   APPENDECTOMY     BACK SURGERY     CATARACT EXTRACTION Left    OVARY SURGERY     PARATHYROIDECTOMY  9/   TRABECULECTOMY Left     Allergies  Allergen Reactions   Hctz [Hydrochlorothiazide]    Other Other (See Comments)    Caused sodium to drop    Outpatient Encounter Medications as of 12/31/2023  Medication Sig   acetaminophen (TYLENOL) 325 MG tablet Take 650 mg by mouth every 8 (eight) hours as needed.   alendronate (FOSAMAX) 70 MG tablet Take 70 mg by mouth once a week. Take with a full glass of water on an empty stomach. Friday   bimatoprost (LUMIGAN) 0.01 % SOLN Place 1 drop into both eyes at bedtime.   brimonidine (ALPHAGAN) 0.2 %  ophthalmic solution Place 1 drop into the right eye 3 (three) times daily. Wait 10 minutes between drops. (Keep eyes closed for 2 minutes after each drop)   chlorhexidine (PERIDEX) 0.12 % solution Use as directed 5 mLs in the mouth or throat at bedtime. 1 tsp   Cholecalciferol (VITAMIN D3) 50 MCG (2000 UT) TABS Take 1 tablet by mouth daily.   cyanocobalamin 1000 MCG tablet Take 1,000 mcg by mouth in the morning.   docusate sodium (COLACE) 100 MG capsule Take 100 mg by mouth daily.   donepezil (ARICEPT) 10 MG tablet Take 10 mg by mouth at  bedtime.   dorzolamide-timolol (COSOPT) 22.3-6.8 MG/ML ophthalmic solution Place 1 drop into the right eye 2 (two) times daily. Wait 10 minutes between drops. (Keep eyes closed for 2 minutes after each drop)   lamoTRIgine (LAMICTAL) 100 MG tablet Take 1 tablet in morning, 1 and 1/2 tablets at night   memantine (NAMENDA) 5 MG tablet Take 10 mg by mouth 2 (two) times daily.   Multiple Vitamin (MULTIVITAMIN) tablet Take 1 tablet by mouth daily.   pilocarpine (PILOCAR) 4 % ophthalmic solution Place 1 drop into the right eye 4 (four) times daily. Wait 10 minutes between drops. (Keep eyes closed for 2 minutes after each drop)   polyethylene glycol (MIRALAX) 17 g packet Take 17 g by mouth every other day.   RHOPRESSA 0.02 % SOLN Apply 1 drop to eye every evening.   No facility-administered encounter medications on file as of 12/31/2023.    Review of Systems  Unable to perform ROS: Dementia    Immunization History  Administered Date(s) Administered   Fluad Quad(high Dose 65+) 09/24/2023   Influenza, High Dose Seasonal PF 09/26/2015, 09/03/2017, 08/28/2018, 09/23/2020, 09/07/2022   Influenza,inj,Quad PF,6+ Mos 10/01/2019   Influenza-Unspecified 09/22/2009, 08/27/2011, 10/08/2012, 12/08/2014, 02/10/2018, 09/06/2021   Moderna Covid-19 Fall Seasonal Vaccine 66yrs & older 10/08/2022   Moderna Covid-19 Vaccine Bivalent Booster 81yrs & up 04/19/2022, 09/24/2023   Moderna Sars-Covid-2 Vaccination 12/14/2019, 01/12/2020, 09/20/2020, 10/13/2020, 10/08/2022   Pneumococcal Conjugate-13 12/31/2013   Pneumococcal Polysaccharide-23 12/04/1999, 02/02/2016   Td 12/04/2000   Tdap 11/07/2012, 01/18/2023   Zoster Recombinant(Shingrix) 04/01/2022, 06/01/2022   Pertinent  Health Maintenance Due  Topic Date Due   INFLUENZA VACCINE  Completed   DEXA SCAN  Completed      11/20/2022    2:03 PM 12/11/2022   11:10 AM 02/19/2023    2:40 PM 04/11/2023   11:32 AM 08/13/2023   12:36 PM  Fall Risk  Falls in the past  year? 0 0 1 1 1   Was there an injury with Fall? 0 0 0 1 1  Fall Risk Category Calculator 0 0 1 2 2   Fall Risk Category (Retired) Low Low     (RETIRED) Patient Fall Risk Level Low fall risk Low fall risk     Patient at Risk for Falls Due to  No Fall Risks History of fall(s);Impaired balance/gait;Impaired mobility History of fall(s) History of fall(s);Impaired balance/gait  Fall risk Follow up Falls evaluation completed Falls evaluation completed Falls evaluation completed;Education provided;Falls prevention discussed Falls evaluation completed Falls evaluation completed;Education provided;Falls prevention discussed   Functional Status Survey:    Vitals:   12/31/23 1208  BP: 136/76  Pulse: 82  Resp: 16  Temp: 97.7 F (36.5 C)  SpO2: 96%  Weight: 131 lb 6.4 oz (59.6 kg)  Height: 5\' 5"  (1.651 m)   Body mass index is 21.87 kg/m. Physical Exam Vitals reviewed.  Constitutional:  General: She is not in acute distress. HENT:     Head: Normocephalic and atraumatic.  Eyes:     General:        Right eye: No discharge.        Left eye: No discharge.     Pupils: Pupils are equal, round, and reactive to light.  Cardiovascular:     Rate and Rhythm: Normal rate and regular rhythm.     Pulses: Normal pulses.     Heart sounds: Normal heart sounds.  Pulmonary:     Effort: Pulmonary effort is normal.     Breath sounds: Examination of the right-upper field reveals wheezing and rales. Examination of the left-upper field reveals wheezing. Examination of the right-middle field reveals rales. Wheezing and rales present.  Abdominal:     General: Bowel sounds are normal.     Palpations: Abdomen is soft.  Musculoskeletal:     Cervical back: Neck supple.     Right lower leg: Edema present.     Left lower leg: Edema present.     Comments: Non pitting  Skin:    General: Skin is warm.     Capillary Refill: Capillary refill takes less than 2 seconds.  Neurological:     General: No focal  deficit present.     Mental Status: She is alert. Mental status is at baseline.     Gait: Gait abnormal.     Comments: wheelchair  Psychiatric:        Mood and Affect: Mood normal.     Comments: Alert to self/familiar face, follows commands, able to express needs     Labs reviewed: Recent Labs    10/29/23 0000  NA 142  K 4.0  CL 107  CO2 24*  BUN 23*  CREATININE 0.9  CALCIUM 8.2*   Recent Labs    10/29/23 0000  AST 17  ALT 8  ALKPHOS 81  ALBUMIN 3.3*   No results for input(s): "WBC", "NEUTROABS", "HGB", "HCT", "MCV", "PLT" in the last 8760 hours. Lab Results  Component Value Date   TSH 1.91 11/21/2021   No results found for: "HGBA1C" Lab Results  Component Value Date   CHOL 241 (A) 09/10/2019   HDL 99 (A) 09/10/2019   LDLCALC 128 09/10/2019   TRIG 72 09/10/2019    Significant Diagnostic Results in last 30 days:  No results found.  Assessment/Plan 1. Mixed Alzheimer's and vascular dementia (HCC) (Primary) - increased confusion per nursing x 1 week - baseline today, follows commands - no agitation  - cont Namenda  - stat cbc/diff and bmp  2. Abnormal lung sounds - mild exp wheezing, rales to right middle/upper lobes - increased confusion x 1 week per nursing - CXR  3. Frequent falls - noted 01/22, 01/23, and 01/27 - no apparent injury  - ? Disease progression  - ambulates with w/c  - do not think patient would benefit to PT/OT due to dementia    Family/ staff Communication: plan discussed with patient and nurse  Labs/tests ordered:  stat cbc/diff, bmp, CXR

## 2024-01-02 DIAGNOSIS — Z961 Presence of intraocular lens: Secondary | ICD-10-CM | POA: Diagnosis not present

## 2024-01-07 ENCOUNTER — Encounter: Payer: Self-pay | Admitting: Orthopedic Surgery

## 2024-01-07 ENCOUNTER — Non-Acute Institutional Stay (SKILLED_NURSING_FACILITY): Payer: Medicare Other | Admitting: Orthopedic Surgery

## 2024-01-07 DIAGNOSIS — H401132 Primary open-angle glaucoma, bilateral, moderate stage: Secondary | ICD-10-CM | POA: Diagnosis not present

## 2024-01-07 DIAGNOSIS — E042 Nontoxic multinodular goiter: Secondary | ICD-10-CM | POA: Diagnosis not present

## 2024-01-07 DIAGNOSIS — F0280 Dementia in other diseases classified elsewhere without behavioral disturbance: Secondary | ICD-10-CM

## 2024-01-07 DIAGNOSIS — M810 Age-related osteoporosis without current pathological fracture: Secondary | ICD-10-CM | POA: Diagnosis not present

## 2024-01-07 DIAGNOSIS — G309 Alzheimer's disease, unspecified: Secondary | ICD-10-CM | POA: Diagnosis not present

## 2024-01-07 DIAGNOSIS — R296 Repeated falls: Secondary | ICD-10-CM | POA: Diagnosis not present

## 2024-01-07 DIAGNOSIS — I10 Essential (primary) hypertension: Secondary | ICD-10-CM

## 2024-01-07 DIAGNOSIS — K5901 Slow transit constipation: Secondary | ICD-10-CM

## 2024-01-07 DIAGNOSIS — R569 Unspecified convulsions: Secondary | ICD-10-CM

## 2024-01-07 DIAGNOSIS — N1831 Chronic kidney disease, stage 3a: Secondary | ICD-10-CM

## 2024-01-07 DIAGNOSIS — F0150 Vascular dementia without behavioral disturbance: Secondary | ICD-10-CM

## 2024-01-07 NOTE — Progress Notes (Signed)
 Location:  [facility] Room Number: 315/A Place of Service:  SNF [phone]) Provider:  Greig FORBES Cluster, NP   Charlanne Fredia CROME, MD  Patient Care Team: Charlanne Fredia CROME, MD as PCP - General (Internal Medicine) Jenel Carlin POUR, MD (Inactive) as Consulting Physician (Neurology)  Extended Emergency Contact Information Primary Emergency Contact: [facility] Home Phone: [phone] Mobile Phone: [phone] Relation: Relative Secondary Emergency Contact: wright,Latice Mobile Phone: [phone] Relation: Daughter  Code Status:  DNR Goals of care: Advanced Directive information    12/05/2023    9:04 AM  Advanced Directives  Does Patient Have a Medical Advance Directive? Yes  Type of Estate Agent of [place];Out of facility DNR (pink MOST or yellow form)  Does patient want to make changes to medical advance directive? No - Patient declined  Copy of Healthcare Power of Attorney in Chart? Yes - validated most recent copy scanned in chart (See row information)  Pre-existing out of facility DNR order (yellow form or pink MOST form) Pink MOST form placed in chart (order not valid for inpatient use)     Chief Complaint  Patient presents with   Medical Management of Chronic Issues    HPI:  Pt is a 89 y.o. female seen today for medical management of chronic diseases.    She currently resides on the memory care unit at [facility] due to AD. PMH: HTN, COPD, epilepsy, neuropathy, pseudogout,OA, osteoporosis, pelvic fracture 10/2022, open angle glaucoma.    Frequent falls- noted 01/22, 01/23, 01/27, nursing reported increased confusion>wheezing found on exam, CXR/ cbc/diff/cmp normal, no new falls, continues to ambulate with wheelchair Seizure- no recent episodes, remains on Lamictal   Alzheimer's dementia- no behaviors, MMSE 18/30 05/2023, dependent with ADLs except feeding, ambulates with wheelchair, remains on Aricept and Namenda HTN-  BUN/creat 23.3/1.07 12/31/2023, not on medication, see trends below CKD- see above, GFR 49 (01/28)> was 60 (12/02)> was 42 (05/12)> was 61 (2023) Glaucoma- followed by ophthalmology, remains on brimonidine/bimatoprost/dorzolamide-timolol/pilocarpine  Thyroid  nodules- 12/14/2022 thyroid  ultrasound with nodule 3.5 cm x 1.9 cm x 1.4 cm (right lobe)>fine needle aspiration recommended> HPOA does not want aggressive treatment, TSH 1.91/ T4 5.8 11/2021  Osteoporosis- DEXA 2021, t score -5.2, remains on Fosamax Constipation- remains on Miralax  and colace  Recent blood pressures:  01/29- 122/70  01/28- 146/84, 136/76  01/27- 155/74, 118/74  Recent weights:  02/01- 134.2 lbs  01/01- 131.4 lbs  12/01- 131 lbs   Past Medical History:  Diagnosis Date   Arthritis    Per [facility] new patient [facility]    Epilepsy (HCC)    Per [facility] new patient packet    Glaucoma    Hearing deficit    Hearing aids   High blood pressure    Per [facility] new patient packet    History of abnormal weight loss    Low back pain    Memory difficulty 05/09/2016   MGUS (monoclonal gammopathy of unknown significance)    Neuropathy associated with MGUS (HCC) 11/09/2015   Radiculopathy of sacral and sacrococcygeal region    S1   Seizures (HCC)    Past Surgical History:  Procedure Laterality Date   APPENDECTOMY     BACK SURGERY     CATARACT EXTRACTION Left    OVARY SURGERY     PARATHYROIDECTOMY  9/   TRABECULECTOMY Left     Allergies  Allergen Reactions   Hctz [Hydrochlorothiazide]    Other Other (See Comments)    Caused sodium to drop  Outpatient Encounter Medications as of 01/07/2024  Medication Sig   acetaminophen  (TYLENOL ) 325 MG tablet Take 650 mg by mouth every 8 (eight) hours as needed.   alendronate (FOSAMAX) 70 MG tablet Take 70 mg by mouth once a week. Take with a full glass of water on an empty stomach. Friday   bimatoprost (LUMIGAN) 0.01 % SOLN Place 1 drop into both eyes at bedtime.   brimonidine (ALPHAGAN)  0.2 % ophthalmic solution Place 1 drop into the right eye 3 (three) times daily. Wait 10 minutes between drops. (Keep eyes closed for 2 minutes after each drop)   chlorhexidine (PERIDEX) 0.12 % solution Use as directed 5 mLs in the mouth or throat at bedtime. 1 tsp   Cholecalciferol (VITAMIN D3) 50 MCG (2000 UT) TABS Take 1 tablet by mouth daily.   cyanocobalamin  1000 MCG tablet Take 1,000 mcg by mouth in the morning.   docusate sodium (COLACE) 100 MG capsule Take 100 mg by mouth daily.   donepezil (ARICEPT) 10 MG tablet Take 10 mg by mouth at bedtime.   dorzolamide-timolol (COSOPT) 22.3-6.8 MG/ML ophthalmic solution Place 1 drop into the right eye 2 (two) times daily. Wait 10 minutes between drops. (Keep eyes closed for 2 minutes after each drop)   lamoTRIgine  (LAMICTAL ) 100 MG tablet Take 1 tablet in morning, 1 and 1/2 tablets at night   memantine (NAMENDA) 5 MG tablet Take 10 mg by mouth 2 (two) times daily.   Multiple Vitamin (MULTIVITAMIN) tablet Take 1 tablet by mouth daily.   pilocarpine (PILOCAR) 4 % ophthalmic solution Place 1 drop into the right eye 4 (four) times daily. Wait 10 minutes between drops. (Keep eyes closed for 2 minutes after each drop)   polyethylene glycol (MIRALAX ) 17 g packet Take 17 g by mouth every other day.   RHOPRESSA 0.02 % SOLN Apply 1 drop to eye every evening.   No facility-administered encounter medications on file as of 01/07/2024.    Review of Systems  Unable to perform ROS: Dementia    Immunization History  Administered Date(s) Administered   Fluad Quad(high Dose 65+) 09/24/2023   Influenza, High Dose Seasonal PF 09/26/2015, 09/03/2017, 08/28/2018, 09/23/2020, 09/07/2022   Influenza,inj,Quad PF,6+ Mos 10/01/2019   Influenza-Unspecified 09/22/2009, 08/27/2011, 10/08/2012, 12/08/2014, 02/10/2018, 09/06/2021   Moderna Covid-19 Fall Seasonal Vaccine 36yrs & older 10/08/2022   Moderna Covid-19 Vaccine Bivalent Booster 79yrs & up 04/19/2022, 09/24/2023    Moderna Sars-Covid-2 Vaccination 12/14/2019, 01/12/2020, 09/20/2020, 10/13/2020, 10/08/2022   Pneumococcal Conjugate-13 12/31/2013   Pneumococcal Polysaccharide-23 12/04/1999, 02/02/2016   Td 12/04/2000   Tdap 11/07/2012, 01/18/2023   Zoster Recombinant(Shingrix) 04/01/2022, 06/01/2022   Pertinent  Health Maintenance Due  Topic Date Due   INFLUENZA VACCINE  Completed   DEXA SCAN  Completed      12/11/2022   11:10 AM 02/19/2023    2:40 PM 04/11/2023   11:32 AM 08/13/2023   12:36 PM 12/31/2023    1:36 PM  Fall Risk  Falls in the past year? 0 1 1 1 1   Was there an injury with Fall? 0 0 1 1 0  Fall Risk Category Calculator 0 1 2 2 2   Fall Risk Category (Retired) Low      (RETIRED) Patient Fall Risk Level Low fall risk      Patient at Risk for Falls Due to No Fall Risks History of fall(s);Impaired balance/gait;Impaired mobility History of fall(s) History of fall(s);Impaired balance/gait History of fall(s);Impaired balance/gait  Fall risk Follow up Falls evaluation completed Falls evaluation  completed;Education provided;Falls prevention discussed Falls evaluation completed Falls evaluation completed;Education provided;Falls prevention discussed Falls evaluation completed;Education provided   Functional Status Survey:    Vitals:   01/07/24 1236  BP: 122/70  Pulse: 94  Resp: 18  Temp: 98.2 F (36.8 C)  SpO2: 95%  Weight: 134 lb 3.2 oz (60.9 kg)  Height: 5' 5 (1.651 m)   Body mass index is 22.33 kg/m. Physical Exam Vitals reviewed.  Constitutional:      General: She is not in acute distress. HENT:     Head: Normocephalic.     Right Ear: There is no impacted cerumen.     Left Ear: There is no impacted cerumen.     Nose: Nose normal. No rhinorrhea.     Mouth/Throat:     Mouth: Mucous membranes are moist.     Pharynx: No posterior oropharyngeal erythema.  Eyes:     General:        Right eye: No discharge.        Left eye: No discharge.  Cardiovascular:     Rate and Rhythm:  Normal rate and regular rhythm.     Pulses: Normal pulses.     Heart sounds: Normal heart sounds.  Pulmonary:     Effort: Pulmonary effort is normal. No respiratory distress.     Breath sounds: Normal breath sounds. No wheezing or rales.  Abdominal:     General: Bowel sounds are normal.     Palpations: Abdomen is soft.  Musculoskeletal:     Cervical back: Neck supple.     Right lower leg: No edema.     Left lower leg: No edema.  Skin:    General: Skin is warm.     Capillary Refill: Capillary refill takes less than 2 seconds.  Neurological:     General: No focal deficit present.     Mental Status: She is alert. Mental status is at baseline.     Motor: Weakness present.     Gait: Gait abnormal.     Comments: Wheelchair  Psychiatric:        Mood and Affect: Mood normal.     Comments: Alert to self/familiar face, follows commands, very pleasant     Labs reviewed: Recent Labs    10/29/23 0000  NA 142  K 4.0  CL 107  CO2 24*  BUN 23*  CREATININE 0.9  CALCIUM 8.2*   Recent Labs    10/29/23 0000  AST 17  ALT 8  ALKPHOS 81  ALBUMIN 3.3*   No results for input(s): WBC, NEUTROABS, HGB, HCT, MCV, PLT in the last 8760 hours. Lab Results  Component Value Date   TSH 1.91 11/21/2021   No results found for: HGBA1C Lab Results  Component Value Date   CHOL 241 (A) 09/10/2019   HDL 99 (A) 09/10/2019   LDLCALC 128 09/10/2019   TRIG 72 09/10/2019    Significant Diagnostic Results in last 30 days:  No results found.  Assessment/Plan 1. Frequent falls (Primary) - noted 01/22, 01/23, 01/27 - no apparent injury - confused and some wheezing on exam> CXR/ cbc/diff/cmp unremarkable - no further incidents at this time  2. Seizure (HCC) - no recent episodes - cont Lamictal   3. Mixed Alzheimer's and vascular dementia (HCC) - no behaviors - weight stable - dependent with ADLs except feeding - cont Aricept  4. Essential hypertension - controlled without  medication  5. Stage 3a chronic kidney disease (HCC) -GFR 49 (01/28)> was 60 (12/02)> was 42 (  05/12)> was 61 (2023) - cont hydration with water - avoid NSAIDS  6. Primary open angle glaucoma (POAG) of both eyes, moderate stage - cont brimonidine/bimatoprost/dorzolamide-timolol/pilocarpine  7. Multiple thyroid  nodules - no further workup per goals of care  8. Age-related osteoporosis without current pathological fracture - cont Fosamax  9. Slow transit constipation - abdomen soft - cont colace and miralax    Family/ staff Communication: plan discussed with patient and nurse  Labs/tests ordered:  none

## 2024-02-19 ENCOUNTER — Encounter: Payer: Self-pay | Admitting: Internal Medicine

## 2024-02-19 ENCOUNTER — Non-Acute Institutional Stay (SKILLED_NURSING_FACILITY): Payer: Self-pay | Admitting: Internal Medicine

## 2024-02-19 DIAGNOSIS — R569 Unspecified convulsions: Secondary | ICD-10-CM | POA: Diagnosis not present

## 2024-02-19 DIAGNOSIS — G309 Alzheimer's disease, unspecified: Secondary | ICD-10-CM | POA: Diagnosis not present

## 2024-02-19 DIAGNOSIS — I10 Essential (primary) hypertension: Secondary | ICD-10-CM | POA: Diagnosis not present

## 2024-02-19 DIAGNOSIS — F0150 Vascular dementia without behavioral disturbance: Secondary | ICD-10-CM

## 2024-02-19 DIAGNOSIS — E042 Nontoxic multinodular goiter: Secondary | ICD-10-CM

## 2024-02-19 DIAGNOSIS — N1831 Chronic kidney disease, stage 3a: Secondary | ICD-10-CM | POA: Diagnosis not present

## 2024-02-19 DIAGNOSIS — K5901 Slow transit constipation: Secondary | ICD-10-CM

## 2024-02-19 DIAGNOSIS — F0280 Dementia in other diseases classified elsewhere without behavioral disturbance: Secondary | ICD-10-CM

## 2024-02-19 DIAGNOSIS — M810 Age-related osteoporosis without current pathological fracture: Secondary | ICD-10-CM

## 2024-02-19 NOTE — Progress Notes (Unsigned)
 [facility] Room Number: 315-A Place of Service:  SNF [id number]) Provider:  Merian Capron  PCP: Mahlon Gammon, MD  Patient Care Team: Mahlon Gammon, MD as PCP - General (Internal Medicine) York Spaniel, MD (Inactive) as Consulting Physician (Neurology)  Extended Emergency Contact Information Primary Emergency Contact: [facility] Home Phone: [phone] Mobile Phone: [phone] Relation: Relative Secondary Emergency Contact: wright,Jasenia Mobile Phone: [phone] Relation: Daughter  Code Status:  DNR Goals of care: Advanced Directive information    12/05/2023    9:04 AM  Advanced Directives  Does Patient Have a Medical Advance Directive? Yes  Type of Estate agent of [place];Out of facility DNR (pink MOST or yellow form)  Does patient want to make changes to medical advance directive? No - Patient declined  Copy of Healthcare Power of Attorney in Chart? Yes - validated most recent copy scanned in chart (See row information)  Pre-existing out of facility DNR order (yellow form or pink MOST form) Pink MOST form placed in chart (order not valid for inpatient use)     Chief Complaint  Patient presents with   Medical Management of Chronic Issues    Routine Visit. Discuss the need for Covid Booster, and AWV.    HPI:  Pt is a 89 y.o. female seen today for medical management of chronic diseases.    Lives in Memory unit in [place]   Has h/o Alzheimer Dementia, h/o Seizure disorder and Osteoarthritis and Hypertension H/o Peripheral Neuropathy     She is stable. No new Nursing issues. No Behavior issues Her weight is stable Stays in her Wheelchair most of the time No Falls Wt Readings from Last 3 Encounters:  02/19/24 130 lb 3.2 oz (59.1 kg)  01/07/24 134 lb 3.2 oz (60.9 kg)  12/31/23 131 lb 6.4 oz (59.6 kg)   Past Medical History:  Diagnosis Date   Arthritis    Per [facility] new patient  packet    Epilepsy (HCC)    Per [facility] new patient packet    Glaucoma    Hearing deficit    Hearing aids   High blood pressure    Per [facility] new patient packet    History of abnormal weight loss    Low back pain    Memory difficulty 05/09/2016   MGUS (monoclonal gammopathy of unknown significance)    Neuropathy associated with MGUS (HCC) 11/09/2015   Radiculopathy of sacral and sacrococcygeal region    S1   Seizures (HCC)    Past Surgical History:  Procedure Laterality Date   APPENDECTOMY     BACK SURGERY     CATARACT EXTRACTION Left    OVARY SURGERY     PARATHYROIDECTOMY  9/   TRABECULECTOMY Left     Allergies  Allergen Reactions   Hctz [Hydrochlorothiazide]    Other Other (See Comments)    Caused sodium to drop    Allergies as of 02/19/2024       Reactions   Hctz [hydrochlorothiazide]    Other Other (See Comments)   Caused sodium to drop        Medication List        Accurate as of February 19, 2024  4:12 PM. If you have any questions, ask your nurse or doctor.          acetaminophen 325 MG tablet Commonly known as: TYLENOL Take 650 mg by mouth every 8 (eight) hours as needed.   alendronate 70 MG tablet  Commonly known as: FOSAMAX Take 70 mg by mouth once a week. Take with a full glass of water on an empty stomach. Friday   bimatoprost 0.01 % Soln Commonly known as: LUMIGAN Place 1 drop into both eyes at bedtime.   brimonidine 0.2 % ophthalmic solution Commonly known as: ALPHAGAN Place 1 drop into the right eye 3 (three) times daily. Wait 10 minutes between drops. (Keep eyes closed for 2 minutes after each drop)   chlorhexidine 0.12 % solution Commonly known as: PERIDEX Use as directed 5 mLs in the mouth or throat at bedtime. 1 tsp   cyanocobalamin 1000 MCG tablet Take 1,000 mcg by mouth in the morning.   docusate sodium 100 MG capsule Commonly known as: COLACE Take 100 mg by mouth daily.   donepezil 10 MG tablet Commonly known as: ARICEPT Take  10 mg by mouth at bedtime.   dorzolamide-timolol 2-0.5 % ophthalmic solution Commonly known as: COSOPT Place 1 drop into the right eye 2 (two) times daily. Wait 10 minutes between drops. (Keep eyes closed for 2 minutes after each drop)   lamoTRIgine 100 MG tablet Commonly known as: LAMICTAL Take 1 tablet in morning, 1 and 1/2 tablets at night   memantine 5 MG tablet Commonly known as: NAMENDA Take 10 mg by mouth 2 (two) times daily.   multivitamin tablet Take 1 tablet by mouth daily.   pilocarpine 4 % ophthalmic solution Commonly known as: PILOCAR Place 1 drop into the right eye 4 (four) times daily. Wait 10 minutes between drops. (Keep eyes closed for 2 minutes after each drop)   polyethylene glycol 17 g packet Commonly known as: MiraLax Take 17 g by mouth every other day.   Rhopressa 0.02 % Soln Generic drug: Netarsudil Dimesylate Apply 1 drop to eye every evening.   Vitamin D3 50 MCG (2000 UT) Tabs Take 1 tablet by mouth daily.        Review of Systems  Constitutional:  Negative for activity change and appetite change.  HENT: Negative.    Respiratory:  Negative for cough and shortness of breath.   Cardiovascular:  Negative for leg swelling.  Gastrointestinal:  Negative for constipation.  Genitourinary: Negative.   Musculoskeletal:  Positive for gait problem. Negative for arthralgias and myalgias.  Skin: Negative.   Neurological:  Negative for dizziness and weakness.  Psychiatric/Behavioral:  Positive for confusion. Negative for dysphoric mood and sleep disturbance.     Immunization History  Administered Date(s) Administered   Fluad Quad(high Dose 65+) 09/24/2023   Influenza, High Dose Seasonal PF 09/26/2015, 09/03/2017, 08/28/2018, 09/23/2020, 09/07/2022   Influenza,inj,Quad PF,6+ Mos 10/01/2019   Influenza-Unspecified 09/22/2009, 08/27/2011, 10/08/2012, 12/08/2014, 02/10/2018, 09/06/2021   Moderna Covid-19 Fall Seasonal Vaccine 63yrs & older 10/08/2022    Moderna Covid-19 Vaccine Bivalent Booster 57yrs & up 04/19/2022, 09/24/2023   Moderna Sars-Covid-2 Vaccination 12/14/2019, 01/12/2020, 09/20/2020, 10/13/2020, 10/08/2022   Pneumococcal Conjugate-13 12/31/2013   Pneumococcal Polysaccharide-23 12/04/1999, 02/02/2016   Td 12/04/2000   Tdap 11/07/2012, 01/18/2023   Zoster Recombinant(Shingrix) 04/01/2022, 06/01/2022   Pertinent  Health Maintenance Due  Topic Date Due   INFLUENZA VACCINE  Completed   DEXA SCAN  Completed      12/11/2022   11:10 AM 02/19/2023    2:40 PM 04/11/2023   11:32 AM 08/13/2023   12:36 PM 12/31/2023    1:36 PM  Fall Risk  Falls in the past year? 0 1 1 1 1   Was there an injury with Fall? 0 0 1 1 0  Fall Risk Category Calculator 0 1 2 2 2   Fall Risk Category (Retired) Low      (RETIRED) Patient Fall Risk Level Low fall risk      Patient at Risk for Falls Due to No Fall Risks History of fall(s);Impaired balance/gait;Impaired mobility History of fall(s) History of fall(s);Impaired balance/gait History of fall(s);Impaired balance/gait  Fall risk Follow up Falls evaluation completed Falls evaluation completed;Education provided;Falls prevention discussed Falls evaluation completed Falls evaluation completed;Education provided;Falls prevention discussed Falls evaluation completed;Education provided   Functional Status Survey:    Vitals:   02/19/24 1607  BP: 118/67  Pulse: 89  Resp: 18  Temp: (!) 97.2 F (36.2 C)  SpO2: 96%  Weight: 130 lb 3.2 oz (59.1 kg)  Height: 5\' 5"  (1.651 m)   Body mass index is 21.67 kg/m. Physical Exam Vitals reviewed.  Constitutional:      Appearance: Normal appearance.  HENT:     Head: Normocephalic.     Nose: Nose normal.     Mouth/Throat:     Mouth: Mucous membranes are moist.     Pharynx: Oropharynx is clear.  Eyes:     Pupils: Pupils are equal, round, and reactive to light.  Cardiovascular:     Rate and Rhythm: Normal rate and regular rhythm.     Pulses: Normal pulses.      Heart sounds: Normal heart sounds. No murmur heard. Pulmonary:     Effort: Pulmonary effort is normal.     Breath sounds: Normal breath sounds.  Abdominal:     General: Abdomen is flat. Bowel sounds are normal.     Palpations: Abdomen is soft.  Musculoskeletal:        General: Swelling present.     Cervical back: Neck supple.  Skin:    General: Skin is warm.  Neurological:     General: No focal deficit present.     Mental Status: She is alert.  Psychiatric:        Mood and Affect: Mood normal.        Thought Content: Thought content normal.     Labs reviewed: Recent Labs    10/29/23 0000  NA 142  K 4.0  CL 107  CO2 24*  BUN 23*  CREATININE 0.9  CALCIUM 8.2*   Recent Labs    10/29/23 0000  AST 17  ALT 8  ALKPHOS 81  ALBUMIN 3.3*   No results for input(s): "WBC", "NEUTROABS", "HGB", "HCT", "MCV", "PLT" in the last 8760 hours. Lab Results  Component Value Date   TSH 1.91 11/21/2021   No results found for: "HGBA1C" Lab Results  Component Value Date   CHOL 241 (A) 09/10/2019   HDL 99 (A) 09/10/2019   LDLCALC 128 09/10/2019   TRIG 72 09/10/2019    Significant Diagnostic Results in last 30 days:  No results found.  Assessment/Plan 1. Seizure (HCC) (Primary) Lamictal  2. Mixed Alzheimer's and vascular dementia (HCC) Aricept and Namenda SNF level care for ADLS 3. Stage 3a chronic kidney disease (HCC) Creat stable  4. Essential hypertension No Meds   5. Age-related osteoporosis without current pathological fracture On Fosamax since 05/22 T score -5.2 No Prolia due to cost  6. Slow transit constipation Miralax  7. Multiple thyroid nodules No More Follow up due to her age and Frailty     Family/ staff Communication:   Labs/tests ordered:

## 2024-03-09 ENCOUNTER — Encounter: Payer: Self-pay | Admitting: Adult Health

## 2024-03-09 ENCOUNTER — Non-Acute Institutional Stay (SKILLED_NURSING_FACILITY): Admitting: Adult Health

## 2024-03-09 DIAGNOSIS — K529 Noninfective gastroenteritis and colitis, unspecified: Secondary | ICD-10-CM

## 2024-03-09 DIAGNOSIS — M25551 Pain in right hip: Secondary | ICD-10-CM | POA: Diagnosis not present

## 2024-03-09 DIAGNOSIS — R0781 Pleurodynia: Secondary | ICD-10-CM | POA: Diagnosis not present

## 2024-03-09 DIAGNOSIS — W19XXXA Unspecified fall, initial encounter: Secondary | ICD-10-CM | POA: Diagnosis not present

## 2024-03-09 NOTE — Progress Notes (Signed)
 [facility] Room Number: [phone] [id number] Place of Service:  SNF [phone]) Provider:  Fletcher Anon, NP     Patient Care Team: Mahlon Gammon, MD as PCP - General (Internal Medicine) York Spaniel, MD (Inactive) as Consulting Physician (Neurology)  Extended Emergency Contact Information Primary Emergency Contact: [facility] Home Phone: [phone] Mobile Phone: [phone] Relation: Relative Secondary Emergency Contact: wright,Vallarie Mobile Phone: [phone] Relation: Daughter  Code Status:  DNR Goals of care: Advanced Directive information    02/19/2024    4:13 PM  Advanced Directives  Does Patient Have a Medical Advance Directive? Yes  Type of Estate agent of [place];Out of facility DNR (pink MOST or yellow form)  Does patient want to make changes to medical advance directive? No - Patient declined  Copy of Healthcare Power of Attorney in Chart? Yes - validated most recent copy scanned in chart (See row information)     Chief Complaint  Patient presents with   Acute Visit    fall    HPI:  Pt is a 89 y.o. female seen today for a fall.  The nurse reports she was bathing and had a large loose stool and slipped and fell on the floor. She did not hit her head. After she fell she complained of right hip pain and flank pain. The nurse is requesting an xray  Vitals are ok. Pt has dementia and can't provide a history. Does verbalize right hip pain. Able to bear weight and rotate the RLE.   There a GI illness affecting the memory care unit with symptoms of vomiting and diarrhea. She has had loose stools for two days. No fever. Normal bp. Taking in a bland diet adequately. No vomiting or pain.   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   alendronate (FOSAMAX) 70 MG tablet Take 70 mg by mouth once a week. Take with a full glass of water on an empty stomach. Friday   bimatoprost (LUMIGAN) 0.01 % SOLN Place 1 drop into both eyes at bedtime.   brimonidine (ALPHAGAN) 0.2 % ophthalmic solution Place 1 drop into the right eye 3 (three) times daily. Wait 10 minutes between drops. (Keep eyes closed for 2 minutes after each drop)   chlorhexidine (PERIDEX) 0.12 % solution Use as directed 5 mLs in  the mouth or throat at bedtime. 1 tsp   Cholecalciferol (VITAMIN D3) 50 MCG (2000 UT) TABS Take 1 tablet by mouth daily.   cyanocobalamin 1000 MCG tablet Take 1,000 mcg by mouth in the morning.   docusate sodium (COLACE) 100 MG capsule Take 100 mg by mouth daily.   donepezil (ARICEPT) 10 MG tablet Take 10 mg by mouth at bedtime.   dorzolamide-timolol (COSOPT) 22.3-6.8 MG/ML ophthalmic solution Place 1 drop into the right eye 2 (two) times daily. Wait 10 minutes between drops. (Keep eyes closed for 2 minutes after each drop)   lamoTRIgine (LAMICTAL) 100 MG tablet Take 1 tablet in morning, 1 and 1/2 tablets at night   memantine (NAMENDA) 5 MG tablet Take 10 mg by mouth 2 (two) times daily.    Multiple Vitamin (MULTIVITAMIN) tablet Take 1 tablet by mouth daily.   pilocarpine (PILOCAR) 4 % ophthalmic solution Place 1 drop into the right eye 4 (four) times daily. Wait 10 minutes between drops. (Keep eyes closed for 2 minutes after each drop)   polyethylene glycol (MIRALAX) 17 g packet Take 17 g by mouth every other day.   RHOPRESSA 0.02 % SOLN Apply 1 drop to eye every evening.   No facility-administered encounter medications on file as of 03/09/2024.    Review of Systems  Constitutional:  Negative for activity change, appetite change, chills, diaphoresis, fatigue, fever and unexpected weight change.  HENT:  Negative for congestion.   Respiratory:  Negative for cough, shortness of breath and wheezing.   Cardiovascular:  Negative for chest pain, palpitations and leg swelling.  Gastrointestinal:  Positive for diarrhea. Negative for abdominal distention, abdominal pain, blood in stool, constipation, nausea and vomiting.  Genitourinary:  Negative for difficulty urinating and dysuria.  Musculoskeletal:  Positive for arthralgias (right hip and flank) and gait problem. Negative for back pain, joint swelling and myalgias.  Neurological:  Negative for dizziness, tremors, seizures, syncope, facial asymmetry, speech difficulty, weakness, light-headedness, numbness and headaches.  Psychiatric/Behavioral:  Positive for confusion (chronic). Negative for agitation and behavioral problems.     Immunization History  Administered Date(s) Administered   Fluad Quad(high Dose 65+) 09/24/2023   Influenza, High Dose Seasonal PF 09/26/2015, 09/03/2017, 08/28/2018, 09/23/2020, 09/07/2022   Influenza,inj,Quad PF,6+ Mos 10/01/2019   Influenza-Unspecified 09/22/2009, 08/27/2011, 10/08/2012, 12/08/2014, 02/10/2018, 09/06/2021   Moderna Covid-19 Fall Seasonal Vaccine 55yrs & older 10/08/2022   Moderna Covid-19 Vaccine Bivalent Booster 64yrs & up 04/19/2022, 09/24/2023   Moderna Sars-Covid-2 Vaccination  12/14/2019, 01/12/2020, 09/20/2020, 10/13/2020, 10/08/2022   Pneumococcal Conjugate-13 12/31/2013   Pneumococcal Polysaccharide-23 12/04/1999, 02/02/2016   Td 12/04/2000   Tdap 11/07/2012, 01/18/2023   Zoster Recombinant(Shingrix) 04/01/2022, 06/01/2022   Pertinent  Health Maintenance Due  Topic Date Due   INFLUENZA VACCINE  07/03/2024   DEXA SCAN  Completed      12/11/2022   11:10 AM 02/19/2023    2:40 PM 04/11/2023   11:32 AM 08/13/2023   12:36 PM 12/31/2023    1:36 PM  Fall Risk  Falls in the past year? 0 1 1 1 1   Was there an injury with Fall? 0 0 1 1 0  Fall Risk Category Calculator 0 1 2 2 2   Fall Risk Category (Retired) Low      (RETIRED) Patient Fall Risk Level Low fall risk      Patient at Risk for Falls Due to No Fall Risks History of fall(s);Impaired balance/gait;Impaired mobility History of fall(s) History of fall(s);Impaired balance/gait History of fall(s);Impaired balance/gait  Fall risk  Follow up Falls evaluation completed Falls evaluation completed;Education provided;Falls prevention discussed Falls evaluation completed Falls evaluation completed;Education provided;Falls prevention discussed Falls evaluation completed;Education provided   Functional Status Survey:    Vitals:   03/09/24 0942  BP: 130/69  Pulse: 80  Resp: 18  Temp: (!) 97.2 F (36.2 C)  SpO2: 97%  Weight: 134 lb 6.4 oz (61 kg)  Height: 5\' 5"  (1.651 m)   Body mass index is 22.37 kg/m. Physical Exam Vitals and nursing note reviewed.  Constitutional:      General: She is not in acute distress.    Appearance: Normal appearance. She is not diaphoretic.  HENT:     Head: Normocephalic and atraumatic.     Nose: No congestion.     Mouth/Throat:     Mouth: Mucous membranes are moist.     Pharynx: Oropharynx is clear. No oropharyngeal exudate or posterior oropharyngeal erythema.  Eyes:     Conjunctiva/sclera: Conjunctivae normal.     Pupils: Pupils are equal, round, and reactive to light.  Neck:      Thyroid: No thyromegaly.     Vascular: No carotid bruit or JVD.  Cardiovascular:     Rate and Rhythm: Normal rate and regular rhythm.     Heart sounds: Normal heart sounds. No murmur heard. Pulmonary:     Effort: Pulmonary effort is normal. No respiratory distress.     Breath sounds: Normal breath sounds. No stridor.  Abdominal:     General: Bowel sounds are normal. There is no distension.     Palpations: Abdomen is soft.     Tenderness: There is abdominal tenderness (right rib area).  Musculoskeletal:     Cervical back: No rigidity. No muscular tenderness.     Right lower leg: No edema.     Left lower leg: No edema.     Comments: Right hip with pain when bearing wegiht. Pain on palpation. Normal pulse. No deformity. No bruising. No swelling Right 2nd toe with tenderness no swelling or bruising.  Right flank rib area with tenderness.   Lymphadenopathy:     Cervical: No cervical adenopathy.  Skin:    General: Skin is warm and dry.  Neurological:     General: No focal deficit present.     Mental Status: She is alert and oriented to person, place, and time. Mental status is at baseline.     Cranial Nerves: No cranial nerve deficit.  Psychiatric:        Mood and Affect: Mood normal.     Labs reviewed: Recent Labs    10/29/23 0000  NA 142  K 4.0  CL 107  CO2 24*  BUN 23*  CREATININE 0.9  CALCIUM 8.2*   Recent Labs    10/29/23 0000  AST 17  ALT 8  ALKPHOS 81  ALBUMIN 3.3*   No results for input(s): "WBC", "NEUTROABS", "HGB", "HCT", "MCV", "PLT" in the last 8760 hours. Lab Results  Component Value Date   TSH 1.91 11/21/2021   No results found for: "HGBA1C" Lab Results  Component Value Date   CHOL 241 (A) 09/10/2019   HDL 99 (A) 09/10/2019   LDLCALC 128 09/10/2019   TRIG 72 09/10/2019    Significant Diagnostic Results in last 30 days:  No results found.  Assessment/Plan  1. Right hip pain (Primary) Xray right hip Tylenol for pain ice  2. Rib  pain on right side Rib xray  Tylenol for pain ice  3. Gastroenteritis Rest, bland diet, encourage oral  intake Zofran prn If not improving report.    Family/ staff Communication: nurse  Labs/tests ordered:  xray

## 2024-03-18 ENCOUNTER — Encounter: Payer: Self-pay | Admitting: Orthopedic Surgery

## 2024-03-18 ENCOUNTER — Non-Acute Institutional Stay (SKILLED_NURSING_FACILITY): Payer: Self-pay | Admitting: Orthopedic Surgery

## 2024-03-18 DIAGNOSIS — R569 Unspecified convulsions: Secondary | ICD-10-CM | POA: Diagnosis not present

## 2024-03-18 DIAGNOSIS — G309 Alzheimer's disease, unspecified: Secondary | ICD-10-CM

## 2024-03-18 DIAGNOSIS — R0781 Pleurodynia: Secondary | ICD-10-CM

## 2024-03-18 DIAGNOSIS — F0150 Vascular dementia without behavioral disturbance: Secondary | ICD-10-CM | POA: Diagnosis not present

## 2024-03-18 DIAGNOSIS — F0280 Dementia in other diseases classified elsewhere without behavioral disturbance: Secondary | ICD-10-CM

## 2024-03-18 MED ORDER — ACETAMINOPHEN 500 MG PO TABS
1000.0000 mg | ORAL_TABLET | Freq: Every morning | ORAL | Status: AC
Start: 2024-03-18 — End: 2024-03-25

## 2024-03-18 NOTE — Progress Notes (Signed)
 Location:  [facility] Room Number: 315/A Place of Service:  SNF [phone]) Provider:  Arnetha Bhat, NP   Marguerite Shiley, MD  Patient Care Team: Marguerite Shiley, MD as PCP - General (Internal Medicine) Brian Campanile, MD (Inactive) as Consulting Physician (Neurology)  Extended Emergency Contact Information Primary Emergency Contact: [facility] Home Phone: [phone] Mobile Phone: [phone] Relation: Relative Secondary Emergency Contact: wright,Itzae Mobile Phone: [phone] Relation: Daughter  Code Status:  DNR Goals of care: Advanced Directive information    02/19/2024    4:13 PM  Advanced Directives  Does Patient Have a Medical Advance Directive? Yes  Type of Estate agent of [place];Out of facility DNR (pink MOST or yellow form)  Does patient want to make changes to medical advance directive? No - Patient declined  Copy of Healthcare Power of Attorney in Chart? Yes - validated most recent copy scanned in chart (See row information)     Chief Complaint  Patient presents with   Acute Visit    Right sided pain    HPI:  Pt is a 89 y.o. female seen today for acute visit due to right sided pain.   She currently resides on the memory care unit at [facility] due to AD. PMH: HTN, COPD, epilepsy, neuropathy, pseudogout,OA, osteoporosis, pelvic fracture 10/2022, open angle glaucoma.    04/07 she slipped and fell on bathroom floor. No head injury. She c/o right hip and flank pain after event. Xray right hip and pelvis negative for acute fracture or dislocation. Rib series negative for fracture. Poor historian due to dementia. She has had more falls recently. 04/09 she was started on lidocaine patches x 14 days for pain. Today, she c/o increased pain to right side, near waist, under axilla. No recent falls or injuries. She has been receiving tylenol 650 mg prn. Afebrile. Vitals stable.   No recent seizures,  remains on Lamictal.   No recent agitation. Resides on memory care unit. Remains on Aricept and Namenda. Dependent with ADLs except feeding.   Past Surgical History:  Procedure Laterality Date   APPENDECTOMY     BACK SURGERY     CATARACT EXTRACTION Left    OVARY SURGERY     PARATHYROIDECTOMY  9/   TRABECULECTOMY Left     Allergies  Allergen Reactions   Hctz [Hydrochlorothiazide]    Other Other (See Comments)    Caused sodium to drop    Outpatient Encounter Medications as of 03/18/2024  Medication Sig   acetaminophen (TYLENOL) 325 MG tablet Take 650 mg by mouth every 8 (eight) hours as needed.   alendronate (FOSAMAX) 70 MG tablet Take 70 mg by mouth once a week. Take with a full glass of water on an empty stomach. Friday   bimatoprost (LUMIGAN) 0.01 % SOLN Place 1 drop into both eyes at bedtime.   brimonidine (ALPHAGAN) 0.2 % ophthalmic solution Place 1 drop into the right eye 3 (three) times daily. Wait 10 minutes between drops. (Keep eyes closed for 2 minutes after each drop)   chlorhexidine (PERIDEX) 0.12 % solution Use as directed 5 mLs in the mouth or throat at bedtime. 1 tsp   Cholecalciferol (VITAMIN D3) 50 MCG (2000 UT) TABS Take 1 tablet by mouth daily.   cyanocobalamin 1000 MCG tablet Take 1,000 mcg by mouth in the morning.   docusate sodium (COLACE) 100 MG capsule Take 100 mg by mouth daily.   donepezil (ARICEPT) 10 MG tablet Take 10 mg by mouth at  bedtime.   dorzolamide-timolol (COSOPT) 22.3-6.8 MG/ML ophthalmic solution Place 1 drop into the right eye 2 (two) times daily. Wait 10 minutes between drops. (Keep eyes closed for 2 minutes after each drop)   lamoTRIgine (LAMICTAL) 100 MG tablet Take 1 tablet in morning, 1 and 1/2 tablets at night   memantine (NAMENDA) 5 MG tablet Take 10 mg by mouth 2 (two) times daily.   Multiple Vitamin (MULTIVITAMIN) tablet Take 1 tablet by mouth daily.   pilocarpine (PILOCAR) 4 % ophthalmic solution Place 1 drop into the right eye 4  (four) times daily. Wait 10 minutes between drops. (Keep eyes closed for 2 minutes after each drop)   polyethylene glycol (MIRALAX) 17 g packet Take 17 g by mouth every other day.   RHOPRESSA 0.02 % SOLN Apply 1 drop to eye every evening.   No facility-administered encounter medications on file as of 03/18/2024.    Review of Systems  Unable to perform ROS: Dementia    Immunization History  Administered Date(s) Administered   Fluad Quad(high Dose 65+) 09/24/2023   Influenza, High Dose Seasonal PF 09/26/2015, 09/03/2017, 08/28/2018, 09/23/2020, 09/07/2022   Influenza,inj,Quad PF,6+ Mos 10/01/2019   Influenza-Unspecified 09/22/2009, 08/27/2011, 10/08/2012, 12/08/2014, 02/10/2018, 09/06/2021   Moderna Covid-19 Fall Seasonal Vaccine 76yrs & older 10/08/2022   Moderna Covid-19 Vaccine Bivalent Booster 57yrs & up 04/19/2022, 09/24/2023   Moderna Sars-Covid-2 Vaccination 12/14/2019, 01/12/2020, 09/20/2020, 10/13/2020, 10/08/2022   Pneumococcal Conjugate-13 12/31/2013   Pneumococcal Polysaccharide-23 12/04/1999, 02/02/2016   Td 12/04/2000   Tdap 11/07/2012, 01/18/2023   Zoster Recombinant(Shingrix) 04/01/2022, 06/01/2022   Pertinent  Health Maintenance Due  Topic Date Due   INFLUENZA VACCINE  07/03/2024   DEXA SCAN  Completed      12/11/2022   11:10 AM 02/19/2023    2:40 PM 04/11/2023   11:32 AM 08/13/2023   12:36 PM 12/31/2023    1:36 PM  Fall Risk  Falls in the past year? 0 1 1 1 1   Was there an injury with Fall? 0 0 1 1 0  Fall Risk Category Calculator 0 1 2 2 2   Fall Risk Category (Retired) Low      (RETIRED) Patient Fall Risk Level Low fall risk      Patient at Risk for Falls Due to No Fall Risks History of fall(s);Impaired balance/gait;Impaired mobility History of fall(s) History of fall(s);Impaired balance/gait History of fall(s);Impaired balance/gait  Fall risk Follow up Falls evaluation completed Falls evaluation completed;Education provided;Falls prevention discussed Falls  evaluation completed Falls evaluation completed;Education provided;Falls prevention discussed Falls evaluation completed;Education provided   Functional Status Survey:    Vitals:   03/18/24 1453  BP: 119/81  Pulse: 89  Resp: 16  Temp: (!) 97.1 F (36.2 C)  SpO2: 98%  Weight: 134 lb 6.4 oz (61 kg)  Height: 5\' 5"  (1.651 m)   Body mass index is 22.37 kg/m. Physical Exam Vitals reviewed.  Constitutional:      General: She is not in acute distress. HENT:     Head: Normocephalic and atraumatic.  Eyes:     General:        Right eye: No discharge.        Left eye: No discharge.  Cardiovascular:     Rate and Rhythm: Normal rate and regular rhythm.     Pulses: Normal pulses.     Heart sounds: Normal heart sounds.  Pulmonary:     Effort: Pulmonary effort is normal.     Breath sounds: Normal breath sounds.  Chest:  Chest wall: Tenderness present. No deformity or swelling.     Comments: Increased pain right lower rib cage, under axilla, near flank. No bruising or skin breakdown. Able to take deep breath without difficulty/pain.  Abdominal:     General: Bowel sounds are normal.     Palpations: Abdomen is soft.  Musculoskeletal:        General: Normal range of motion.     Cervical back: Neck supple.     Thoracic back: Normal.     Lumbar back: Normal.     Right hip: No deformity, tenderness or crepitus. Normal range of motion. Normal strength.     Comments: WBAT as tolerated with assist  Skin:    General: Skin is warm.     Capillary Refill: Capillary refill takes less than 2 seconds.  Neurological:     General: No focal deficit present.     Mental Status: She is alert. Mental status is at baseline.     Gait: Gait abnormal.  Psychiatric:        Mood and Affect: Mood normal.     Labs reviewed: Recent Labs    10/29/23 0000  NA 142  K 4.0  CL 107  CO2 24*  BUN 23*  CREATININE 0.9  CALCIUM 8.2*   Recent Labs    10/29/23 0000  AST 17  ALT 8  ALKPHOS 81   ALBUMIN 3.3*   No results for input(s): "WBC", "NEUTROABS", "HGB", "HCT", "MCV", "PLT" in the last 8760 hours. Lab Results  Component Value Date   TSH 1.91 11/21/2021   No results found for: "HGBA1C" Lab Results  Component Value Date   CHOL 241 (A) 09/10/2019   HDL 99 (A) 09/10/2019   LDLCALC 128 09/10/2019   TRIG 72 09/10/2019    Significant Diagnostic Results in last 30 days:  No results found.  Assessment/Plan 1. Rib pain on right side (Primary) - fall 04/07 - initial right hip/pelvis/ rib series negative for acute fracture or dislocation - 04/09 lidocaine patches started x 14 days - continues to c/o right rib cage pain - if pain worsens> repeat rib series - cont tylenol 650 mg prn - start tylenol 1000 mg po QAM x 7 days  2. Seizure (HCC) - no recent seizures - cont Lamictal  3. Mixed Alzheimer's and vascular dementia (HCC) - resides on memory care - no agitation - dependent with ADLs except feeding - unstable gait/ falling more often - cont Aricept and Namenda    Family/ staff Communication: plan discussed with patient and nurse  Labs/tests ordered:  none

## 2024-03-26 ENCOUNTER — Non-Acute Institutional Stay (SKILLED_NURSING_FACILITY): Payer: Self-pay | Admitting: Adult Health

## 2024-03-26 DIAGNOSIS — M810 Age-related osteoporosis without current pathological fracture: Secondary | ICD-10-CM

## 2024-03-26 DIAGNOSIS — E538 Deficiency of other specified B group vitamins: Secondary | ICD-10-CM

## 2024-03-26 DIAGNOSIS — N1832 Chronic kidney disease, stage 3b: Secondary | ICD-10-CM | POA: Diagnosis not present

## 2024-03-26 DIAGNOSIS — G309 Alzheimer's disease, unspecified: Secondary | ICD-10-CM

## 2024-03-26 DIAGNOSIS — F0280 Dementia in other diseases classified elsewhere without behavioral disturbance: Secondary | ICD-10-CM

## 2024-03-26 DIAGNOSIS — E042 Nontoxic multinodular goiter: Secondary | ICD-10-CM

## 2024-03-26 DIAGNOSIS — F0150 Vascular dementia without behavioral disturbance: Secondary | ICD-10-CM

## 2024-03-26 DIAGNOSIS — H401122 Primary open-angle glaucoma, left eye, moderate stage: Secondary | ICD-10-CM

## 2024-03-27 ENCOUNTER — Encounter: Payer: Self-pay | Admitting: Adult Health

## 2024-03-27 NOTE — Progress Notes (Signed)
 Location:  Medical illustrator of Service:  SNF (31) Provider:  Raylene Calamity, NP   Patient Care Team: Marguerite Shiley, MD as PCP - General (Internal Medicine) Brian Campanile, MD (Inactive) as Consulting Physician (Neurology)  Extended Emergency Contact Information Primary Emergency Contact: [facility] Home Phone: [phone] Mobile Phone: [phone] Relation: Relative Secondary Emergency Contact: wright,Yulitza Mobile Phone: [phone] Relation: Daughter  Code Status:  DNR Goals of care: Advanced Directive information    02/19/2024    4:13 PM  Advanced Directives  Does Patient Have a Medical Advance Directive? Yes  Type of Estate agent of [place];Out of facility DNR (pink MOST or yellow form)  Does patient want to make changes to medical advance directive? No - Patient declined  Copy of Healthcare Power of Attorney in Chart? Yes - validated most recent copy scanned in chart (See row information)     Chief Complaint  Patient presents with   Medical Management of Chronic Issues    HPI:  Pt is a 89 y.o. female seen today for medical management of chronic diseases.    PMH significant for glaucoma, seizures, arthritis, HTN, low back pain, dementia, MGUS, h/x of parathyroidectomy, osteoporosis, and peripheral neuropathy.   Resides in memory care due to progressive dementia. No acute behavioral concerns. Spends most of the day in her room.  Remains ambulatory. MMSE 18/30 05/2023  Followed by ophthalmology for glaucoma Hx of seizures, no new events.  On fosamax for OP since 04/2021, T score -5.2 prior hx of wrist and pelvic fractures.  Prior hx of thyroid  nodules not worked up due to age/goals of care.  Hx of MGUS with neuropathy. No current symptoms. Walks with a walker Has some chronic knee pain due to OA unchanged.  Past Medical History:  Diagnosis Date   Arthritis    Per [facility] new patient packet    Epilepsy  [facility])    Per [facility] new patient packet    Glaucoma    Hearing deficit    Hearing aids   High blood pressure    Per [facility] new patient packet    History of abnormal weight loss    Low back pain    Memory difficulty 05/09/2016   MGUS (monoclonal gammopathy of unknown significance)    Neuropathy associated with MGUS (HCC) 11/09/2015   Radiculopathy of sacral and sacrococcygeal region    S1   Seizures (HCC)    Past Surgical History:  Procedure Laterality Date   APPENDECTOMY     BACK SURGERY     CATARACT EXTRACTION Left    OVARY SURGERY     PARATHYROIDECTOMY  9/   TRABECULECTOMY Left     Allergies  Allergen Reactions   Hctz [Hydrochlorothiazide]    Other Other (See Comments)    Caused sodium to drop    Outpatient Encounter Medications as of 03/26/2024  Medication Sig   acetaminophen  (TYLENOL ) 325 MG tablet Take 650 mg by mouth every 8 (eight) hours as needed.   alendronate (FOSAMAX) 70 MG tablet Take 70 mg by mouth once a week. Take with a full glass of water on an empty stomach. Friday   bimatoprost (LUMIGAN) 0.01 % SOLN Place 1 drop into both eyes at bedtime.   brimonidine (ALPHAGAN) 0.2 % ophthalmic solution Place 1 drop into the right eye 3 (three) times daily. Wait 10 minutes between drops. (Keep eyes closed for 2 minutes after each drop)   chlorhexidine (PERIDEX) 0.12 % solution Use as directed 5  mLs in the mouth or throat at bedtime. 1 tsp   Cholecalciferol (VITAMIN D3) 50 MCG (2000 UT) TABS Take 1 tablet by mouth daily.   cyanocobalamin 1000 MCG tablet Take 1,000 mcg by mouth in the morning.   docusate sodium (COLACE) 100 MG capsule Take 100 mg by mouth daily.   donepezil (ARICEPT) 10 MG tablet Take 10 mg by mouth at bedtime.   dorzolamide-timolol (COSOPT) 22.3-6.8 MG/ML ophthalmic solution Place 1 drop into the right eye 2 (two) times daily. Wait 10 minutes between drops. (Keep eyes closed for 2 minutes after each drop)   lamoTRIgine  (LAMICTAL ) 100 MG tablet Take 1 tablet in  morning, 1 and 1/2 tablets at night   memantine (NAMENDA) 5 MG tablet Take 10 mg by mouth 2 (two) times daily.   Multiple Vitamin (MULTIVITAMIN) tablet Take 1 tablet by mouth daily.   pilocarpine (PILOCAR) 4 % ophthalmic solution Place 1 drop into the right eye 4 (four) times daily. Wait 10 minutes between drops. (Keep eyes closed for 2 minutes after each drop)   polyethylene glycol (MIRALAX ) 17 g packet Take 17 g by mouth every other day.   RHOPRESSA 0.02 % SOLN Apply 1 drop to eye every evening.   No facility-administered encounter medications on file as of 03/26/2024.    Review of Systems  Constitutional:  Negative for activity change, appetite change, chills, diaphoresis, fatigue, fever and unexpected weight change.  HENT:  Negative for congestion.   Respiratory:  Negative for cough, shortness of breath and wheezing.   Cardiovascular:  Negative for chest pain, palpitations and leg swelling.  Gastrointestinal:  Negative for abdominal distention, abdominal pain, constipation and diarrhea.  Genitourinary:  Negative for difficulty urinating and dysuria.  Musculoskeletal:  Positive for gait problem (uses walker). Negative for arthralgias, back pain, joint swelling and myalgias.  Neurological:  Negative for dizziness, tremors, seizures, syncope, facial asymmetry, speech difficulty, weakness, light-headedness, numbness and headaches.  Psychiatric/Behavioral:  Negative for agitation, behavioral problems and confusion.     Immunization History  Administered Date(s) Administered   Fluad Quad(high Dose 65+) 09/24/2023   Influenza, High Dose Seasonal PF 09/26/2015, 09/03/2017, 08/28/2018, 09/23/2020, 09/07/2022   Influenza,inj,Quad PF,6+ Mos 10/01/2019   Influenza-Unspecified 09/22/2009, 08/27/2011, 10/08/2012, 12/08/2014, 02/10/2018, 09/06/2021   Moderna Covid-19 Fall Seasonal Vaccine 62yrs & older 10/08/2022   Moderna Covid-19 Vaccine Bivalent Booster 68yrs & up 04/19/2022, 09/24/2023    Moderna Sars-Covid-2 Vaccination 12/14/2019, 01/12/2020, 09/20/2020, 10/13/2020, 10/08/2022   Pneumococcal Conjugate-13 12/31/2013   Pneumococcal Polysaccharide-23 12/04/1999, 02/02/2016   Td 12/04/2000   Tdap 11/07/2012, 01/18/2023   Zoster Recombinant(Shingrix) 04/01/2022, 06/01/2022   Pertinent  Health Maintenance Due  Topic Date Due   INFLUENZA VACCINE  07/03/2024   DEXA SCAN  Completed      12/11/2022   11:10 AM 02/19/2023    2:40 PM 04/11/2023   11:32 AM 08/13/2023   12:36 PM 12/31/2023    1:36 PM  Fall Risk  Falls in the past year? 0 1 1 1 1   Was there an injury with Fall? 0 0 1 1 0  Fall Risk Category Calculator 0 1 2 2 2   Fall Risk Category (Retired) Low      (RETIRED) Patient Fall Risk Level Low fall risk      Patient at Risk for Falls Due to No Fall Risks History of fall(s);Impaired balance/gait;Impaired mobility History of fall(s) History of fall(s);Impaired balance/gait History of fall(s);Impaired balance/gait  Fall risk Follow up Falls evaluation completed Falls evaluation completed;Education provided;Falls prevention discussed  Falls evaluation completed Falls evaluation completed;Education provided;Falls prevention discussed Falls evaluation completed;Education provided   Functional Status Survey:    Vitals:   03/27/24 0838  BP: 116/68  Pulse: 77  Resp: 16  Temp: (!) 97.2 F (36.2 C)  SpO2: 97%  Weight: 134 lb 6.4 oz (61 kg)   Body mass index is 22.37 kg/m. Physical Exam Vitals and nursing note reviewed.  Constitutional:      General: She is not in acute distress.    Appearance: She is not diaphoretic.  HENT:     Head: Normocephalic and atraumatic.     Nose: Nose normal.  Eyes:     Conjunctiva/sclera: Conjunctivae normal.     Pupils: Pupils are equal, round, and reactive to light.     Comments: Erythema to right sclera. No drainage or swelling.   Neck:     Vascular: No JVD.  Cardiovascular:     Rate and Rhythm: Normal rate and regular rhythm.      Heart sounds: No murmur heard. Pulmonary:     Effort: Pulmonary effort is normal. No respiratory distress.     Breath sounds: Normal breath sounds. No wheezing.  Abdominal:     General: Bowel sounds are normal. There is no distension.     Palpations: Abdomen is soft.     Tenderness: There is no abdominal tenderness.  Musculoskeletal:        General: No swelling, tenderness, deformity or signs of injury.     Right lower leg: Edema (+1) present.     Left lower leg: Edema (+1) present.  Skin:    General: Skin is warm and dry.  Neurological:     General: No focal deficit present.     Mental Status: She is alert. Mental status is at baseline.  Psychiatric:        Mood and Affect: Mood normal.     Labs reviewed: Recent Labs    10/29/23 0000  NA 142  K 4.0  CL 107  CO2 24*  BUN 23*  CREATININE 0.9  CALCIUM 8.2*   Recent Labs    10/29/23 0000  AST 17  ALT 8  ALKPHOS 81  ALBUMIN 3.3*   No results for input(s): "WBC", "NEUTROABS", "HGB", "HCT", "MCV", "PLT" in the last 8760 hours. Lab Results  Component Value Date   TSH 1.91 11/21/2021   No results found for: "HGBA1C" Lab Results  Component Value Date   CHOL 241 (A) 09/10/2019   HDL 99 (A) 09/10/2019   LDLCALC 128 09/10/2019   TRIG 72 09/10/2019    Significant Diagnostic Results in last 30 days:  No results found.  Assessment/Plan  1. Mixed Alzheimer's and vascular dementia (HCC) Progressive decline in cognition and physical function c/w the disease. Continue supportive care in the skilled environment.  2. Primary open angle glaucoma (POAG) of both eyes, moderate stage Followed by Ophthalmology seen in Jan for annual visit.   3. Senile osteoporosis Continue fosamax started 2022 On vit d supplementation Check labs Not repeating dexa at this time due to goals of care, age, dementia  4. Generalized convulsive epilepsy without intractable epilepsy (HCC) No new Continue lamictal    5. Stage 3b chronic  kidney disease (HCC) Continue to periodically monitor BMP and avoid nephrotoxic agents  6. Vitamin B 12 deficiency Continue B12 1000 mcg daily  Lab Results  Component Value Date   HGB 12.0 10/12/2022   Hx of parathyroidectomy check Ca   Family/ staff Communication: nurse  Labs/tests ordered:  TSH VIT b12 Vit D CMP CBC

## 2024-03-30 ENCOUNTER — Telehealth: Admitting: Internal Medicine

## 2024-03-30 DIAGNOSIS — H401133 Primary open-angle glaucoma, bilateral, severe stage: Secondary | ICD-10-CM | POA: Diagnosis not present

## 2024-03-30 DIAGNOSIS — H26491 Other secondary cataract, right eye: Secondary | ICD-10-CM | POA: Diagnosis not present

## 2024-03-30 DIAGNOSIS — M810 Age-related osteoporosis without current pathological fracture: Secondary | ICD-10-CM | POA: Diagnosis not present

## 2024-03-30 DIAGNOSIS — I10 Essential (primary) hypertension: Secondary | ICD-10-CM | POA: Diagnosis not present

## 2024-03-30 DIAGNOSIS — D519 Vitamin B12 deficiency anemia, unspecified: Secondary | ICD-10-CM | POA: Diagnosis not present

## 2024-03-30 LAB — BASIC METABOLIC PANEL WITH GFR
BUN: 10 (ref 4–21)
CO2: 21 (ref 13–22)
Chloride: 105 (ref 99–108)
Creatinine: 0.9 (ref 0.5–1.1)
Glucose: 85
Potassium: 3.9 meq/L (ref 3.5–5.1)
Sodium: 139 (ref 137–147)

## 2024-03-30 LAB — HEPATIC FUNCTION PANEL
ALT: 14 U/L (ref 7–35)
AST: 20 (ref 13–35)
Alkaline Phosphatase: 96 (ref 25–125)

## 2024-03-30 LAB — CBC AND DIFFERENTIAL
HCT: 27 — AB (ref 36–46)
Hemoglobin: 8.7 — AB (ref 12.0–16.0)
Platelets: 230 10*3/uL (ref 150–400)
WBC: 5.3

## 2024-03-30 LAB — COMPREHENSIVE METABOLIC PANEL WITH GFR
Albumin: 3.7 (ref 3.5–5.0)
Calcium: 8.4 — AB (ref 8.7–10.7)
Globulin: 2.6
eGFR: 63

## 2024-03-30 LAB — TSH: TSH: 1.36 (ref 0.41–5.90)

## 2024-03-30 LAB — VITAMIN D 25 HYDROXY (VIT D DEFICIENCY, FRACTURES): Vit D, 25-Hydroxy: 57.8

## 2024-03-30 LAB — VITAMIN B12: Vitamin B-12: 1863

## 2024-03-30 LAB — CBC: RBC: 3.2 — AB (ref 3.87–5.11)

## 2024-03-30 NOTE — Telephone Encounter (Signed)
 Patients Labs came back and her Hgb is 8.7 Which is change from her last HGB of 10 Will Recheck it in 2 days also do Iron studies Check Stool for Occult blood Not on any concerning Meds B 12 is 1863

## 2024-04-02 ENCOUNTER — Encounter: Payer: Self-pay | Admitting: Adult Health

## 2024-04-02 ENCOUNTER — Non-Acute Institutional Stay (SKILLED_NURSING_FACILITY): Payer: Self-pay | Admitting: Adult Health

## 2024-04-02 DIAGNOSIS — D519 Vitamin B12 deficiency anemia, unspecified: Secondary | ICD-10-CM | POA: Diagnosis not present

## 2024-04-02 DIAGNOSIS — E039 Hypothyroidism, unspecified: Secondary | ICD-10-CM | POA: Diagnosis not present

## 2024-04-02 DIAGNOSIS — I639 Cerebral infarction, unspecified: Secondary | ICD-10-CM | POA: Diagnosis not present

## 2024-04-02 DIAGNOSIS — N1832 Chronic kidney disease, stage 3b: Secondary | ICD-10-CM | POA: Diagnosis not present

## 2024-04-02 DIAGNOSIS — M79672 Pain in left foot: Secondary | ICD-10-CM | POA: Diagnosis not present

## 2024-04-02 DIAGNOSIS — D649 Anemia, unspecified: Secondary | ICD-10-CM | POA: Diagnosis not present

## 2024-04-02 DIAGNOSIS — I10 Essential (primary) hypertension: Secondary | ICD-10-CM | POA: Diagnosis not present

## 2024-04-02 LAB — IRON,TIBC AND FERRITIN PANEL
%SAT: 4.56
Ferritin: 16.5
Iron: 16
TIBC: 349
UIBC: 333

## 2024-04-02 LAB — CBC
RBC: 3.05 — AB (ref 3.87–5.11)
RBC: 3.05 — AB (ref 3.87–5.11)

## 2024-04-02 LAB — CBC AND DIFFERENTIAL
HCT: 26 — AB (ref 36–46)
HCT: 26 — AB (ref 36–46)
Hemoglobin: 8.2 — AB (ref 12.0–16.0)
Hemoglobin: 8.2 — AB (ref 12.0–16.0)
Platelets: 217 10*3/uL (ref 150–400)
Platelets: 217 10*3/uL (ref 150–400)
WBC: 6
WBC: 6

## 2024-04-02 MED ORDER — IRON (FERROUS SULFATE) 325 (65 FE) MG PO TABS: 325.0000 mg | ORAL_TABLET | Freq: Every day | ORAL | Status: DC

## 2024-04-02 NOTE — Progress Notes (Addendum)
 Location:  Medical illustrator of Service:  SNF (31) Provider:   Janace Mckusick, ANP [facility] [phone]   Marguerite Shiley, MD  Patient Care Team: Marguerite Shiley, MD as PCP - General (Internal Medicine) Brian Campanile, MD (Inactive) as Consulting Physician (Neurology)  Extended Emergency Contact Information Primary Emergency Contact: [facility] Home Phone: [phone] Mobile Phone: [phone] Relation: Relative Secondary Emergency Contact: wright,Lynore Mobile Phone: [phone] Relation: Daughter  Code Status:  DNR Goals of care: Advanced Directive information    02/19/2024    4:13 PM  Advanced Directives  Does Patient Have a Medical Advance Directive? Yes  Type of Estate agent of [place];Out of facility DNR (pink MOST or yellow form)  Does patient want to make changes to medical advance directive? No - Patient declined  Copy of Healthcare Power of Attorney in Chart? Yes - validated most recent copy scanned in chart (See row information)     Chief Complaint  Patient presents with   Acute Visit    fall    HPI:  The patient is a 89 year old with dementia who presents after a fall with bilateral foot pain.  She was found on the floor early this morning after attempting to get up without assistance. She experiences pain in both the right and left foot following the fall. No hip pain or difficulty bearing weight. She does not appear to have hit her head. She has a history of falls.  Her left foot is swollen and bruised on the dorsum and is very tender to touch. Her right toes are tender to touch but show no swelling or bruising.  She has a history of anemia with a recent hemoglobin level of 8.7, which is a drop from her baseline of 10. Her MCV is 84.2, and B12 level is 1863.  She is currently on Aricept. She is not on Plavix or aspirin. She resides in the skilled care area of [facility]. Past  Medical History:  Diagnosis Date   Arthritis    Per [facility] new patient packet    Epilepsy [facility])    Per [facility] new patient packet    Glaucoma    Hearing deficit    Hearing aids   High blood pressure    Per [facility] new patient packet    History of abnormal weight loss    Low back pain    Memory difficulty 05/09/2016   MGUS (monoclonal gammopathy of unknown significance)    Neuropathy associated with MGUS (HCC) 11/09/2015   Radiculopathy of sacral and sacrococcygeal region    S1   Seizures (HCC)    Past Surgical History:  Procedure Laterality Date   APPENDECTOMY     BACK SURGERY     CATARACT EXTRACTION Left    OVARY SURGERY     PARATHYROIDECTOMY  9/   TRABECULECTOMY Left     Allergies  Allergen Reactions   Hctz [Hydrochlorothiazide]    Other Other (See Comments)    Caused sodium to drop    Outpatient Encounter Medications as of 04/02/2024  Medication Sig   acetaminophen  (TYLENOL ) 325 MG tablet Take 650 mg by mouth every 8 (eight) hours as needed.   alendronate (FOSAMAX) 70 MG tablet Take 70 mg by mouth once a week. Take with a full glass of water on an empty stomach. Friday   bimatoprost (LUMIGAN) 0.01 % SOLN Place 1 drop into both eyes at bedtime.   brimonidine (ALPHAGAN) 0.2 % ophthalmic solution  Place 1 drop into the right eye 3 (three) times daily. Wait 10 minutes between drops. (Keep eyes closed for 2 minutes after each drop)   chlorhexidine (PERIDEX) 0.12 % solution Use as directed 5 mLs in the mouth or throat at bedtime. 1 tsp   Cholecalciferol (VITAMIN D3) 50 MCG (2000 UT) TABS Take 1 tablet by mouth daily.   cyanocobalamin 1000 MCG tablet Take 1,000 mcg by mouth in the morning.   docusate sodium (COLACE) 100 MG capsule Take 100 mg by mouth daily.   donepezil (ARICEPT) 10 MG tablet Take 10 mg by mouth at bedtime.   dorzolamide-timolol (COSOPT) 22.3-6.8 MG/ML ophthalmic solution Place 1 drop into the right eye 2 (two) times daily. Wait 10 minutes between drops. (Keep eyes closed  for 2 minutes after each drop)   lamoTRIgine  (LAMICTAL ) 100 MG tablet Take 1 tablet in morning, 1 and 1/2 tablets at night   memantine (NAMENDA) 5 MG tablet Take 10 mg by mouth 2 (two) times daily.   Multiple Vitamin (MULTIVITAMIN) tablet Take 1 tablet by mouth daily.   pilocarpine (PILOCAR) 4 % ophthalmic solution Place 1 drop into the right eye 4 (four) times daily. Wait 10 minutes between drops. (Keep eyes closed for 2 minutes after each drop)   polyethylene glycol (MIRALAX ) 17 g packet Take 17 g by mouth every other day.   RHOPRESSA 0.02 % SOLN Apply 1 drop to eye every evening.   No facility-administered encounter medications on file as of 04/02/2024.    Review of Systems  Immunization History  Administered Date(s) Administered   Fluad Quad(high Dose 65+) 09/24/2023   Influenza, High Dose Seasonal PF 09/26/2015, 09/03/2017, 08/28/2018, 09/23/2020, 09/07/2022   Influenza,inj,Quad PF,6+ Mos 10/01/2019   Influenza-Unspecified 09/22/2009, 08/27/2011, 10/08/2012, 12/08/2014, 02/10/2018, 09/06/2021   Moderna Covid-19 Fall Seasonal Vaccine 50yrs & older 10/08/2022   Moderna Covid-19 Vaccine Bivalent Booster 66yrs & up 04/19/2022, 09/24/2023   Moderna Sars-Covid-2 Vaccination 12/14/2019, 01/12/2020, 09/20/2020, 10/13/2020, 10/08/2022   Pneumococcal Conjugate-13 12/31/2013   Pneumococcal Polysaccharide-23 12/04/1999, 02/02/2016   Td 12/04/2000   Tdap 11/07/2012, 01/18/2023   Zoster Recombinant(Shingrix) 04/01/2022, 06/01/2022   Pertinent  Health Maintenance Due  Topic Date Due   INFLUENZA VACCINE  07/03/2024   DEXA SCAN  Completed      12/11/2022   11:10 AM 02/19/2023    2:40 PM 04/11/2023   11:32 AM 08/13/2023   12:36 PM 12/31/2023    1:36 PM  Fall Risk  Falls in the past year? 0 1 1 1 1   Was there an injury with Fall? 0 0 1 1 0  Fall Risk Category Calculator 0 1 2 2 2   Fall Risk Category (Retired) Low      (RETIRED) Patient Fall Risk Level Low fall risk      Patient at Risk for  Falls Due to No Fall Risks History of fall(s);Impaired balance/gait;Impaired mobility History of fall(s) History of fall(s);Impaired balance/gait History of fall(s);Impaired balance/gait  Fall risk Follow up Falls evaluation completed Falls evaluation completed;Education provided;Falls prevention discussed Falls evaluation completed Falls evaluation completed;Education provided;Falls prevention discussed Falls evaluation completed;Education provided   Functional Status Survey:    Vitals:   04/02/24 1500  BP: 128/70  Pulse: 72  Resp: 20  Temp: (!) 97.2 F (36.2 C)  SpO2: 94%   There is no height or weight on file to calculate BMI. Physical Exam Vitals and nursing note reviewed.  Constitutional:      General: She is not in acute distress.  Appearance: Normal appearance. She is not diaphoretic.  HENT:     Head: Normocephalic and atraumatic.  Eyes:     Conjunctiva/sclera: Conjunctivae normal.     Pupils: Pupils are equal, round, and reactive to light.  Neck:     Thyroid : No thyromegaly.     Vascular: No carotid bruit or JVD.  Cardiovascular:     Rate and Rhythm: Normal rate and regular rhythm.     Heart sounds: Normal heart sounds. No murmur heard. Pulmonary:     Effort: Pulmonary effort is normal. No respiratory distress.     Breath sounds: Normal breath sounds. No stridor.  Abdominal:     General: Bowel sounds are normal. There is no distension.     Palpations: Abdomen is soft.     Tenderness: There is no abdominal tenderness. There is no right CVA tenderness or left CVA tenderness.  Musculoskeletal:        General: Swelling (left dorsum of foot), tenderness and signs of injury (left dorsum of foot) present.     Cervical back: No rigidity. No muscular tenderness.     Right lower leg: No edema.     Left lower leg: No edema.     Comments: ROM without pain to bilateral knees, hips, elbows, shoulders.  Right foot with mild tenderness to digits 2-5 no brusiing or swelling.   Pain and tenderness left foot dorsum  Lymphadenopathy:     Cervical: No cervical adenopathy.  Skin:    General: Skin is warm and dry.  Neurological:     General: No focal deficit present.     Mental Status: She is alert. Mental status is at baseline.     Cranial Nerves: No cranial nerve deficit.  Psychiatric:        Mood and Affect: Mood normal.     Labs reviewed: Recent Labs    10/29/23 0000  NA 142  K 4.0  CL 107  CO2 24*  BUN 23*  CREATININE 0.9  CALCIUM 8.2*   Recent Labs    10/29/23 0000  AST 17  ALT 8  ALKPHOS 81  ALBUMIN 3.3*   No results for input(s): "WBC", "NEUTROABS", "HGB", "HCT", "MCV", "PLT" in the last 8760 hours. Lab Results  Component Value Date   TSH 1.91 11/21/2021   No results found for: "HGBA1C" Lab Results  Component Value Date   CHOL 241 (A) 09/10/2019   HDL 99 (A) 09/10/2019   LDLCALC 128 09/10/2019   TRIG 72 09/10/2019    Significant Diagnostic Results in last 30 days:  No results found.  Assessment/Plan Left foot swelling and bruising Swelling and bruising on the dorsum of the left foot with tenderness. Differential includes contusion versus metatarsal fracture. Right foot is unremarkable at this time.  - Order left foot X-ray. - Apply ice to the left foot three times a day for 48 hours. - Administer acetaminophen  as needed for pain.  Anemia, unspecified Hemoglobin level decreased to 8.7 from baseline of 10. MCV is 84.2, B12 is 1863, CMP normal. Awaiting CBC, iron  studies, and hemoccult stool test. Aggressive workup not recommended due to dementia. - Follow up on hemoccult stool test. - Review iron  studies results. - Consider starting iron  supplementation if needed.     Family/ staff Communication: nurse  Labs/tests ordered:  xray left foot    Addendum: Hgb 8.2 MC 84.2 04/02/24, iron  16, TIBC 349, Iron  sat 4.56 Ferritin 16.5 Start ferrous sulfate  daily Repeat CBC 2 weeks

## 2024-04-02 NOTE — Addendum Note (Signed)
 Addended by: Americo Baker on: 04/02/2024 04:23 PM   Modules accepted: Orders

## 2024-04-03 ENCOUNTER — Telehealth: Payer: Self-pay | Admitting: Adult Health

## 2024-04-03 MED ORDER — PANTOPRAZOLE SODIUM 40 MG PO TBEC: 40.0000 mg | DELAYED_RELEASE_TABLET | Freq: Every day | ORAL | Status: DC

## 2024-04-03 NOTE — Telephone Encounter (Signed)
 Left foot xray did not show a fracture but did indicate "partial resection of the first metatarsal".  She does not have a documented hx of foot surgery or signs of this on exam. She is not currently in pain. Will have her f/u with orthopedics if not improving The nurse reported a heme pos stool that I also reviewed my self. I discussed her anemia and heme pos stool with her POA [name]. We agreed that due to her age and dementia she would not benefit from an aggressive work up. Her goals of care are comfort based. Will add protonix to her regimen. F/U CBC ordered. One more stool is pending.

## 2024-04-14 ENCOUNTER — Non-Acute Institutional Stay (SKILLED_NURSING_FACILITY): Admitting: Orthopedic Surgery

## 2024-04-14 ENCOUNTER — Encounter: Payer: Self-pay | Admitting: Orthopedic Surgery

## 2024-04-14 DIAGNOSIS — R404 Transient alteration of awareness: Secondary | ICD-10-CM | POA: Diagnosis not present

## 2024-04-14 DIAGNOSIS — R569 Unspecified convulsions: Secondary | ICD-10-CM

## 2024-04-14 DIAGNOSIS — G309 Alzheimer's disease, unspecified: Secondary | ICD-10-CM

## 2024-04-14 DIAGNOSIS — D649 Anemia, unspecified: Secondary | ICD-10-CM

## 2024-04-14 DIAGNOSIS — F0150 Vascular dementia without behavioral disturbance: Secondary | ICD-10-CM

## 2024-04-14 DIAGNOSIS — F0280 Dementia in other diseases classified elsewhere without behavioral disturbance: Secondary | ICD-10-CM

## 2024-04-14 NOTE — Progress Notes (Signed)
 [facility] Room Number: 315 Place of Service:  SNF (31) Provider:  Arnetha Bhat, NP   Patient Care Team: Marguerite Shiley, MD as PCP - General (Internal Medicine) Brian Campanile, MD (Inactive) as Consulting Physician (Neurology)  Extended Emergency Contact Information Primary Emergency Contact: [facility] Home Phone: [phone] Mobile Phone: [phone] Relation: Relative Secondary Emergency Contact: wright,Ariani Mobile Phone: [phone] Relation: Daughter  Code Status:  DNR Goals of care: Advanced Directive information    02/19/2024    4:13 PM  Advanced Directives  Does Patient Have a Medical Advance Directive? Yes  Type of Estate agent of [place];Out of facility DNR (pink MOST or yellow form)  Does patient want to make changes to medical advance directive? No - Patient declined  Copy of Healthcare Power of Attorney in Chart? Yes - validated most recent copy scanned in chart (See row information)     Chief Complaint  Patient presents with   Unresponsive Episode     HPI:  Pt is a 89 y.o. female seen today for acute visit due to unresponsive episode.   She currently resides on the memory care unit at [facility] due to AD. PMH: HTN, COPD, epilepsy, neuropathy, pseudogout,OA, osteoporosis, pelvic fracture 10/2022, open angle glaucoma.   This morning she was eating breakfast when she became unresponsive < 1 minute at breakfast table. Her face was pale. She became responsive with sternal chest rub. Vitals after event: 96.8, HR 69, RR 16, O2 sat 94%,113/60. Repeat vitals: 97.0, HR 74, RR 20, O2 sat 94%, 109/60. Blood sugar was not captured by nursing. Blood sugar during examination was 180. She was laying in bed during exam. Admitted to feeling tired. She was appropriate and able to answer simple questions. She remains on Lamictal  due to past seizures. Vitals stable.   05/03 ferrous sulfate  and  Protonix  for suspected blood loss anemia. Hgb dropped from 8.7 ( 04/28)> to 8.2 (05/01). 05/02 hemoccult was positive.   Past Medical History:  Diagnosis Date   Arthritis    Per [facility] new patient packet    Epilepsy [facility])    Per [facility] new patient packet    Glaucoma    Hearing deficit    Hearing aids   High blood pressure    Per [facility] new patient packet    History of abnormal weight loss    Low back pain    Memory difficulty 05/09/2016   MGUS (monoclonal gammopathy of unknown significance)    Neuropathy associated with MGUS (HCC) 11/09/2015   Radiculopathy of sacral and sacrococcygeal region    S1   Seizures (HCC)    Past Surgical History:  Procedure Laterality Date   APPENDECTOMY     BACK SURGERY     CATARACT EXTRACTION Left    OVARY SURGERY     PARATHYROIDECTOMY  9/   TRABECULECTOMY Left     Allergies  Allergen Reactions   Hctz [Hydrochlorothiazide]    Other Other (See Comments)    Caused sodium to drop    Outpatient Encounter Medications as of 04/14/2024  Medication Sig   magnesium hydroxide (MILK OF MAGNESIA) 400 MG/5ML suspension Take 45 mLs by mouth daily as needed for mild constipation.   acetaminophen  (TYLENOL ) 325 MG tablet Take 650 mg by mouth every 8 (eight) hours as needed.   alendronate (FOSAMAX) 70 MG tablet Take 70 mg by mouth once a week. Take with a full glass of water on an empty stomach. Friday  bimatoprost (LUMIGAN) 0.01 % SOLN Place 1 drop into both eyes at bedtime.   brimonidine (ALPHAGAN) 0.2 % ophthalmic solution Place 1 drop into the right eye 3 (three) times daily. Wait 10 minutes between drops. (Keep eyes closed for 2 minutes after each drop)   chlorhexidine (PERIDEX) 0.12 % solution Use as directed 5 mLs in the mouth or throat at bedtime. 1 tsp   Cholecalciferol (VITAMIN D3) 50 MCG (2000 UT) TABS Take 1 tablet by mouth daily.   cyanocobalamin 1000 MCG tablet Take 1,000 mcg by mouth in the morning.   docusate sodium (COLACE) 100 MG capsule Take 100 mg  by mouth daily.   donepezil (ARICEPT) 10 MG tablet Take 10 mg by mouth at bedtime.   dorzolamide-timolol (COSOPT) 22.3-6.8 MG/ML ophthalmic solution Place 1 drop into the right eye 2 (two) times daily. Wait 10 minutes between drops. (Keep eyes closed for 2 minutes after each drop)   Iron , Ferrous Sulfate , 325 (65 Fe) MG TABS Take 325 mg by mouth daily.   lamoTRIgine  (LAMICTAL ) 100 MG tablet Take 1 tablet in morning, 1 and 1/2 tablets at night   memantine (NAMENDA) 5 MG tablet Take 10 mg by mouth 2 (two) times daily.   Multiple Vitamin (MULTIVITAMIN) tablet Take 1 tablet by mouth daily.   pantoprazole  (PROTONIX ) 40 MG tablet Take 1 tablet (40 mg total) by mouth daily.   pilocarpine (PILOCAR) 4 % ophthalmic solution Place 1 drop into the right eye 4 (four) times daily. Wait 10 minutes between drops. (Keep eyes closed for 2 minutes after each drop)   polyethylene glycol (MIRALAX ) 17 g packet Take 17 g by mouth every other day.   RHOPRESSA 0.02 % SOLN Apply 1 drop to eye every evening.   No facility-administered encounter medications on file as of 04/14/2024.    Review of Systems  Unable to perform ROS: Dementia    Immunization History  Administered Date(s) Administered   Fluad Quad(high Dose 65+) 09/24/2023   Influenza, High Dose Seasonal PF 09/26/2015, 09/03/2017, 08/28/2018, 09/23/2020, 09/07/2022   Influenza,inj,Quad PF,6+ Mos 10/01/2019   Influenza-Unspecified 09/22/2009, 08/27/2011, 10/08/2012, 12/08/2014, 02/10/2018, 09/06/2021   Moderna Covid-19 Fall Seasonal Vaccine 78yrs & older 10/08/2022   Moderna Covid-19 Vaccine Bivalent Booster 14yrs & up 04/19/2022, 09/24/2023   Moderna Sars-Covid-2 Vaccination 12/14/2019, 01/12/2020, 09/20/2020, 10/13/2020, 10/08/2022   Pneumococcal Conjugate-13 12/31/2013   Pneumococcal Polysaccharide-23 12/04/1999, 02/02/2016   Td 12/04/2000   Tdap 11/07/2012, 01/18/2023   Zoster Recombinant(Shingrix) 04/01/2022, 06/01/2022   Pertinent  Health  Maintenance Due  Topic Date Due   INFLUENZA VACCINE  07/03/2024   DEXA SCAN  Completed      12/11/2022   11:10 AM 02/19/2023    2:40 PM 04/11/2023   11:32 AM 08/13/2023   12:36 PM 12/31/2023    1:36 PM  Fall Risk  Falls in the past year? 0 1 1 1 1   Was there an injury with Fall? 0 0 1 1 0  Fall Risk Category Calculator 0 1 2 2 2   Fall Risk Category (Retired) Low      (RETIRED) Patient Fall Risk Level Low fall risk      Patient at Risk for Falls Due to No Fall Risks History of fall(s);Impaired balance/gait;Impaired mobility History of fall(s) History of fall(s);Impaired balance/gait History of fall(s);Impaired balance/gait  Fall risk Follow up Falls evaluation completed Falls evaluation completed;Education provided;Falls prevention discussed Falls evaluation completed Falls evaluation completed;Education provided;Falls prevention discussed Falls evaluation completed;Education provided   Functional Status Survey:  Vitals:   04/14/24 1008  BP: 109/65  Pulse: 85  Temp: (!) 97.2 F (36.2 C)  SpO2: 97%  Weight: 132 lb (59.9 kg)  Height: 5\' 5"  (1.651 m)   Body mass index is 21.97 kg/m. Physical Exam Vitals reviewed.  Constitutional:      General: She is not in acute distress.    Appearance: She is not ill-appearing.  HENT:     Head: Normocephalic and atraumatic.  Eyes:     General:        Right eye: No discharge.        Left eye: No discharge.     Pupils: Pupils are equal, round, and reactive to light.  Cardiovascular:     Rate and Rhythm: Normal rate and regular rhythm.     Pulses: Normal pulses.     Heart sounds: Normal heart sounds.  Pulmonary:     Effort: Pulmonary effort is normal.     Breath sounds: Normal breath sounds.  Abdominal:     General: Bowel sounds are normal. There is no distension.     Palpations: Abdomen is soft.     Tenderness: There is no abdominal tenderness.  Musculoskeletal:     Cervical back: Neck supple.     Right lower leg: No edema.      Left lower leg: No edema.  Skin:    General: Skin is warm.     Capillary Refill: Capillary refill takes less than 2 seconds.  Neurological:     General: No focal deficit present.     Mental Status: She is alert and easily aroused. Mental status is at baseline.     Motor: Weakness present.     Gait: Gait abnormal.  Psychiatric:        Mood and Affect: Mood normal.     Comments: Alert to self/place, follows commands, able to answer simple questions      Labs reviewed: Recent Labs    10/29/23 0000 03/30/24 0000  NA 142 139  K 4.0 3.9  CL 107 105  CO2 24* 21  BUN 23* 10  CREATININE 0.9 0.9  CALCIUM 8.2* 8.4*   Recent Labs    10/29/23 0000 03/30/24 0000  AST 17 20  ALT 8 14  ALKPHOS 81 96  ALBUMIN 3.3* 3.7   Recent Labs    03/30/24 0000 04/02/24 0000  WBC 5.3 6.0  HGB 8.7* 8.2*  HCT 27* 26*  PLT 230 217   Lab Results  Component Value Date   TSH 1.36 03/30/2024   No results found for: "HGBA1C" Lab Results  Component Value Date   CHOL 241 (A) 09/10/2019   HDL 99 (A) 09/10/2019   LDLCALC 128 09/10/2019   TRIG 72 09/10/2019    Significant Diagnostic Results in last 30 days:  No results found.  Assessment/Plan 1. Unresponsive episode (Primary) - 05/13 during breakfast - episode < 1 minute> improved with sternal chest rub - vitals stable - blood sugar was 180 an hour later - ? Absence/ petit mal seizure - on Lamictal  for past seizure disorder - do not recommend aggressive workup  - cont neuro checks   2. Mixed Alzheimer's and vascular dementia (HCC) - resides on memory care - dependent with ADLs except feeding - having more falls - weight stable - cont Aricept  3. Seizure (HCC) - see above - cont Lamictal   4. Anemia, unspecified - recent drop in hgb from 8.7 to 8.2 - 05/02 hemoccult positive - 05/03 ferrous sulfate   and Protonix  started - blood pressures stable - repeat cbc/diff 05/04/2024    Family/ staff Communication: plan discussed  with patient and nurse  Labs/tests ordered:  cbc/diff 05/04/2024

## 2024-04-16 DIAGNOSIS — I639 Cerebral infarction, unspecified: Secondary | ICD-10-CM | POA: Diagnosis not present

## 2024-04-16 DIAGNOSIS — I110 Hypertensive heart disease with heart failure: Secondary | ICD-10-CM | POA: Diagnosis not present

## 2024-04-16 DIAGNOSIS — N1832 Chronic kidney disease, stage 3b: Secondary | ICD-10-CM | POA: Diagnosis not present

## 2024-04-16 DIAGNOSIS — I10 Essential (primary) hypertension: Secondary | ICD-10-CM | POA: Diagnosis not present

## 2024-04-16 LAB — CBC: RBC: 3.55 — AB (ref 3.87–5.11)

## 2024-04-16 LAB — CBC AND DIFFERENTIAL
HCT: 30 — AB (ref 36–46)
Hemoglobin: 9.7 — AB (ref 12.0–16.0)
Platelets: 222 10*3/uL (ref 150–400)
WBC: 6.5

## 2024-04-30 ENCOUNTER — Encounter: Payer: Self-pay | Admitting: Adult Health

## 2024-04-30 ENCOUNTER — Non-Acute Institutional Stay (SKILLED_NURSING_FACILITY): Payer: Self-pay | Admitting: Adult Health

## 2024-04-30 DIAGNOSIS — H401122 Primary open-angle glaucoma, left eye, moderate stage: Secondary | ICD-10-CM

## 2024-04-30 DIAGNOSIS — M810 Age-related osteoporosis without current pathological fracture: Secondary | ICD-10-CM

## 2024-04-30 DIAGNOSIS — D500 Iron deficiency anemia secondary to blood loss (chronic): Secondary | ICD-10-CM | POA: Diagnosis not present

## 2024-04-30 DIAGNOSIS — D472 Monoclonal gammopathy: Secondary | ICD-10-CM

## 2024-04-30 DIAGNOSIS — G309 Alzheimer's disease, unspecified: Secondary | ICD-10-CM | POA: Diagnosis not present

## 2024-04-30 DIAGNOSIS — R55 Syncope and collapse: Secondary | ICD-10-CM

## 2024-04-30 DIAGNOSIS — G63 Polyneuropathy in diseases classified elsewhere: Secondary | ICD-10-CM

## 2024-04-30 DIAGNOSIS — N1832 Chronic kidney disease, stage 3b: Secondary | ICD-10-CM

## 2024-04-30 DIAGNOSIS — F0150 Vascular dementia without behavioral disturbance: Secondary | ICD-10-CM

## 2024-04-30 DIAGNOSIS — F0280 Dementia in other diseases classified elsewhere without behavioral disturbance: Secondary | ICD-10-CM

## 2024-04-30 DIAGNOSIS — G40309 Generalized idiopathic epilepsy and epileptic syndromes, not intractable, without status epilepticus: Secondary | ICD-10-CM

## 2024-04-30 NOTE — Assessment & Plan Note (Addendum)
 Improved since starting iron  Continue protonix  Repeat due 05/04/24

## 2024-04-30 NOTE — Assessment & Plan Note (Signed)
 Several episodes in the past year vs seizure No bradycardia ?aricept causing s/e Will discuss with Dr Venice Gillis

## 2024-04-30 NOTE — Assessment & Plan Note (Signed)
 Continue Fosamax No further aggressive testing due to goals of care.

## 2024-04-30 NOTE — Assessment & Plan Note (Signed)
 Continue lamictal

## 2024-04-30 NOTE — Assessment & Plan Note (Signed)
Continue to periodically monitor BMP and avoid nephrotoxic agents  

## 2024-04-30 NOTE — Progress Notes (Signed)
 [facility] Room Number: 315-A Place of Service:  SNF [phone]) Provider:  Raylene Calamity, NP  PCP: Marguerite Shiley, MD  Patient Care Team: Marguerite Shiley, MD as PCP - General (Internal Medicine) Brian Campanile, MD (Inactive) as Consulting Physician (Neurology)  Extended Emergency Contact Information Primary Emergency Contact: [facility] Home Phone: [phone] Mobile Phone: [phone] Relation: Relative Secondary Emergency Contact: wright,Judit Mobile Phone: [phone] Relation: Daughter  Code Status:  DNR Goals of care: Advanced Directive information    04/30/2024   11:54 AM  Advanced Directives  Does Patient Have a Medical Advance Directive? Yes  Type of Estate agent of [place];Out of facility DNR (pink MOST or yellow form)  Does patient want to make changes to medical advance directive? No - Patient declined  Copy of Healthcare Power of Attorney in Chart? Yes - validated most recent copy scanned in chart (See row information)     Chief Complaint  Patient presents with   Medical Management of Chronic Issues    Routine visit. Discuss the need for AWV.    HPI:  Pt is a 89 y.o. female seen today for medical management of chronic diseases.    PMH significant for glaucoma, seizures, arthritis, HTN, low back pain, dementia, MGUS, h/x of parathyroidectomy, osteoporosis with hx of pelvic fracture, and peripheral neuropathy.   Resides in memory care due to progressive dementia. No acute behavioral concerns. Spends most of the day in her room.  Remains ambulatory. MMSE 18/30 05/2023  Unresponsive episode 04/14/24 less than one minute. Seizure vs syncope. BP was soft. Blood sugar WNl.   Anemia: hemoccult positive 04/03/24. On iron  and protonix . Hgb improving. POA declined aggressive work up due to age and pt wishes. 5/15 Hgb 9.7 5/1 8.2 Frequent falls  Followed by ophthalmology for glaucoma Hx  of seizures, no new events.  On fosamax for OP since 04/2021, T score -5.2 prior hx of wrist and pelvic fractures.  Prior hx of thyroid  nodules not worked up due to age/goals of care.  Hx of MGUS with neuropathy. No current symptoms. Walks with a walker Has some chronic knee pain due to OA unchanged.  Past Medical History:  Diagnosis Date   Arthritis    Per [facility] new patient packet    Epilepsy [facility])    Per [facility] new patient packet    Glaucoma    Hearing deficit    Hearing aids   High blood pressure    Per [facility] new patient packet    History of abnormal weight loss    Low back pain    Memory difficulty 05/09/2016   MGUS (monoclonal gammopathy of unknown significance)    Neuropathy associated with MGUS (HCC) 11/09/2015   Radiculopathy of sacral and sacrococcygeal region    S1   Seizures (HCC)    Past Surgical History:  Procedure Laterality Date   APPENDECTOMY     BACK SURGERY     CATARACT EXTRACTION Left    OVARY SURGERY     PARATHYROIDECTOMY  9/   TRABECULECTOMY Left     Allergies  Allergen Reactions   Hctz [Hydrochlorothiazide]    Other Other (See Comments)    Caused sodium to drop    Allergies as of 04/30/2024       Reactions   Hctz [hydrochlorothiazide]    Other Other (See Comments)   Caused sodium to drop        Medication List  Accurate as of Apr 30, 2024 12:02 PM. If you have any questions, ask your nurse or doctor.          acetaminophen  325 MG tablet Commonly known as: TYLENOL  Take 650 mg by mouth every 8 (eight) hours as needed.   alendronate 70 MG tablet Commonly known as: FOSAMAX Take 70 mg by mouth once a week. Take with a full glass of water on an empty stomach. Friday   bimatoprost 0.01 % Soln Commonly known as: LUMIGAN Place 1 drop into both eyes at bedtime.   brimonidine 0.2 % ophthalmic solution Commonly known as: ALPHAGAN Place 1 drop into the right eye 3 (three) times daily. Wait 10 minutes between drops. (Keep eyes closed for 2  minutes after each drop)   chlorhexidine 0.12 % solution Commonly known as: PERIDEX Use as directed 5 mLs in the mouth or throat at bedtime. 1 tsp   cyanocobalamin 1000 MCG tablet Take 1,000 mcg by mouth in the morning.   docusate sodium 100 MG capsule Commonly known as: COLACE Take 100 mg by mouth daily.   donepezil 10 MG tablet Commonly known as: ARICEPT Take 10 mg by mouth at bedtime.   dorzolamide-timolol 2-0.5 % ophthalmic solution Commonly known as: COSOPT Place 1 drop into the right eye 2 (two) times daily. Wait 10 minutes between drops. (Keep eyes closed for 2 minutes after each drop)   Iron  (Ferrous Sulfate ) 325 (65 Fe) MG Tabs Take 325 mg by mouth daily.   lamoTRIgine  100 MG tablet Commonly known as: LAMICTAL  Take 1 tablet in morning, 1 and 1/2 tablets at night   magnesium hydroxide 400 MG/5ML suspension Commonly known as: MILK OF MAGNESIA Take 45 mLs by mouth daily as needed for mild constipation.   memantine 5 MG tablet Commonly known as: NAMENDA Take 10 mg by mouth 2 (two) times daily.   multivitamin tablet Take 1 tablet by mouth daily.   pantoprazole  40 MG tablet Commonly known as: PROTONIX  Take 1 tablet (40 mg total) by mouth daily.   pilocarpine 4 % ophthalmic solution Commonly known as: PILOCAR Place 1 drop into the right eye 4 (four) times daily. Wait 10 minutes between drops. (Keep eyes closed for 2 minutes after each drop)   polyethylene glycol 17 g packet Commonly known as: MiraLax  Take 17 g by mouth every other day.   Rhopressa 0.02 % Soln Generic drug: Netarsudil Dimesylate Apply 1 drop to eye every evening.   Vitamin D3 50 MCG (2000 UT) Tabs Take 1 tablet by mouth daily.        Review of Systems  Constitutional:  Negative for activity change, appetite change, chills, diaphoresis, fatigue, fever and unexpected weight change.  HENT:  Negative for congestion.   Respiratory:  Negative for cough, shortness of breath and wheezing.    Cardiovascular:  Negative for chest pain, palpitations and leg swelling.  Gastrointestinal:  Negative for abdominal distention, abdominal pain, constipation and diarrhea.  Genitourinary:  Negative for difficulty urinating and dysuria.  Musculoskeletal:  Positive for arthralgias and gait problem. Negative for back pain, joint swelling and myalgias.  Neurological:  Positive for syncope. Negative for dizziness, tremors, seizures, facial asymmetry, speech difficulty, weakness, light-headedness, numbness and headaches.  Psychiatric/Behavioral:  Positive for confusion. Negative for agitation and behavioral problems.     Immunization History  Administered Date(s) Administered   Fluad Quad(high Dose 65+) 09/24/2023   Influenza, High Dose Seasonal PF 09/26/2015, 09/03/2017, 08/28/2018, 09/23/2020, 09/07/2022   Influenza,inj,Quad PF,6+ Mos 10/01/2019  Influenza-Unspecified 09/22/2009, 08/27/2011, 10/08/2012, 12/08/2014, 02/10/2018, 09/06/2021   Moderna Covid-19 Fall Seasonal Vaccine 102yrs & older 10/08/2022   Moderna Covid-19 Vaccine Bivalent Booster 44yrs & up 04/19/2022, 09/24/2023   Moderna Sars-Covid-2 Vaccination 12/14/2019, 01/12/2020, 09/20/2020, 10/13/2020, 10/08/2022   Pneumococcal Conjugate-13 12/31/2013   Pneumococcal Polysaccharide-23 12/04/1999, 02/02/2016   Td 12/04/2000   Tdap 11/07/2012, 01/18/2023   Zoster Recombinant(Shingrix) 04/01/2022, 06/01/2022   Pertinent  Health Maintenance Due  Topic Date Due   INFLUENZA VACCINE  07/03/2024   DEXA SCAN  Completed      02/19/2023    2:40 PM 04/11/2023   11:32 AM 08/13/2023   12:36 PM 12/31/2023    1:36 PM 04/14/2024    2:54 PM  Fall Risk  Falls in the past year? 1 1 1 1 1   Was there an injury with Fall? 0 1 1 0 1  Fall Risk Category Calculator 1 2 2 2 3   Patient at Risk for Falls Due to History of fall(s);Impaired balance/gait;Impaired mobility History of fall(s) History of fall(s);Impaired balance/gait History of fall(s);Impaired  balance/gait History of fall(s);Impaired balance/gait  Fall risk Follow up Falls evaluation completed;Education provided;Falls prevention discussed Falls evaluation completed Falls evaluation completed;Education provided;Falls prevention discussed Falls evaluation completed;Education provided Falls evaluation completed   Functional Status Survey:    Vitals:   04/30/24 1149  BP: (!) 151/74  Pulse: 75  Resp: 18  Temp: (!) 96.8 F (36 C)  SpO2: 98%  Weight: 132 lb (59.9 kg)  Height: 5\' 5"  (1.651 m)   Body mass index is 21.97 kg/m. Physical Exam Vitals and nursing note reviewed.  Constitutional:      General: She is not in acute distress.    Appearance: She is not diaphoretic.  HENT:     Head: Normocephalic and atraumatic.     Mouth/Throat:     Mouth: Mucous membranes are moist.     Pharynx: Oropharynx is clear.  Eyes:     General:        Right eye: No discharge.        Left eye: No discharge.     Conjunctiva/sclera: Conjunctivae normal.     Comments: Right sclera with erythema Left sclera normal Right pupil less reactive than left which is more brisk  Neck:     Vascular: No JVD.  Cardiovascular:     Rate and Rhythm: Normal rate and regular rhythm.     Heart sounds: No murmur heard. Pulmonary:     Effort: Pulmonary effort is normal. No respiratory distress.     Breath sounds: Normal breath sounds. No wheezing.  Abdominal:     General: Abdomen is flat. Bowel sounds are normal. There is no distension.     Palpations: Abdomen is soft.     Tenderness: There is no abdominal tenderness.  Musculoskeletal:     Comments: Trace edema BLE ptting  Skin:    General: Skin is warm and dry.  Neurological:     Mental Status: She is alert and oriented to person, place, and time.     Labs reviewed: Recent Labs    10/29/23 0000 03/30/24 0000  NA 142 139  K 4.0 3.9  CL 107 105  CO2 24* 21  BUN 23* 10  CREATININE 0.9 0.9  CALCIUM 8.2* 8.4*   Recent Labs    10/29/23 0000  03/30/24 0000  AST 17 20  ALT 8 14  ALKPHOS 81 96  ALBUMIN 3.3* 3.7   Recent Labs    03/30/24 0000 04/02/24 0000  04/16/24 0000  WBC 5.3 6.0  6.0 6.5  HGB 8.7* 8.2*  8.2* 9.7*  HCT 27* 26*  26* 30*  PLT 230 217  217 222   Lab Results  Component Value Date   TSH 1.36 03/30/2024   No results found for: "HGBA1C" Lab Results  Component Value Date   CHOL 241 (A) 09/10/2019   HDL 99 (A) 09/10/2019   LDLCALC 128 09/10/2019   TRIG 72 09/10/2019    Significant Diagnostic Results in last 30 days:  No results found.  Assessment/Plan  Anemia due to chronic blood loss Improved since starting iron  Continue protonix  Repeat due 05/04/24  Stage 3b chronic kidney disease (HCC) Continue to periodically monitor BMP and avoid nephrotoxic agents   Primary open angle glaucoma of left eye, moderate stage Followed by ophthalmology last seen  03/30/24  Senile osteoporosis Continue Fosamax No further aggressive testing due to goals of care.   Neuropathy associated with MGUS (HCC) Fall risk Walker or WC for all mobility   Generalized convulsive epilepsy without intractable epilepsy (HCC) Continue lamictal   Syncope Several episodes in the past year vs seizure No bradycardia ?aricept causing s/e Will discuss with Dr Venice Gillis  Mixed Alzheimer's and vascular dementia [facility]) Progressive decline in cognition and physical function c/w the disease. Continue supportive care in the skilled environment. On aricept and namenda   Family/ staff Communication:   Labs/tests ordered:

## 2024-04-30 NOTE — Assessment & Plan Note (Signed)
 Fall risk Walker or WC for all mobility

## 2024-04-30 NOTE — Assessment & Plan Note (Signed)
 Progressive decline in cognition and physical function c/w the disease. Continue supportive care in the skilled environment. On aricept and namenda

## 2024-04-30 NOTE — Assessment & Plan Note (Signed)
 Followed by ophthalmology last seen  03/30/24

## 2024-05-08 ENCOUNTER — Encounter: Payer: Self-pay | Admitting: Adult Health

## 2024-05-08 ENCOUNTER — Non-Acute Institutional Stay (SKILLED_NURSING_FACILITY): Payer: Self-pay | Admitting: Adult Health

## 2024-05-08 DIAGNOSIS — Z0000 Encounter for general adult medical examination without abnormal findings: Secondary | ICD-10-CM

## 2024-05-08 NOTE — Progress Notes (Signed)
 Subjective:   Tracy Nielsen is a 89 y.o. female who presents for Medicare Annual (Subsequent) preventive examination at [facility]  Visit Complete: In person  Patient Medicare AWV questionnaire was completed by the patient on 05/08/24; I have confirmed that all information answered by patient is correct and no changes since this date.  Cardiac Risk Factors include: advanced age (>40men, >37 women)     Objective:     Today's Vitals   05/08/24 0940  BP: (!) 151/74  Pulse: 75  Resp: 18  Temp: (!) 96.8 F (36 C)  SpO2: 98%  Weight: 131 lb 12.8 oz (59.8 kg)  Height: 5\' 5"  (1.651 m)   Body mass index is 21.93 kg/m.     05/08/2024   10:32 AM 04/30/2024   11:54 AM 02/19/2024    4:13 PM 12/05/2023    9:04 AM 11/05/2023    4:53 PM 10/28/2023   11:32 AM 07/09/2023    4:59 PM  Advanced Directives  Does Patient Have a Medical Advance Directive? Yes Yes Yes Yes Yes Yes Yes  Type of Estate agent of [place];Living will;Out of facility DNR (pink MOST or yellow form) Healthcare Power of [place];Out of facility DNR (pink MOST or yellow form) Healthcare Power of [place];Out of facility DNR (pink MOST or yellow form) Healthcare Power of [place];Out of facility DNR (pink MOST or yellow form) Healthcare Power of [place];Out of facility DNR (pink MOST or yellow form) Healthcare Power of [place];Out of facility DNR (pink MOST or yellow form) Healthcare Power of [place];Out of facility DNR (pink MOST or yellow form)  Does patient want to make changes to medical advance directive? No - Patient declined No - Patient declined No - Patient declined No - Patient declined No - Patient declined No - Patient declined No - Patient declined  Copy of Healthcare Power of Attorney in Chart?  Yes - validated most recent copy scanned in chart (See row information) Yes - validated most recent copy scanned in chart (See row information) Yes - validated most  recent copy scanned in chart (See row information) Yes - validated most recent copy scanned in chart (See row information) Yes - validated most recent copy scanned in chart (See row information) Yes - validated most recent copy scanned in chart (See row information)  Pre-existing out of facility DNR order (yellow form or pink MOST form) Pink MOST form placed in chart (order not valid for inpatient use);Pink MOST/Yellow Form most recent copy in chart - Physician notified to receive inpatient order   Pink MOST form placed in chart (order not valid for inpatient use) Pink MOST form placed in chart (order not valid for inpatient use) Yellow form placed in chart (order not valid for inpatient use) Yellow form placed in chart (order not valid for inpatient use)    Current Medications (verified) Outpatient Encounter Medications as of 05/08/2024  Medication Sig   acetaminophen  (TYLENOL ) 325 MG tablet Take 650 mg by mouth every 8 (eight) hours as needed.   alendronate (FOSAMAX) 70 MG tablet Take 70 mg by mouth once a week. Take with a full glass of water on an empty stomach. Friday   bimatoprost (LUMIGAN) 0.01 % SOLN Place 1 drop into both eyes at bedtime.   brimonidine (ALPHAGAN) 0.2 % ophthalmic solution Place 1 drop into the right eye 3 (three) times daily. Wait 10 minutes between drops. (Keep eyes closed for 2 minutes after each drop)   chlorhexidine (PERIDEX) 0.12 %  solution Use as directed 5 mLs in the mouth or throat at bedtime. 1 tsp   Cholecalciferol (VITAMIN D3) 50 MCG (2000 UT) TABS Take 1 tablet by mouth daily.   cyanocobalamin 1000 MCG tablet Take 1,000 mcg by mouth in the morning.   docusate sodium (COLACE) 100 MG capsule Take 100 mg by mouth daily.   donepezil (ARICEPT) 10 MG tablet Take 10 mg by mouth at bedtime.   dorzolamide-timolol (COSOPT) 22.3-6.8 MG/ML ophthalmic solution Place 1 drop into the right eye 2 (two) times daily. Wait 10 minutes between drops. (Keep eyes closed for 2 minutes  after each drop)   Iron , Ferrous Sulfate , 325 (65 Fe) MG TABS Take 325 mg by mouth daily.   lamoTRIgine  (LAMICTAL ) 100 MG tablet Take 1 tablet in morning, 1 and 1/2 tablets at night   memantine (NAMENDA) 5 MG tablet Take 10 mg by mouth 2 (two) times daily.   Multiple Vitamin (MULTIVITAMIN) tablet Take 1 tablet by mouth daily.   pantoprazole  (PROTONIX ) 40 MG tablet Take 1 tablet (40 mg total) by mouth daily.   pilocarpine (PILOCAR) 4 % ophthalmic solution Place 1 drop into the right eye 4 (four) times daily. Wait 10 minutes between drops. (Keep eyes closed for 2 minutes after each drop)   polyethylene glycol (MIRALAX ) 17 g packet Take 17 g by mouth every other day.   RHOPRESSA 0.02 % SOLN Apply 1 drop to eye every evening.   [DISCONTINUED] magnesium hydroxide (MILK OF MAGNESIA) 400 MG/5ML suspension Take 45 mLs by mouth daily as needed for mild constipation. (Patient not taking: Reported on 05/08/2024)   No facility-administered encounter medications on file as of 05/08/2024.    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   Radiculopathy of sacral and sacrococcygeal region    S1   Seizures (HCC)    Past Surgical History:  Procedure Laterality Date   APPENDECTOMY     BACK SURGERY     CATARACT EXTRACTION Left    OVARY SURGERY     PARATHYROIDECTOMY  9/   TRABECULECTOMY Left    Family History  Problem Relation Age of Onset   Congestive Heart Failure Mother    Parkinsonism Father    Heart attack Father    Colon cancer Sister    Multiple  sclerosis Daughter    Muscular dystrophy Grandchild    Muscular dystrophy Grandchild    Social History   Socioeconomic History   Marital status: Widowed    Spouse name: Not on file   Number of children: 4   Years of education: 14   Highest education level: Not on file  Occupational History   Occupation: Retired  Tobacco Use   Smoking status: Former    Current packs/day: 0.00    Average packs/day: 0.5 packs/day for 25.0 years (12.5 ttl pk-yrs)    Types: Cigarettes    Start date: 12/03/1948    Quit date: 12/03/1973    Years  since quitting: 50.4   Smokeless tobacco: Never  Vaping Use   Vaping status: Never Used  Substance and Sexual Activity   Alcohol use: Yes    Comment: Consumes 2 glasses of wine per day   Drug use: No   Sexual activity: Not Currently  Other Topics Concern   Not on file  Social History Narrative   Lives in memory care      Patient is right handed.       Per [facility] New Patient Packet, Abstracted 08/26/2019   Diet:Normal      Caffeine:Yes      Married, if yes what year: Widow 59 (div 71), remarried 1988      Do you live in a house, apartment, assisted living, condo, trailer, ect: memory care      Pets: No      Current/Past profession: Nature conservation officer, organize affairs for elderly      Exercise: no      Living Will: Yes   DNR: No, would like to discuss one--did discuss on 10/9 and DNR form completed, copy made for vynca, and original placed in paper chart; HCPOA, [name], also present and agreeable with his mother-in-law decision      POA/HPOA: Yes      Functional Status:   Do you have difficulty bathing or dressing yourself? yes   Do you have difficulty preparing food or eating? Prep yes, eating yes   Do you have difficulty managing your medications? yes   Do you have difficulty managing your finances? yess   Do you have difficulty affording your medications?  no   Social Drivers of Corporate investment banker Strain: Not  on file  Food Insecurity: No Food Insecurity (04/11/2023)   Hunger Vital Sign    Worried About Running Out of Food in the Last Year: Never true    Ran Out of Food in the Last Year: Never true  Transportation Needs: Not on file  Physical Activity: Not on file  Stress: Not on file  Social Connections: Not on file    Tobacco Counseling Counseling given: Not Answered   Clinical Intake:  Pre-visit preparation completed: No  Pain : No/denies pain     BMI - recorded: 21.93 Nutritional Status: BMI of 19-24  Normal Nutritional Risks: None Diabetes: No  How often do you need to have someone help you when you read instructions, pamphlets, or other written materials from your doctor or pharmacy?: 3 - Sometimes  Interpreter Needed?: No  Information entered by :: Craige Dixon Francessca Friis NP   Activities of Daily Living    05/08/2024   10:20 AM  In your present state of health, do you have any difficulty performing the following activities:  Hearing? 1  Vision? 1  Difficulty concentrating or making decisions? 1  Walking or climbing stairs? 1  Dressing or bathing? 1  Doing errands, shopping? 1  Preparing Food and eating ? Y  Using the Toilet? Y  In the past six months, have you accidently leaked urine? Y  Do you have problems with loss of bowel control? N  Managing your Medications? Y  Managing your Finances? Y  Housekeeping or managing your Housekeeping? Y    Patient Care Team: Marguerite Shiley, MD as PCP - General (Internal Medicine) Brian Campanile, MD (Inactive) as Consulting Physician (Neurology)  Indicate any recent Medical Services you may have received from other than [facility] providers in the past year (date may be approximate).  Assessment:    This is a routine wellness examination for [place].  Hearing/Vision screen No results found.   Goals Addressed             This Visit's Progress    Optimal Cognitive Function       Evidence-based guidance:  Assess  cognitive function using a standardized tool every 6 months or as condition changes.  Perform medication review to identify those that may impact cognitive function such as anticholinergic, opiate, benzodiazepine, digoxin, tricyclic antidepressant, skeletal muscle relaxant or antiepileptic medication.  Consider presence of hypotension or hypoglycemia because of intensive treatment for hypertension and diabetes as contributors to cognitive decline.  Screen for depression using a standardized tool such as the Geriatric Depression Scale.  Optimize visual and auditory function; refer for eye and hearing exam and advocate for treatment that may include eyeglasses, hearing aid or cataract surgery; refer for or provide occupational therapy evaluation.  Provide or refer for individual or group cognitive stimulation therapy, reminiscence therapy, cognitive training or rehabilitation, which may provide some benefit with memory loss and cognition in early stages of dementia.  Encourage social relationships and physical activity based on ability.  Promote strategies that will preserve the individual's abilities; focus on strengths and quality of life optimization.  Prepare patient and caregiver for introduction of pharmacologic therapy that may include cholinesterase inhibitor or NMDA (N-methyl-d-aspartate) receptor antagonist.  Monitor for and address medication side effects such as anorexia, weight loss, syncope, bradycardia, falls and fractures.   Notes:        Depression Screen    05/08/2024   10:19 AM 04/14/2024    2:54 PM 12/31/2023    1:37 PM 08/13/2023   12:36 PM 04/11/2023   11:33 AM 02/19/2023    2:41 PM 12/11/2022   11:10 AM  PHQ 2/9 Scores  PHQ - 2 Score 0    0 0 0  Exception Documentation  Medical reason Medical reason Medical reason       Fall Risk    05/08/2024   10:18 AM 04/14/2024    2:54 PM 12/31/2023    1:36 PM 08/13/2023   12:36 PM 04/11/2023   11:32 AM  Fall Risk   Falls in the past  year? 1 1 1 1 1   Number falls in past yr: 1 1 1  0 0  Injury with Fall? 1 1 0 1 1  Risk for fall due to : History of fall(s);Impaired balance/gait History of fall(s);Impaired balance/gait History of fall(s);Impaired balance/gait History of fall(s);Impaired balance/gait History of fall(s)  Follow up Falls evaluation completed Falls evaluation completed Falls evaluation completed;Education provided Falls evaluation completed;Education provided;Falls prevention discussed Falls evaluation completed    MEDICARE RISK AT HOME: Medicare Risk at Home Any stairs in or around the home?: No If so, are there any without handrails?: No Home free of loose throw rugs in walkways, pet beds, electrical cords, etc?: Yes Adequate lighting in your home to reduce risk of falls?: Yes Life alert?: No Use of a cane, walker or w/c?: Yes Grab bars in the bathroom?: Yes Shower chair or bench in shower?: Yes Elevated toilet seat or a handicapped toilet?: Yes  TIMED UP AND GO:  Was the test performed?  No    Cognitive Function:    03/30/2022    9:37 AM 02/07/2022   11:16 AM 12/01/2019    9:58 AM 01/14/2018    9:30 AM 01/09/2017    9:40 AM  MMSE - Mini Mental State Exam  Orientation to  time 1 4 2 4 4   Orientation to Place 5 4 5 5 5   Registration 3 3 3 3 3   Attention/ Calculation 0 5 5 5 5   Recall 0 1 0 1 1  Language- name 2 objects 2 2 2 2 2   Language- repeat 1 1 1 1 1   Language- follow 3 step command 3 3 3 3 3   Language- read & follow direction 1 1 1 1 1   Write a sentence 1 1 1 1 1   Copy design 1 1 1 1 1   Total score 18 26 24 27 27         05/08/2024   10:21 AM 04/11/2023   11:28 AM 02/10/2021   11:02 AM  6CIT Screen  What Year? 4 points 4 points 0 points  What month? 3 points 3 points 0 points  What time? 3 points 3 points 0 points  Count back from 20 4 points 2 points 0 points  Months in reverse 4 points 4 points 4 points  Repeat phrase 10 points 10 points 10 points  Total Score 28 points 26 points  14 points    Immunizations Immunization History  Administered Date(s) Administered   Fluad Quad(high Dose 65+) 09/24/2023   Influenza, High Dose Seasonal PF 09/26/2015, 09/03/2017, 08/28/2018, 09/23/2020, 09/07/2022   Influenza,inj,Quad PF,6+ Mos 10/01/2019   Influenza-Unspecified 09/22/2009, 08/27/2011, 10/08/2012, 12/08/2014, 02/10/2018, 09/06/2021   Moderna Covid-19 Fall Seasonal Vaccine 5yrs & older 10/08/2022   Moderna Covid-19 Vaccine Bivalent Booster 107yrs & up 04/19/2022, 09/24/2023   Moderna Sars-Covid-2 Vaccination 12/14/2019, 01/12/2020, 09/20/2020, 10/13/2020, 10/08/2022   Pneumococcal Conjugate-13 12/31/2013   Pneumococcal Polysaccharide-23 12/04/1999, 02/02/2016   Td 12/04/2000   Tdap 11/07/2012, 01/18/2023   Zoster Recombinant(Shingrix) 04/01/2022, 06/01/2022    TDAP status: Up to date  Flu Vaccine status: Up to date  Pneumococcal vaccine status: Due, Education has been provided regarding the importance of this vaccine. Advised may receive this vaccine at local pharmacy or Health Dept. Aware to provide a copy of the vaccination record if obtained from local pharmacy or Health Dept. Verbalized acceptance and understanding.  Covid-19 vaccine status: Completed vaccines  Qualifies for Shingles Vaccine? Yes   Zostavax completed Yes   Shingrix Completed?: Yes  Screening Tests Health Maintenance  Topic Date Due   COVID-19 Vaccine (8 - 2024-25 season) 10/01/2024 (Originally 11/19/2023)   INFLUENZA VACCINE  07/03/2024   Medicare Annual Wellness (AWV)  05/08/2025   DTaP/Tdap/Td (4 - Td or Tdap) 01/18/2033   Pneumonia Vaccine 49+ Years old  Completed   DEXA SCAN  Completed   Zoster Vaccines- Shingrix  Completed   HPV VACCINES  Aged Out   Meningococcal B Vaccine  Aged Out    Health Maintenance  There are no preventive care reminders to display for this patient.   Colorectal cancer screening: No longer required.   Mammogram status: No longer required due to  age.  Bone Density status: Completed 11/29/20. Results reflect: Bone density results: OSTEOPOROSIS. Repeat every 2 years.  Lung Cancer Screening: (Low Dose CT Chest recommended if Age 92-80 years, 20 pack-year currently smoking OR have quit w/in 15years.) does not qualify.   Lung Cancer Screening Referral: NA  Additional Screening:  Hepatitis C Screening: does not qualify; Completed NA Is the patient up to date with their annual eye exam?  Yes  Who is the provider or what is the name of the office in which the patient attends annual eye exams? [facility] If pt is not established with a provider,  would they like to be referred to a provider to establish care? No .   Dental Screening: Recommended annual dental exams for proper oral hygiene  Diabetic Foot Exam: NA  Community Resource Referral / Chronic Care Management: CRR required this visit?  No   CCM required this visit?  No     Plan:     I have personally reviewed and noted the following in the patient's chart:   Medical and social history Use of alcohol, tobacco or illicit drugs  Current medications and supplements including opioid prescriptions. Patient is not currently taking opioid prescriptions. Functional ability and status Nutritional status Physical activity Advanced directives List of other physicians Hospitalizations, surgeries, and ER visits in previous 12 months Vitals Screenings to include cognitive, depression, and falls Referrals and appointments  In addition, I have reviewed and discussed with patient certain preventive protocols, quality metrics, and best practice recommendations. A written personalized care plan for preventive services as well as general preventive health recommendations were provided to patient.     Raylene Calamity, NP   05/08/2024   After Visit Summary: [facility] to [facility]  Nurse Notes: NA

## 2024-05-08 NOTE — Patient Instructions (Signed)
 Tracy Nielsen , Thank you for taking time to come for your Medicare Wellness Visit. I appreciate your ongoing commitment to your health goals. Please review the following plan we discussed and let me know if I can assist you in the future.   Screening recommendations/referrals: Colonoscopy aged out Mammogram aged out Bone Density goals of care are comfort based no longer receiving Recommended yearly ophthalmology/optometry visit for glaucoma screening and checkup Recommended yearly dental visit for hygiene and checkup  Vaccinations: Influenza vaccine- due annually in September/October Pneumococcal vaccine recommended and ordered Tdap vaccine up to date Shingles vaccine up to date    Advanced directives: reviewed   Conditions/risks identified: fall risk   Next appointment: 1 year   Preventive Care 63 Years and Older, Female Preventive care refers to lifestyle choices and visits with your health care provider that can promote health and wellness. What does preventive care include? A yearly physical exam. This is also called an annual well check. Dental exams once or twice a year. Routine eye exams. Ask your health care provider how often you should have your eyes checked. Personal lifestyle choices, including: Daily care of your teeth and gums. Regular physical activity. Eating a healthy diet. Avoiding tobacco and drug use. Limiting alcohol use. Practicing safe sex. Taking low-dose aspirin every day. Taking vitamin and mineral supplements as recommended by your health care provider. What happens during an annual well check? The services and screenings done by your health care provider during your annual well check will depend on your age, overall health, lifestyle risk factors, and family history of disease. Counseling  Your health care provider may ask you questions about your: Alcohol use. Tobacco use. Drug use. Emotional well-being. Home and relationship  well-being. Sexual activity. Eating habits. History of falls. Memory and ability to understand (cognition). Work and work Astronomer. Reproductive health. Screening  You may have the following tests or measurements: Height, weight, and BMI. Blood pressure. Lipid and cholesterol levels. These may be checked every 5 years, or more frequently if you are over 40 years old. Skin check. Lung cancer screening. You may have this screening every year starting at age 88 if you have a 30-pack-year history of smoking and currently smoke or have quit within the past 15 years. Fecal occult blood test (FOBT) of the stool. You may have this test every year starting at age 88. Flexible sigmoidoscopy or colonoscopy. You may have a sigmoidoscopy every 5 years or a colonoscopy every 10 years starting at age 88. Hepatitis C blood test. Hepatitis B blood test. Sexually transmitted disease (STD) testing. Diabetes screening. This is done by checking your blood sugar (glucose) after you have not eaten for a while (fasting). You may have this done every 1-3 years. Bone density scan. This is done to screen for osteoporosis. You may have this done starting at age 88. Mammogram. This may be done every 1-2 years. Talk to your health care provider about how often you should have regular mammograms. Talk with your health care provider about your test results, treatment options, and if necessary, the need for more tests. Vaccines  Your health care provider may recommend certain vaccines, such as: Influenza vaccine. This is recommended every year. Tetanus, diphtheria, and acellular pertussis (Tdap, Td) vaccine. You may need a Td booster every 10 years. Zoster vaccine. You may need this after age 27. Pneumococcal 13-valent conjugate (PCV13) vaccine. One dose is recommended after age 88. Pneumococcal polysaccharide (PPSV23) vaccine. One dose is recommended after age 88.  Talk to your health care provider about which  screenings and vaccines you need and how often you need them. This information is not intended to replace advice given to you by your health care provider. Make sure you discuss any questions you have with your health care provider. Document Released: 12/16/2015 Document Revised: 08/08/2016 Document Reviewed: 09/20/2015 Elsevier Interactive Patient Education  2017 [employer].  Fall Prevention in the Home Falls can cause injuries. They can happen to people of all ages. There are many things you can do to make your home safe and to help prevent falls. What can I do on the outside of my home? Regularly fix the edges of walkways and driveways and fix any cracks. Remove anything that might make you trip as you walk through a door, such as a raised step or threshold. Trim any bushes or trees on the path to your home. Use bright outdoor lighting. Clear any walking paths of anything that might make someone trip, such as rocks or tools. Regularly check to see if handrails are loose or broken. Make sure that both sides of any steps have handrails. Any raised decks and porches should have guardrails on the edges. Have any leaves, snow, or ice cleared regularly. Use sand or salt on walking paths during winter. Clean up any spills in your garage right away. This includes oil or grease spills. What can I do in the bathroom? Use night lights. Install grab bars by the toilet and in the tub and shower. Do not use towel bars as grab bars. Use non-skid mats or decals in the tub or shower. If you need to sit down in the shower, use a plastic, non-slip stool. Keep the floor dry. Clean up any water that spills on the floor as soon as it happens. Remove soap buildup in the tub or shower regularly. Attach bath mats securely with double-sided non-slip rug tape. Do not have throw rugs and other things on the floor that can make you trip. What can I do in the bedroom? Use night lights. Make sure that you have a  light by your bed that is easy to reach. Do not use any sheets or blankets that are too big for your bed. They should not hang down onto the floor. Have a firm chair that has side arms. You can use this for support while you get dressed. Do not have throw rugs and other things on the floor that can make you trip. What can I do in the kitchen? Clean up any spills right away. Avoid walking on wet floors. Keep items that you use a lot in easy-to-reach places. If you need to reach something above you, use a strong step stool that has a grab bar. Keep electrical cords out of the way. Do not use floor polish or wax that makes floors slippery. If you must use wax, use non-skid floor wax. Do not have throw rugs and other things on the floor that can make you trip. What can I do with my stairs? Do not leave any items on the stairs. Make sure that there are handrails on both sides of the stairs and use them. Fix handrails that are broken or loose. Make sure that handrails are as long as the stairways. Check any carpeting to make sure that it is firmly attached to the stairs. Fix any carpet that is loose or worn. Avoid having throw rugs at the top or bottom of the stairs. If you do have throw  rugs, attach them to the floor with carpet tape. Make sure that you have a light switch at the top of the stairs and the bottom of the stairs. If you do not have them, ask someone to add them for you. What else can I do to help prevent falls? Wear shoes that: Do not have high heels. Have rubber bottoms. Are comfortable and fit you well. Are closed at the toe. Do not wear sandals. If you use a stepladder: Make sure that it is fully opened. Do not climb a closed stepladder. Make sure that both sides of the stepladder are locked into place. Ask someone to hold it for you, if possible. Clearly mark and make sure that you can see: Any grab bars or handrails. First and last steps. Where the edge of each step  is. Use tools that help you move around (mobility aids) if they are needed. These include: Canes. Walkers. Scooters. Crutches. Turn on the lights when you go into a dark area. Replace any light bulbs as soon as they burn out. Set up your furniture so you have a clear path. Avoid moving your furniture around. If any of your floors are uneven, fix them. If there are any pets around you, be aware of where they are. Review your medicines with your doctor. Some medicines can make you feel dizzy. This can increase your chance of falling. Ask your doctor what other things that you can do to help prevent falls. This information is not intended to replace advice given to you by your health care provider. Make sure you discuss any questions you have with your health care provider. Document Released: 09/15/2009 Document Revised: 04/26/2016 Document Reviewed: 12/24/2014 Elsevier Interactive Patient Education  2017 [employer].

## 2024-06-15 ENCOUNTER — Non-Acute Institutional Stay (SKILLED_NURSING_FACILITY): Payer: Self-pay | Admitting: Internal Medicine

## 2024-06-15 ENCOUNTER — Encounter: Payer: Self-pay | Admitting: Internal Medicine

## 2024-06-15 DIAGNOSIS — R4182 Altered mental status, unspecified: Secondary | ICD-10-CM | POA: Diagnosis not present

## 2024-06-15 DIAGNOSIS — R569 Unspecified convulsions: Secondary | ICD-10-CM | POA: Diagnosis not present

## 2024-06-15 DIAGNOSIS — D500 Iron deficiency anemia secondary to blood loss (chronic): Secondary | ICD-10-CM | POA: Diagnosis not present

## 2024-06-15 DIAGNOSIS — G309 Alzheimer's disease, unspecified: Secondary | ICD-10-CM | POA: Diagnosis not present

## 2024-06-15 DIAGNOSIS — F0280 Dementia in other diseases classified elsewhere without behavioral disturbance: Secondary | ICD-10-CM

## 2024-06-15 DIAGNOSIS — F0150 Vascular dementia without behavioral disturbance: Secondary | ICD-10-CM

## 2024-06-15 DIAGNOSIS — N1832 Chronic kidney disease, stage 3b: Secondary | ICD-10-CM

## 2024-06-15 NOTE — Progress Notes (Signed)
 Location:  [facility] Room Number: 315 P Place of Service:  SNF [phone]) Provider:  Charlanne Fredia CROME, MD   Charlanne Fredia CROME, MD  Patient Care Team: Charlanne Fredia CROME, MD as PCP - General (Internal Medicine) Jenel Carlin POUR, MD (Inactive) as Consulting Physician (Neurology)  Extended Emergency Contact Information Primary Emergency Contact: [facility] Home Phone: [phone] Mobile Phone: [phone] Relation: Relative Secondary Emergency Contact: wright,Cheree Mobile Phone: [phone] Relation: Daughter  Code Status:  DNR Goals of care: Advanced Directive information    05/08/2024   10:32 AM  Advanced Directives  Does Patient Have a Medical Advance Directive? Yes  Type of Estate agent of [place];Living will;Out of facility DNR (pink MOST or yellow form)  Does patient want to make changes to medical advance directive? No - Patient declined  Pre-existing out of facility DNR order (yellow form or pink MOST form) Pink MOST form placed in chart (order not valid for inpatient use);Pink MOST/Yellow Form most recent copy in chart - Physician notified to receive inpatient order     Chief Complaint  Patient presents with   Routine Visit    HPI:  Pt is a 89 y.o. female seen today for medical management of chronic diseases.   Lives in Memory unit in [place]   Has h/o Alzheimer Dementia, h/o Seizure disorder and Osteoarthritis and Hypertension H/o Peripheral Neuropathy  Patient  Noticed to have symptoms of inability to speak on 06/13/2024 with elevated BP worsened over 24 hours with unable to talk and swallow Patient Unresponsive on 7/13 Son did not want her to go to ED as she is comfort care Since then patient has been unresponsive Some moaning mostly not responding She was not responding to me wither  Does move her extremeties when I put pressure  Past Medical History:  Diagnosis Date   Arthritis    Per [facility] new  patient packet    Epilepsy (HCC)    Per [facility] new patient packet    Glaucoma    Hearing deficit    Hearing aids   High blood pressure    Per [facility] new patient packet    History of abnormal weight loss    Low back pain    Memory difficulty 05/09/2016   MGUS (monoclonal gammopathy of unknown significance)    Neuropathy associated with 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   alendronate (FOSAMAX) 70 MG tablet Take 70 mg by mouth once a week. Take with a full glass of water on an empty stomach. Friday   bimatoprost (LUMIGAN) 0.01 % SOLN Place 1 drop into both eyes at bedtime.   brimonidine (ALPHAGAN) 0.2 % ophthalmic solution Place 1 drop into the right eye 3 (three) times daily. Wait 10 minutes between drops. (Keep eyes closed for 2 minutes after each drop)   chlorhexidine (PERIDEX) 0.12 %  solution Use as directed 5 mLs in the mouth or throat at bedtime. 1 tsp   Cholecalciferol (VITAMIN D3) 50 MCG (2000 UT) TABS Take 1 tablet by mouth daily.   cyanocobalamin  1000 MCG tablet Take 1,000 mcg by mouth in the morning.   docusate sodium (COLACE) 100 MG capsule Take 100 mg by mouth daily.   donepezil (ARICEPT) 10 MG tablet Take 10 mg by mouth at bedtime. (Patient taking differently: Take 5 mg by mouth at bedtime.)   dorzolamide-timolol (COSOPT) 22.3-6.8 MG/ML ophthalmic solution Place 1 drop into the right eye 2 (two) times daily. Wait 10 minutes between drops. (Keep eyes closed  for 2 minutes after each drop)   Iron , Ferrous Sulfate , 325 (65 Fe) MG TABS Take 325 mg by mouth daily.   lamoTRIgine  (LAMICTAL ) 100 MG tablet Take 1 tablet in morning, 1 and 1/2 tablets at night   memantine (NAMENDA) 5 MG tablet Take 10 mg by mouth 2 (two) times daily.   Multiple Vitamin (MULTIVITAMIN) tablet Take 1 tablet by mouth daily.   pantoprazole  (PROTONIX ) 40 MG tablet Take 1 tablet (40 mg total) by mouth daily.   pilocarpine (PILOCAR) 4 % ophthalmic solution Place 1 drop into the right eye 4 (four) times daily. Wait 10 minutes between drops. (Keep eyes closed for 2 minutes after each drop)   polyethylene glycol (MIRALAX ) 17 g packet Take 17 g by mouth every other day.   RHOPRESSA 0.02 % SOLN Apply 1 drop to eye every evening.   No facility-administered encounter medications on file as of 06/15/2024.    Review of Systems  Unable to perform ROS: Mental status change    Immunization History  Administered Date(s) Administered   Fluad Quad(high Dose 65+) 09/24/2023   Influenza, High Dose Seasonal PF 09/26/2015, 09/03/2017, 08/28/2018, 09/23/2020, 09/07/2022   Influenza,inj,Quad PF,6+ Mos 10/01/2019   Influenza-Unspecified 09/22/2009, 08/27/2011, 10/08/2012, 12/08/2014, 02/10/2018, 09/06/2021   Moderna Covid-19 Fall Seasonal Vaccine 55yrs & older 10/08/2022   Moderna Covid-19 Vaccine Bivalent Booster 38yrs & up 04/19/2022, 09/24/2023   Moderna Sars-Covid-2 Vaccination 12/14/2019, 01/12/2020, 09/20/2020, 10/13/2020, 10/08/2022   Pneumococcal Conjugate-13 12/31/2013   Pneumococcal Polysaccharide-23 12/04/1999, 02/02/2016   Td 12/04/2000   Tdap 11/07/2012, 01/18/2023   Zoster Recombinant(Shingrix) 04/01/2022, 06/01/2022   Pertinent  Health Maintenance Due  Topic Date Due   INFLUENZA VACCINE  07/03/2024   DEXA SCAN  Completed      04/11/2023   11:32 AM 08/13/2023   12:36 PM 12/31/2023    1:36 PM 04/14/2024    2:54 PM 05/08/2024   10:18 AM  Fall Risk  Falls in the past year? 1 1 1  1 1   Was there an injury with Fall? 1 1 0 1 1  Fall Risk Category Calculator 2 2 2 3 3   Patient at Risk for Falls Due to History of fall(s) History of fall(s);Impaired balance/gait History of fall(s);Impaired balance/gait History of fall(s);Impaired balance/gait History of fall(s);Impaired balance/gait  Fall risk Follow up Falls evaluation completed Falls evaluation completed;Education provided;Falls prevention discussed Falls evaluation completed;Education provided Falls evaluation completed Falls evaluation completed   Functional Status Survey:    Vitals:   06/15/24 1023  BP: (!) 152/76  Pulse: 75  Resp: 20  Temp: (!) 97.2 F (36.2 C)  SpO2: 96%  Weight: 133 lb 3.2 oz (60.4 kg)  Height: 5' 5 (1.651 m)   Body mass index is 22.17 kg/m. Physical Exam Vitals reviewed.  Constitutional:      Appearance: Normal appearance.  Comments: Unresponsive  HENT:     Head: Normocephalic.     Nose: Nose normal.     Mouth/Throat:     Mouth: Mucous membranes are moist.     Pharynx: Oropharynx is clear.  Eyes:     Pupils: Pupils are equal, round, and reactive to light.  Cardiovascular:     Rate and Rhythm: Normal rate and regular rhythm.     Pulses: Normal pulses.     Heart sounds: Normal heart sounds. No murmur heard. Pulmonary:     Effort: Pulmonary effort is normal.     Breath sounds: Normal breath sounds.  Abdominal:     General: Abdomen is flat. Bowel sounds are normal.     Palpations: Abdomen is soft.  Musculoskeletal:        General: No swelling.     Cervical back: Neck supple.  Skin:    General: Skin is warm.  Neurological:     Comments: Does not respond to Pain  Does move her extremeties  Psychiatric:        Mood and Affect: Mood normal.        Thought Content: Thought content normal.     Labs reviewed: Recent Labs    10/29/23 0000 03/30/24 0000  NA 142 139  K 4.0 3.9  CL 107 105  CO2 24* 21  BUN 23* 10  CREATININE 0.9 0.9  CALCIUM 8.2* 8.4*   Recent  Labs    10/29/23 0000 03/30/24 0000  AST 17 20  ALT 8 14  ALKPHOS 81 96  ALBUMIN 3.3* 3.7   Recent Labs    03/30/24 0000 04/02/24 0000 04/16/24 0000  WBC 5.3 6.0  6.0 6.5  HGB 8.7* 8.2*  8.2* 9.7*  HCT 27* 26*  26* 30*  PLT 230 217  217 222   Lab Results  Component Value Date   TSH 1.36 03/30/2024   No results found for: HGBA1C Lab Results  Component Value Date   CHOL 241 (A) 09/10/2019   HDL 99 (A) 09/10/2019   LDLCALC 128 09/10/2019   TRIG 72 09/10/2019    Significant Diagnostic Results in last 30 days:  No results found.  Assessment/Plan 1. Acute alteration in mental status (Primary) Most likely Acute Stroke Other differential includes Metabolic encephalopathy/Seizure  Hold all PO meds Hospice Referral Made  2. Seizure (HCC) Doe shave h/o Seizure but no Seizure activity Seen  3. Mixed Alzheimer's and vascular dementia (HCC) Hold all her meds  4. Anemia due to chronic blood loss Was on Iron  No Work up per [employer] staff Communication:   Labs/tests ordered:

## 2024-06-16 ENCOUNTER — Other Ambulatory Visit: Payer: Self-pay | Admitting: Internal Medicine

## 2024-06-16 MED ORDER — MORPHINE SULFATE (CONCENTRATE) 20 MG/ML PO SOLN: 5.0000 mg | ORAL | 0 refills | Status: DC | PRN

## 2024-06-16 MED ORDER — LORAZEPAM 0.5 MG PO TABS: 0.5000 mg | ORAL_TABLET | ORAL | 1 refills | Status: DC | PRN

## 2024-06-16 NOTE — Progress Notes (Signed)
 Patient continues to be [facility] Referral Pending Started on Morphine  and Ativan 

## 2024-06-25 ENCOUNTER — Telehealth: Payer: Self-pay | Admitting: Internal Medicine

## 2024-06-25 NOTE — Telephone Encounter (Signed)
 Done

## 2024-06-25 NOTE — Telephone Encounter (Signed)
 Copied from CRM [id number]. Topic: General - Deceased Patient >> Jul 23, 2024 10:13 AM Mercer PEDLAR wrote: Name of caller: Damien  Date of death: 2024/07/18   Name of funeral home: [employer] number of funeral home: [phone]  Provider that needs to sign form: Charlanne Fredia CROME, MD   Timeline for signing: As soon as possible. Today, 2024-07-23 is the fifth day waiting to be signed.   [place] system number: [phone]

## 2024-07-03 DEATH — deceased
